# Patient Record
Sex: Female | Born: 1970 | Race: White | Hispanic: No | Smoking: Current every day smoker
Health system: Southern US, Community
[De-identification: ages and names within clinical notes are randomized; demographics above are authoritative.]

## PROBLEM LIST (undated history)

## (undated) DIAGNOSIS — I219 Acute myocardial infarction, unspecified: Secondary | ICD-10-CM

## (undated) DIAGNOSIS — M797 Fibromyalgia: Secondary | ICD-10-CM

## (undated) DIAGNOSIS — N6320 Unspecified lump in the left breast, unspecified quadrant: Secondary | ICD-10-CM

## (undated) DIAGNOSIS — C229 Malignant neoplasm of liver, not specified as primary or secondary: Secondary | ICD-10-CM

## (undated) DIAGNOSIS — M1990 Unspecified osteoarthritis, unspecified site: Secondary | ICD-10-CM

## (undated) DIAGNOSIS — F319 Bipolar disorder, unspecified: Secondary | ICD-10-CM

## (undated) DIAGNOSIS — Z8659 Personal history of other mental and behavioral disorders: Secondary | ICD-10-CM

## (undated) DIAGNOSIS — K219 Gastro-esophageal reflux disease without esophagitis: Secondary | ICD-10-CM

## (undated) DIAGNOSIS — I10 Essential (primary) hypertension: Secondary | ICD-10-CM

## (undated) DIAGNOSIS — I2510 Atherosclerotic heart disease of native coronary artery without angina pectoris: Secondary | ICD-10-CM

## (undated) DIAGNOSIS — D499 Neoplasm of unspecified behavior of unspecified site: Secondary | ICD-10-CM

## (undated) DIAGNOSIS — C569 Malignant neoplasm of unspecified ovary: Secondary | ICD-10-CM

## (undated) DIAGNOSIS — F4310 Post-traumatic stress disorder, unspecified: Secondary | ICD-10-CM

## (undated) DIAGNOSIS — C3490 Malignant neoplasm of unspecified part of unspecified bronchus or lung: Secondary | ICD-10-CM

## (undated) DIAGNOSIS — K259 Gastric ulcer, unspecified as acute or chronic, without hemorrhage or perforation: Secondary | ICD-10-CM

## (undated) HISTORY — DX: Gastro-esophageal reflux disease without esophagitis: K21.9

## (undated) HISTORY — DX: Malignant neoplasm of unspecified ovary: C56.9

## (undated) HISTORY — PX: HAND SURGERY: SHX662

---

## 1998-03-15 ENCOUNTER — Emergency Department (HOSPITAL_COMMUNITY): Admission: EM | Admit: 1998-03-15 | Discharge: 1998-03-15 | Payer: Self-pay | Admitting: Emergency Medicine

## 1998-10-26 ENCOUNTER — Emergency Department (HOSPITAL_COMMUNITY): Admission: EM | Admit: 1998-10-26 | Discharge: 1998-10-26 | Payer: Self-pay | Admitting: Emergency Medicine

## 2000-09-13 ENCOUNTER — Encounter: Payer: Self-pay | Admitting: Emergency Medicine

## 2000-09-13 ENCOUNTER — Emergency Department (HOSPITAL_COMMUNITY): Admission: EM | Admit: 2000-09-13 | Discharge: 2000-09-13 | Payer: Self-pay | Admitting: Emergency Medicine

## 2000-09-20 ENCOUNTER — Encounter: Payer: Self-pay | Admitting: Internal Medicine

## 2000-09-20 ENCOUNTER — Ambulatory Visit (HOSPITAL_COMMUNITY): Admission: RE | Admit: 2000-09-20 | Discharge: 2000-09-20 | Payer: Self-pay | Admitting: Internal Medicine

## 2000-09-28 ENCOUNTER — Encounter: Payer: Self-pay | Admitting: Internal Medicine

## 2000-09-28 ENCOUNTER — Ambulatory Visit (HOSPITAL_COMMUNITY): Admission: RE | Admit: 2000-09-28 | Discharge: 2000-09-28 | Payer: Self-pay | Admitting: Internal Medicine

## 2000-11-13 ENCOUNTER — Emergency Department (HOSPITAL_COMMUNITY): Admission: EM | Admit: 2000-11-13 | Discharge: 2000-11-13 | Payer: Self-pay | Admitting: *Deleted

## 2001-02-24 ENCOUNTER — Emergency Department (HOSPITAL_COMMUNITY): Admission: EM | Admit: 2001-02-24 | Discharge: 2001-02-24 | Payer: Self-pay | Admitting: Emergency Medicine

## 2001-02-24 ENCOUNTER — Encounter: Payer: Self-pay | Admitting: Emergency Medicine

## 2001-08-14 ENCOUNTER — Emergency Department (HOSPITAL_COMMUNITY): Admission: EM | Admit: 2001-08-14 | Discharge: 2001-08-14 | Payer: Self-pay | Admitting: Internal Medicine

## 2001-08-27 ENCOUNTER — Emergency Department (HOSPITAL_COMMUNITY): Admission: EM | Admit: 2001-08-27 | Discharge: 2001-08-27 | Payer: Self-pay | Admitting: Emergency Medicine

## 2002-05-31 ENCOUNTER — Emergency Department (HOSPITAL_COMMUNITY): Admission: EM | Admit: 2002-05-31 | Discharge: 2002-05-31 | Payer: Self-pay | Admitting: Emergency Medicine

## 2004-08-28 ENCOUNTER — Emergency Department (HOSPITAL_COMMUNITY): Admission: EM | Admit: 2004-08-28 | Discharge: 2004-08-28 | Payer: Self-pay | Admitting: Emergency Medicine

## 2006-02-08 ENCOUNTER — Emergency Department (HOSPITAL_COMMUNITY): Admission: EM | Admit: 2006-02-08 | Discharge: 2006-02-08 | Payer: Self-pay | Admitting: Emergency Medicine

## 2006-07-29 ENCOUNTER — Emergency Department (HOSPITAL_COMMUNITY): Admission: EM | Admit: 2006-07-29 | Discharge: 2006-07-29 | Payer: Self-pay | Admitting: Emergency Medicine

## 2006-07-31 ENCOUNTER — Emergency Department (HOSPITAL_COMMUNITY): Admission: EM | Admit: 2006-07-31 | Discharge: 2006-07-31 | Payer: Self-pay | Admitting: Emergency Medicine

## 2006-08-01 ENCOUNTER — Emergency Department (HOSPITAL_COMMUNITY): Admission: EM | Admit: 2006-08-01 | Discharge: 2006-08-01 | Payer: Self-pay | Admitting: Emergency Medicine

## 2009-07-24 HISTORY — PX: BREAST LUMPECTOMY: SHX2

## 2009-07-24 HISTORY — PX: ABDOMINAL HYSTERECTOMY: SHX81

## 2010-04-17 ENCOUNTER — Emergency Department (HOSPITAL_COMMUNITY): Admission: EM | Admit: 2010-04-17 | Discharge: 2010-04-17 | Payer: Self-pay | Admitting: Emergency Medicine

## 2010-04-22 ENCOUNTER — Emergency Department (HOSPITAL_COMMUNITY): Admission: EM | Admit: 2010-04-22 | Discharge: 2010-04-22 | Payer: Self-pay | Admitting: Emergency Medicine

## 2010-06-27 ENCOUNTER — Emergency Department (HOSPITAL_COMMUNITY)
Admission: EM | Admit: 2010-06-27 | Discharge: 2010-06-27 | Payer: Self-pay | Source: Home / Self Care | Admitting: Emergency Medicine

## 2010-07-19 ENCOUNTER — Emergency Department (HOSPITAL_COMMUNITY)
Admission: EM | Admit: 2010-07-19 | Discharge: 2010-07-19 | Payer: Self-pay | Source: Home / Self Care | Admitting: Emergency Medicine

## 2010-08-03 ENCOUNTER — Emergency Department (HOSPITAL_COMMUNITY)
Admission: EM | Admit: 2010-08-03 | Discharge: 2010-08-03 | Payer: Medicaid Other | Source: Home / Self Care | Admitting: Emergency Medicine

## 2010-08-08 LAB — DIFFERENTIAL
Basophils Absolute: 0.1 10*3/uL (ref 0.0–0.1)
Basophils Relative: 1 % (ref 0–1)
Eosinophils Absolute: 0.3 10*3/uL (ref 0.0–0.7)
Eosinophils Relative: 5 % (ref 0–5)
Lymphocytes Relative: 43 % (ref 12–46)
Lymphs Abs: 2.5 10*3/uL (ref 0.7–4.0)
Monocytes Absolute: 0.5 10*3/uL (ref 0.1–1.0)
Monocytes Relative: 8 % (ref 3–12)
Neutro Abs: 2.4 10*3/uL (ref 1.7–7.7)
Neutrophils Relative %: 43 % (ref 43–77)

## 2010-08-08 LAB — CBC
HCT: 36.1 % (ref 36.0–46.0)
Hemoglobin: 12.6 g/dL (ref 12.0–15.0)
MCH: 32.3 pg (ref 26.0–34.0)
MCHC: 34.9 g/dL (ref 30.0–36.0)
MCV: 92.6 fL (ref 78.0–100.0)
Platelets: 237 10*3/uL (ref 150–400)
RBC: 3.9 MIL/uL (ref 3.87–5.11)
RDW: 13.3 % (ref 11.5–15.5)
WBC: 5.7 10*3/uL (ref 4.0–10.5)

## 2010-08-08 LAB — COMPREHENSIVE METABOLIC PANEL
ALT: 24 U/L (ref 0–35)
AST: 21 U/L (ref 0–37)
Albumin: 3.6 g/dL (ref 3.5–5.2)
Alkaline Phosphatase: 58 U/L (ref 39–117)
BUN: 9 mg/dL (ref 6–23)
CO2: 28 mEq/L (ref 19–32)
Calcium: 8.7 mg/dL (ref 8.4–10.5)
Chloride: 107 mEq/L (ref 96–112)
Creatinine, Ser: 0.9 mg/dL (ref 0.4–1.2)
GFR calc Af Amer: >60 mL/min (ref >60)
GFR calc non Af Amer: >60 mL/min (ref >60)
Glucose, Bld: 85 mg/dL (ref 70–99)
Potassium: 3.8 mEq/L (ref 3.5–5.1)
Sodium: 142 mEq/L (ref 135–145)
Total Bilirubin: 0.3 mg/dL (ref 0.3–1.2)
Total Protein: 5.8 g/dL — ABNORMAL LOW (ref 6.0–8.3)

## 2010-08-08 LAB — POCT CARDIAC MARKERS
CKMB, poc: 1 ng/mL (ref 1.0–8.0)
Myoglobin, poc: 40.1 ng/mL (ref 12–200)
Troponin i, poc: <0.05 ng/mL (ref 0.00–0.09)

## 2010-08-25 ENCOUNTER — Emergency Department (HOSPITAL_COMMUNITY)
Admission: EM | Admit: 2010-08-25 | Discharge: 2010-08-25 | Disposition: A | Discharge status: Home / Self Care | Payer: Self-pay | Attending: Emergency Medicine | Admitting: Emergency Medicine

## 2010-08-25 ENCOUNTER — Emergency Department (HOSPITAL_COMMUNITY): Admit: 2010-08-25 | Discharge: 2010-08-25 | Disposition: A | Discharge status: Home / Self Care | Payer: Self-pay

## 2010-08-25 DIAGNOSIS — IMO0001 Reserved for inherently not codable concepts without codable children: Secondary | ICD-10-CM | POA: Insufficient documentation

## 2010-08-25 DIAGNOSIS — G43909 Migraine, unspecified, not intractable, without status migrainosus: Secondary | ICD-10-CM | POA: Insufficient documentation

## 2010-08-25 DIAGNOSIS — M069 Rheumatoid arthritis, unspecified: Secondary | ICD-10-CM | POA: Insufficient documentation

## 2010-08-25 DIAGNOSIS — F4310 Post-traumatic stress disorder, unspecified: Secondary | ICD-10-CM | POA: Insufficient documentation

## 2010-08-25 DIAGNOSIS — M329 Systemic lupus erythematosus, unspecified: Secondary | ICD-10-CM | POA: Insufficient documentation

## 2010-08-25 DIAGNOSIS — Z79899 Other long term (current) drug therapy: Secondary | ICD-10-CM | POA: Insufficient documentation

## 2010-09-26 ENCOUNTER — Emergency Department (HOSPITAL_COMMUNITY)
Admission: EM | Admit: 2010-09-26 | Discharge: 2010-09-26 | Disposition: A | Discharge status: Home / Self Care | Payer: Self-pay | Attending: Emergency Medicine | Admitting: Emergency Medicine

## 2010-09-26 DIAGNOSIS — M329 Systemic lupus erythematosus, unspecified: Secondary | ICD-10-CM | POA: Insufficient documentation

## 2010-09-26 DIAGNOSIS — IMO0001 Reserved for inherently not codable concepts without codable children: Secondary | ICD-10-CM | POA: Insufficient documentation

## 2010-09-26 DIAGNOSIS — Z79899 Other long term (current) drug therapy: Secondary | ICD-10-CM | POA: Insufficient documentation

## 2010-09-26 DIAGNOSIS — G43909 Migraine, unspecified, not intractable, without status migrainosus: Secondary | ICD-10-CM | POA: Insufficient documentation

## 2010-10-19 ENCOUNTER — Emergency Department (HOSPITAL_COMMUNITY): Payer: Self-pay

## 2010-10-19 ENCOUNTER — Emergency Department (HOSPITAL_COMMUNITY)
Admission: EM | Admit: 2010-10-19 | Discharge: 2010-10-19 | Disposition: A | Discharge status: Home / Self Care | Payer: Self-pay | Attending: Emergency Medicine | Admitting: Emergency Medicine

## 2010-10-19 DIAGNOSIS — R079 Chest pain, unspecified: Secondary | ICD-10-CM | POA: Insufficient documentation

## 2010-10-19 DIAGNOSIS — IMO0001 Reserved for inherently not codable concepts without codable children: Secondary | ICD-10-CM | POA: Insufficient documentation

## 2010-10-19 DIAGNOSIS — F411 Generalized anxiety disorder: Secondary | ICD-10-CM | POA: Insufficient documentation

## 2010-10-19 DIAGNOSIS — R911 Solitary pulmonary nodule: Secondary | ICD-10-CM | POA: Insufficient documentation

## 2010-10-19 DIAGNOSIS — J40 Bronchitis, not specified as acute or chronic: Secondary | ICD-10-CM | POA: Insufficient documentation

## 2010-10-19 LAB — CBC
HCT: 40.1 % (ref 36.0–46.0)
Hemoglobin: 13.7 g/dL (ref 12.0–15.0)
MCH: 32.2 pg (ref 26.0–34.0)
MCHC: 34.2 g/dL (ref 30.0–36.0)
MCV: 94.4 fL (ref 78.0–100.0)
Platelets: 382 10*3/uL (ref 150–400)
RBC: 4.25 MIL/uL (ref 3.87–5.11)
RDW: 13.1 % (ref 11.5–15.5)
WBC: 6.8 10*3/uL (ref 4.0–10.5)

## 2010-10-19 LAB — HEPATIC FUNCTION PANEL
ALT: 11 U/L (ref 0–35)
AST: 9 U/L (ref 0–37)
Albumin: 3.7 g/dL (ref 3.5–5.2)
Alkaline Phosphatase: 61 U/L (ref 39–117)
Bilirubin, Direct: 0.1 mg/dL (ref 0.0–0.3)
Indirect Bilirubin: 0.2 mg/dL — ABNORMAL LOW (ref 0.3–0.9)
Total Bilirubin: 0.3 mg/dL (ref 0.3–1.2)
Total Protein: 5.9 g/dL — ABNORMAL LOW (ref 6.0–8.3)

## 2010-10-19 LAB — URINALYSIS, ROUTINE W REFLEX MICROSCOPIC
Bilirubin Urine: NEGATIVE
Glucose, UA: NEGATIVE mg/dL
Hgb urine dipstick: NEGATIVE
Ketones, ur: NEGATIVE mg/dL
Nitrite: NEGATIVE
Protein, ur: NEGATIVE mg/dL
Specific Gravity, Urine: 1.025 (ref 1.005–1.030)
Urobilinogen, UA: 0.2 mg/dL (ref 0.0–1.0)
pH: 5.5 (ref 5.0–8.0)

## 2010-10-19 LAB — POCT CARDIAC MARKERS
CKMB, poc: <1 ng/mL — ABNORMAL LOW (ref 1.0–8.0)
Myoglobin, poc: 34.9 ng/mL (ref 12–200)
Troponin i, poc: <0.05 ng/mL (ref 0.00–0.09)

## 2010-10-19 LAB — DIFFERENTIAL
Basophils Absolute: 0.1 10*3/uL (ref 0.0–0.1)
Basophils Relative: 2 % — ABNORMAL HIGH (ref 0–1)
Eosinophils Absolute: 0.5 10*3/uL (ref 0.0–0.7)
Eosinophils Relative: 7 % — ABNORMAL HIGH (ref 0–5)
Lymphocytes Relative: 41 % (ref 12–46)
Lymphs Abs: 2.8 10*3/uL (ref 0.7–4.0)
Monocytes Absolute: 0.7 10*3/uL (ref 0.1–1.0)
Monocytes Relative: 10 % (ref 3–12)
Neutro Abs: 2.7 10*3/uL (ref 1.7–7.7)
Neutrophils Relative %: 40 % — ABNORMAL LOW (ref 43–77)

## 2010-10-19 LAB — BASIC METABOLIC PANEL
BUN: 16 mg/dL (ref 6–23)
CO2: 27 mEq/L (ref 19–32)
Calcium: 8.9 mg/dL (ref 8.4–10.5)
Chloride: 105 mEq/L (ref 96–112)
Creatinine, Ser: 1.1 mg/dL (ref 0.4–1.2)
GFR calc Af Amer: >60 mL/min (ref >60)
GFR calc non Af Amer: 55 mL/min — ABNORMAL LOW (ref >60)
Glucose, Bld: 75 mg/dL (ref 70–99)
Potassium: 3.6 mEq/L (ref 3.5–5.1)
Sodium: 139 mEq/L (ref 135–145)

## 2010-10-19 LAB — LIPASE, BLOOD: Lipase: 23 U/L (ref 11–59)

## 2010-10-19 LAB — CK TOTAL AND CKMB (NOT AT ARMC)
CK, MB: 1.3 ng/mL (ref 0.3–4.0)
Relative Index: INVALID (ref 0.0–2.5)
Total CK: 36 U/L (ref 7–177)

## 2010-10-19 LAB — TROPONIN I: Troponin I: 0.01 ng/mL (ref 0.00–0.06)

## 2010-10-19 MED ORDER — IOHEXOL 350 MG/ML SOLN
100.0000 mL | Freq: Once | INTRAVENOUS | Status: AC | PRN
  Administered 2010-10-19: 100 mL via INTRAVENOUS

## 2010-12-09 NOTE — Assessment & Plan Note (Signed)
Friday, June 22, 2006:   This is a followup bilateral hamstring Botox injection, as well as pain  management related to lower extremity spasticity as well as migraine  headaches. She did well with the Botox injection, in terms of relieving  lower extremity spasms. She feels a little funny in terms of her  walking, but she has had no falls.   She has had no medical complications. She had a prescription for 32 mg  of Dilaudid dated May 04, 2006. She just ran out a week ago. She has  been reducing her consumption secondary to the beneficial effect in  Botox this far.   Pain level about 5-6 out of 10. Sleep continues to be poor. She can walk  40 minutes at a time. No multiple sclerosis flare-ups in the interval  time. Independent with self-care and mobility.   EXAMINATION:  General: No acute distress. Mood and affect appropriate.  [place] 1 for the hamstrings.   She has weakness of the right ankle dorsi flexor as well as right left  flexor compared to the left.   Upper extremity strength is good. Gait does have some right foot drag.   IMPRESSION:  1. Multiple sclerosis with hamstring spasticity, improved. Some mild      increase in weakness following Botox injection.  2. Right foot drop. Recommend AFO.  3. Pain related to her spasticity as well as headaches, intermittent      low-dose Dilaudid, 30 tablets last her four to six weeks. Will      refill Dilaudid 2 mg #30. I will see her back the beginning of      January.      Erick Colace, M.D.  Electronically Signed     AEK/MedQ  D:  06/22/2006 08:59:15  T:  06/22/2006 10:57:29  Job #:  [id number]   cc:   Casimiro Needle L. Thad Ranger, M.D.  Fax: [phone]   Dalbert Mayotte, M.D.

## 2011-01-04 ENCOUNTER — Emergency Department (HOSPITAL_COMMUNITY)
Admission: EM | Admit: 2011-01-04 | Discharge: 2011-01-04 | Disposition: A | Discharge status: Home / Self Care | Payer: Medicaid Other | Attending: Emergency Medicine | Admitting: Emergency Medicine

## 2011-01-04 DIAGNOSIS — R609 Edema, unspecified: Secondary | ICD-10-CM | POA: Insufficient documentation

## 2011-01-04 DIAGNOSIS — M329 Systemic lupus erythematosus, unspecified: Secondary | ICD-10-CM | POA: Insufficient documentation

## 2011-01-04 DIAGNOSIS — I252 Old myocardial infarction: Secondary | ICD-10-CM | POA: Insufficient documentation

## 2011-01-04 DIAGNOSIS — X118XXA Contact with other hot tap-water, initial encounter: Secondary | ICD-10-CM | POA: Insufficient documentation

## 2011-01-04 DIAGNOSIS — S59909A Unspecified injury of unspecified elbow, initial encounter: Secondary | ICD-10-CM | POA: Insufficient documentation

## 2011-01-04 DIAGNOSIS — T23179A Burn of first degree of unspecified wrist, initial encounter: Secondary | ICD-10-CM | POA: Insufficient documentation

## 2011-01-04 DIAGNOSIS — T23169A Burn of first degree of back of unspecified hand, initial encounter: Secondary | ICD-10-CM | POA: Insufficient documentation

## 2011-01-04 DIAGNOSIS — M069 Rheumatoid arthritis, unspecified: Secondary | ICD-10-CM | POA: Insufficient documentation

## 2011-01-04 DIAGNOSIS — S6990XA Unspecified injury of unspecified wrist, hand and finger(s), initial encounter: Secondary | ICD-10-CM | POA: Insufficient documentation

## 2011-01-06 ENCOUNTER — Emergency Department (HOSPITAL_COMMUNITY)
Admission: EM | Admit: 2011-01-06 | Discharge: 2011-01-06 | Disposition: A | Discharge status: Home / Self Care | Payer: Medicaid Other | Attending: Emergency Medicine | Admitting: Emergency Medicine

## 2011-01-06 DIAGNOSIS — Y998 Other external cause status: Secondary | ICD-10-CM | POA: Insufficient documentation

## 2011-01-06 DIAGNOSIS — X088XXA Exposure to other specified smoke, fire and flames, initial encounter: Secondary | ICD-10-CM | POA: Insufficient documentation

## 2011-01-06 DIAGNOSIS — T23109A Burn of first degree of unspecified hand, unspecified site, initial encounter: Secondary | ICD-10-CM | POA: Insufficient documentation

## 2011-01-07 ENCOUNTER — Emergency Department (HOSPITAL_COMMUNITY)
Admission: EM | Admit: 2011-01-07 | Discharge: 2011-01-07 | Disposition: A | Discharge status: Home / Self Care | Payer: Medicaid Other | Attending: Emergency Medicine | Admitting: Emergency Medicine

## 2011-01-07 DIAGNOSIS — IMO0001 Reserved for inherently not codable concepts without codable children: Secondary | ICD-10-CM | POA: Insufficient documentation

## 2011-01-07 DIAGNOSIS — IMO0002 Reserved for concepts with insufficient information to code with codable children: Secondary | ICD-10-CM | POA: Insufficient documentation

## 2011-01-07 DIAGNOSIS — Z882 Allergy status to sulfonamides status: Secondary | ICD-10-CM | POA: Insufficient documentation

## 2011-01-07 DIAGNOSIS — Z888 Allergy status to other drugs, medicaments and biological substances status: Secondary | ICD-10-CM | POA: Insufficient documentation

## 2011-01-07 DIAGNOSIS — F411 Generalized anxiety disorder: Secondary | ICD-10-CM | POA: Insufficient documentation

## 2011-01-07 DIAGNOSIS — T23009A Burn of unspecified degree of unspecified hand, unspecified site, initial encounter: Secondary | ICD-10-CM | POA: Insufficient documentation

## 2011-01-07 DIAGNOSIS — F17200 Nicotine dependence, unspecified, uncomplicated: Secondary | ICD-10-CM | POA: Insufficient documentation

## 2011-01-07 DIAGNOSIS — I252 Old myocardial infarction: Secondary | ICD-10-CM | POA: Insufficient documentation

## 2011-01-07 DIAGNOSIS — Z9079 Acquired absence of other genital organ(s): Secondary | ICD-10-CM | POA: Insufficient documentation

## 2011-01-07 DIAGNOSIS — X118XXA Contact with other hot tap-water, initial encounter: Secondary | ICD-10-CM | POA: Insufficient documentation

## 2011-01-10 ENCOUNTER — Emergency Department (HOSPITAL_COMMUNITY)
Admission: EM | Admit: 2011-01-10 | Discharge: 2011-01-10 | Disposition: A | Discharge status: Home / Self Care | Payer: Medicaid Other | Attending: Emergency Medicine | Admitting: Emergency Medicine

## 2011-01-10 DIAGNOSIS — IMO0001 Reserved for inherently not codable concepts without codable children: Secondary | ICD-10-CM | POA: Insufficient documentation

## 2011-01-10 DIAGNOSIS — F411 Generalized anxiety disorder: Secondary | ICD-10-CM | POA: Insufficient documentation

## 2011-01-10 DIAGNOSIS — T23109A Burn of first degree of unspecified hand, unspecified site, initial encounter: Secondary | ICD-10-CM | POA: Insufficient documentation

## 2011-01-10 DIAGNOSIS — T310 Burns involving less than 10% of body surface: Secondary | ICD-10-CM | POA: Insufficient documentation

## 2011-01-10 DIAGNOSIS — G43909 Migraine, unspecified, not intractable, without status migrainosus: Secondary | ICD-10-CM | POA: Insufficient documentation

## 2011-01-10 DIAGNOSIS — X088XXA Exposure to other specified smoke, fire and flames, initial encounter: Secondary | ICD-10-CM | POA: Insufficient documentation

## 2011-01-10 DIAGNOSIS — M069 Rheumatoid arthritis, unspecified: Secondary | ICD-10-CM | POA: Insufficient documentation

## 2011-01-10 DIAGNOSIS — F17200 Nicotine dependence, unspecified, uncomplicated: Secondary | ICD-10-CM | POA: Insufficient documentation

## 2011-01-10 DIAGNOSIS — F1911 Other psychoactive substance abuse, in remission: Secondary | ICD-10-CM | POA: Insufficient documentation

## 2011-01-10 DIAGNOSIS — IMO0002 Reserved for concepts with insufficient information to code with codable children: Secondary | ICD-10-CM | POA: Insufficient documentation

## 2011-01-10 DIAGNOSIS — F4310 Post-traumatic stress disorder, unspecified: Secondary | ICD-10-CM | POA: Insufficient documentation

## 2011-02-02 ENCOUNTER — Emergency Department: Payer: Self-pay | Admitting: Emergency Medicine

## 2011-02-08 ENCOUNTER — Encounter: Payer: Self-pay | Admitting: *Deleted

## 2011-02-08 ENCOUNTER — Emergency Department (HOSPITAL_COMMUNITY)
Admission: EM | Admit: 2011-02-08 | Discharge: 2011-02-08 | Disposition: A | Discharge status: Home / Self Care | Payer: Medicaid Other | Attending: Emergency Medicine | Admitting: Emergency Medicine

## 2011-02-08 DIAGNOSIS — R5381 Other malaise: Secondary | ICD-10-CM | POA: Insufficient documentation

## 2011-02-08 DIAGNOSIS — R5383 Other fatigue: Secondary | ICD-10-CM | POA: Insufficient documentation

## 2011-02-08 DIAGNOSIS — F17200 Nicotine dependence, unspecified, uncomplicated: Secondary | ICD-10-CM | POA: Insufficient documentation

## 2011-02-08 DIAGNOSIS — F319 Bipolar disorder, unspecified: Secondary | ICD-10-CM | POA: Insufficient documentation

## 2011-02-08 DIAGNOSIS — Z765 Malingerer [conscious simulation]: Secondary | ICD-10-CM | POA: Insufficient documentation

## 2011-02-08 HISTORY — DX: Unspecified osteoarthritis, unspecified site: M19.90

## 2011-02-08 HISTORY — DX: Bipolar disorder, unspecified: F31.9

## 2011-02-08 HISTORY — DX: Post-traumatic stress disorder, unspecified: F43.10

## 2011-02-08 HISTORY — DX: Unspecified lump in the left breast, unspecified quadrant: N63.20

## 2011-02-08 HISTORY — DX: Fibromyalgia: M79.7

## 2011-02-08 LAB — URINALYSIS, ROUTINE W REFLEX MICROSCOPIC
Leukocytes, UA: NEGATIVE
Protein, ur: NEGATIVE mg/dL
Urobilinogen, UA: 0.2 mg/dL (ref 0.0–1.0)

## 2011-02-08 LAB — BASIC METABOLIC PANEL
Calcium: 8.9 mg/dL (ref 8.4–10.5)
GFR calc non Af Amer: >60 mL/min (ref >60)
Sodium: 140 mEq/L (ref 135–145)

## 2011-02-08 LAB — URINE MICROSCOPIC-ADD ON

## 2011-02-08 LAB — POCT PREGNANCY, URINE: Preg Test, Ur: NEGATIVE

## 2011-02-08 LAB — PREGNANCY, URINE: Preg Test, Ur: NEGATIVE

## 2011-02-08 LAB — ETHANOL: Alcohol, Ethyl (B): <11 mg/dL (ref 0–11)

## 2011-02-08 MED ORDER — ONDANSETRON HCL 4 MG PO TABS
4.0000 mg | ORAL_TABLET | Freq: Once | ORAL | Status: AC
  Administered 2011-02-08: 11:00:00 via ORAL

## 2011-02-08 MED ORDER — ONDANSETRON HCL 4 MG PO TABS
ORAL_TABLET | ORAL | Status: AC
  Filled 2011-02-08: qty 1

## 2011-02-08 MED ORDER — ACETAMINOPHEN 325 MG PO TABS
650.0000 mg | ORAL_TABLET | Freq: Once | ORAL | Status: AC
  Administered 2011-02-08: 650 mg via ORAL
  Filled 2011-02-08: qty 2

## 2011-02-08 MED ORDER — ONDANSETRON 8 MG PO TBDP
8.0000 mg | ORAL_TABLET | Freq: Once | ORAL | Status: AC
  Administered 2011-02-08: 8 mg via ORAL
  Filled 2011-02-08: qty 1

## 2011-02-08 MED ORDER — ARIPIPRAZOLE 5 MG PO TABS: 5.0000 mg | ORAL_TABLET | Freq: Every day | ORAL | Status: AC

## 2011-02-08 NOTE — ED Notes (Signed)
Pt states that she needs to be evaluated for inpatient treatment for her PTSD. Pt states that she has been off all her meds since getting out of [place] in April. Pt states that she has been nervous and feels like she is having a nervous breakdown. Pt admits to suicidal ideations but has no plan. Pt also c/o depression. Has not been able to eat or sleep and has lost weight.

## 2011-02-08 NOTE — ED Notes (Signed)
Specialists on call was contacted. Psychiatry consult was set up. Awaiting a return call.

## 2011-02-08 NOTE — ED Notes (Signed)
Telepsych consult completed. Pt upset at this time. Pt was informed she did not meet criteria for inpatient psychiatric treatment. Pt stated she was going outside to smoke. Explained to pt that this was non smoking facility and that was not allowed. Pt insisted she was going to smoke and she would be back when she was done. PT ambulated out of facility. Belongings remain in room. PA and charge nurse aware. PA awaiting report from psychiatrist.

## 2011-02-08 NOTE — ED Provider Notes (Signed)
History     Chief Complaint  Patient presents with  . Psychiatric Evaluation   HPI Comments: She describes having run out of of her psych meds the end of last month and is experiencing increased stress and depression.  She relates multiple medical diagnoses which have caused her great distress in addition to having ptsd since the death of an infant child and her boyfriend years ago.  She is currently married,  But not in a happy marriage,  Claiming husband has etoh problems.  She is wanting to be "voluntarily" committed today to get back on her psych meds.  She denies suicidal or homicidal ideation.  She is scheduled to see her provider at [facility] next week.  Patient is a 40 y.o. female presenting with mental health disorder. The history is provided by the patient.  Mental Health Problem The primary symptoms do not include hallucinations. The current episode started more than 1 month ago. This is a chronic problem.  The onset of the illness is precipitated by a stressful event and emotional stress. Additional symptoms of the illness include fatigue and feelings of worthlessness. Additional symptoms of the illness do not include no insomnia, no flight of ideas, not distractible, no poor judgment, no visual change, no headaches or no abdominal pain. She does not admit to suicidal ideas. She does not have a plan to commit suicide. She does not contemplate harming herself. She has not already injured self. She does not contemplate injuring another person. She has not already  injured another person.    Past Medical History  Diagnosis Date  . Bipolar 1 disorder   . PTSD (post-traumatic stress disorder)   . Fibromyalgia   . Mass of left breast   . Arthritis     Past Surgical History  Procedure Date  . Abdominal hysterectomy     History reviewed. No pertinent family history.  History  Substance Use Topics  . Smoking status: Current Everyday Smoker -- 1.0 packs/day    Types:  Cigarettes  . Smokeless tobacco: Not on file  . Alcohol Use: No    OB History    Grav Para Term Preterm Abortions TAB SAB Ect Mult Living                  Review of Systems  Constitutional: Positive for fatigue. Negative for fever.  HENT: Negative for congestion, sore throat and neck pain.   Eyes: Negative.   Respiratory: Negative for chest tightness and shortness of breath.   Cardiovascular: Negative for chest pain.  Gastrointestinal: Negative for nausea and abdominal pain.  Genitourinary: Negative.   Musculoskeletal: Negative for joint swelling and arthralgias.  Skin: Negative.  Negative for rash and wound.  Neurological: Negative for dizziness, tremors, weakness, light-headedness, numbness and headaches.  Hematological: Negative.   Psychiatric/Behavioral: Negative.  Negative for suicidal ideas, hallucinations and self-injury. The patient does not have insomnia.   All other systems reviewed and are negative.    Physical Exam  BP 98/56  Pulse 79  Temp(Src) 98.3 F (36.8 C) (Oral)  Resp 18  Ht 5' 2.5" (1.588 m)  Wt 110 lb (49.896 kg)  BMI 19.80 kg/m2  SpO2 98%  Physical Exam  Vitals reviewed. Constitutional: She is oriented to person, place, and time. She appears well-developed and well-nourished.  HENT:  Head: Normocephalic and atraumatic.  Eyes: Conjunctivae are normal.  Neck: Normal range of motion.  Cardiovascular: Normal rate, regular rhythm, normal heart sounds and intact distal pulses.  Pulmonary/Chest: Effort normal and breath sounds normal. She has no wheezes.  Abdominal: Soft. Bowel sounds are normal. There is no tenderness.  Musculoskeletal: Normal range of motion.  Neurological: She is alert and oriented to person, place, and time.  Skin: Skin is warm and dry.  Psychiatric: She has a normal mood and affect. Her speech is normal and behavior is normal. Thought content normal. Cognition and memory are normal.       Occasionally tearful during interview.     ED Course  Procedures  MDM   Telepsych services consulted and Debbora Presto,  MD evaluated patient with diagnosis of bipolar disorder and suspected malingering without suicidality or homicidality.  Safe for outpt tx.  Did recommend restarting abilify 5 mg po qd.        Candis Musa, PA 02/08/11 2109

## 2011-02-28 ENCOUNTER — Other Ambulatory Visit (HOSPITAL_COMMUNITY): Payer: Self-pay | Admitting: Family Medicine

## 2011-02-28 DIAGNOSIS — Z139 Encounter for screening, unspecified: Secondary | ICD-10-CM

## 2011-02-28 DIAGNOSIS — N6459 Other signs and symptoms in breast: Secondary | ICD-10-CM

## 2011-02-28 DIAGNOSIS — G8929 Other chronic pain: Secondary | ICD-10-CM

## 2011-03-11 ENCOUNTER — Emergency Department (HOSPITAL_COMMUNITY)
Admission: EM | Admit: 2011-03-11 | Discharge: 2011-03-11 | Disposition: A | Discharge status: Home / Self Care | Payer: Medicaid Other | Attending: Emergency Medicine | Admitting: Emergency Medicine

## 2011-03-11 DIAGNOSIS — L02519 Cutaneous abscess of unspecified hand: Secondary | ICD-10-CM | POA: Insufficient documentation

## 2011-03-11 DIAGNOSIS — L03119 Cellulitis of unspecified part of limb: Secondary | ICD-10-CM | POA: Insufficient documentation

## 2011-03-11 DIAGNOSIS — IMO0001 Reserved for inherently not codable concepts without codable children: Secondary | ICD-10-CM | POA: Insufficient documentation

## 2011-03-11 DIAGNOSIS — M069 Rheumatoid arthritis, unspecified: Secondary | ICD-10-CM | POA: Insufficient documentation

## 2011-03-11 DIAGNOSIS — S61409A Unspecified open wound of unspecified hand, initial encounter: Secondary | ICD-10-CM | POA: Insufficient documentation

## 2011-03-11 DIAGNOSIS — IMO0002 Reserved for concepts with insufficient information to code with codable children: Secondary | ICD-10-CM | POA: Insufficient documentation

## 2011-03-11 DIAGNOSIS — X58XXXA Exposure to other specified factors, initial encounter: Secondary | ICD-10-CM | POA: Insufficient documentation

## 2011-03-15 NOTE — ED Provider Notes (Signed)
Medical screening examination/treatment/procedure(s) were performed by non-physician practitioner and as supervising physician I was immediately available for consultation/collaboration.   Askari Kinley L Tationna Fullard, MD 03/15/11 0957 

## 2011-03-22 ENCOUNTER — Ambulatory Visit (HOSPITAL_COMMUNITY): Admission: RE | Admit: 2011-03-22 | Payer: Self-pay | Source: Ambulatory Visit

## 2011-04-21 ENCOUNTER — Emergency Department (HOSPITAL_COMMUNITY): Payer: Medicaid Other

## 2011-04-21 ENCOUNTER — Encounter (HOSPITAL_COMMUNITY): Payer: Self-pay | Admitting: *Deleted

## 2011-04-21 ENCOUNTER — Other Ambulatory Visit: Payer: Self-pay

## 2011-04-21 ENCOUNTER — Emergency Department (HOSPITAL_COMMUNITY)
Admission: EM | Admit: 2011-04-21 | Discharge: 2011-04-22 | Disposition: A | Discharge status: Home / Self Care | Payer: Medicaid Other | Attending: Emergency Medicine | Admitting: Emergency Medicine

## 2011-04-21 DIAGNOSIS — F4310 Post-traumatic stress disorder, unspecified: Secondary | ICD-10-CM | POA: Insufficient documentation

## 2011-04-21 DIAGNOSIS — Z79899 Other long term (current) drug therapy: Secondary | ICD-10-CM | POA: Insufficient documentation

## 2011-04-21 DIAGNOSIS — F17200 Nicotine dependence, unspecified, uncomplicated: Secondary | ICD-10-CM | POA: Insufficient documentation

## 2011-04-21 DIAGNOSIS — IMO0001 Reserved for inherently not codable concepts without codable children: Secondary | ICD-10-CM | POA: Insufficient documentation

## 2011-04-21 DIAGNOSIS — F319 Bipolar disorder, unspecified: Secondary | ICD-10-CM | POA: Insufficient documentation

## 2011-04-21 DIAGNOSIS — R0789 Other chest pain: Secondary | ICD-10-CM

## 2011-04-21 DIAGNOSIS — R51 Headache: Secondary | ICD-10-CM | POA: Insufficient documentation

## 2011-04-21 DIAGNOSIS — I252 Old myocardial infarction: Secondary | ICD-10-CM | POA: Insufficient documentation

## 2011-04-21 DIAGNOSIS — R079 Chest pain, unspecified: Secondary | ICD-10-CM | POA: Insufficient documentation

## 2011-04-21 LAB — CBC
Hemoglobin: 13.6 g/dL (ref 12.0–15.0)
MCH: 32.1 pg (ref 26.0–34.0)
MCV: 95.3 fL (ref 78.0–100.0)
RBC: 4.24 MIL/uL (ref 3.87–5.11)

## 2011-04-21 LAB — DIFFERENTIAL
Eosinophils Absolute: 0.7 10*3/uL (ref 0.0–0.7)
Lymphocytes Relative: 44 % (ref 12–46)
Lymphs Abs: 3.5 10*3/uL (ref 0.7–4.0)
Monocytes Relative: 8 % (ref 3–12)
Neutrophils Relative %: 38 % — ABNORMAL LOW (ref 43–77)

## 2011-04-21 LAB — BASIC METABOLIC PANEL
BUN: 11 mg/dL (ref 6–23)
CO2: 31 mEq/L (ref 19–32)
GFR calc non Af Amer: >60 mL/min (ref >60)
Glucose, Bld: 90 mg/dL (ref 70–99)
Potassium: 3.8 mEq/L (ref 3.5–5.1)

## 2011-04-21 LAB — POCT I-STAT TROPONIN I: Troponin i, poc: 0 ng/mL (ref 0.00–0.08)

## 2011-04-21 MED ORDER — ONDANSETRON HCL 4 MG/2ML IJ SOLN
4.0000 mg | Freq: Once | INTRAMUSCULAR | Status: AC
  Administered 2011-04-21: 4 mg via INTRAVENOUS
  Filled 2011-04-21: qty 2

## 2011-04-21 MED ORDER — MORPHINE SULFATE 4 MG/ML IJ SOLN
4.0000 mg | Freq: Once | INTRAMUSCULAR | Status: AC
  Administered 2011-04-21: 4 mg via INTRAVENOUS
  Filled 2011-04-21: qty 1

## 2011-04-21 NOTE — ED Notes (Signed)
Chest pain for 2 days,  Headache for 1 week.

## 2011-04-21 NOTE — ED Notes (Signed)
Pt states has had chest pain with sob  x 2 days, denies N/V  Pt reports pain moving to upper lt arm and jaw pain "a couple of times over past 2 days". Pt states feels like "someone is squeezing my heart". Denies diaphoresis with these pains. Pt also complaining of headache at this time.

## 2011-04-21 NOTE — ED Notes (Signed)
hematuria

## 2011-04-21 NOTE — ED Provider Notes (Addendum)
History     CSN: [id number] Arrival date & time: 04/21/2011  9:33 PM  Chief Complaint  Patient presents with  . Chest Pain    (Consider location/radiation/quality/duration/timing/severity/associated sxs/prior treatment) Patient is a 40 y.o. female presenting with chest pain. The history is provided by the patient.  Chest Pain The chest pain began yesterday. Duration of episode(s) is 1 minute. Chest pain occurs intermittently. The chest pain is resolved. Associated with: headache. At its most intense, the pain is at 5/10. The pain is currently at 3/10. The severity of the pain is moderate. The quality of the pain is described as squeezing. Radiates to: She reports 3 episodes of chest pain at rest today,  each lasting 1 minute or less.  The last episode occured 2 hours ago and radiated to her left elbow. Pertinent negatives for primary symptoms include no fever, no shortness of breath, no cough, no wheezing, no nausea, no vomiting and no dizziness.  Pertinent negatives for associated symptoms include no diaphoresis, no orthopnea and no weakness. Associated symptoms comments: She also reports headache for the past week,  Described as "all over pressure" and unlike her previous migraine headaches.  She does report increased financial stressors she feels may be contributing. . She tried nothing for the symptoms.  Her past medical history is significant for MI. Past medical history comments: She reports history of "mild" MI 4 years ago when living in [place].     Past Medical History  Diagnosis Date  . Bipolar 1 disorder   . PTSD (post-traumatic stress disorder)   . Fibromyalgia   . Mass of left breast   . Arthritis     Past Surgical History  Procedure Date  . Abdominal hysterectomy     History reviewed. No pertinent family history.  History  Substance Use Topics  . Smoking status: Current Everyday Smoker -- 1.0 packs/day    Types: Cigarettes  . Smokeless tobacco: Not on file  .  Alcohol Use: No    OB History    Grav Para Term Preterm Abortions TAB SAB Ect Mult Living                  Review of Systems  Constitutional: Negative for fever and diaphoresis.  Respiratory: Negative for cough, shortness of breath and wheezing.   Cardiovascular: Positive for chest pain. Negative for orthopnea.  Gastrointestinal: Negative for nausea and vomiting.  Neurological: Negative for dizziness and weakness.    Allergies  Aspirin; Haldol; Nsaids; Sulfa antibiotics; and Ultram  Home Medications   Current Outpatient Rx  Name Route Sig Dispense Refill  . ALPRAZOLAM 1 MG PO TABS Oral Take 1 mg by mouth 2 (two) times daily.      . CHLORPHEN-PE-ACETAMINOPHEN 2-5-325 MG PO TABS Oral Take 1 tablet by mouth once as needed. For cough     . ESTRADIOL 0.5 MG PO TABS Oral Take 0.5 mg by mouth daily.      Marland Kitchen GLUCOSAMINE PO Oral Take 1 tablet by mouth daily.      Marland Kitchen LISINOPRIL 5 MG PO TABS Oral Take 5 mg by mouth daily.      Carma Leaven M PLUS PO TABS Oral Take 1 tablet by mouth daily.      . OXYCODONE HCL 30 MG PO TABS Oral Take 30 mg by mouth 3 (three) times daily.      Marland Kitchen PROMETHAZINE HCL 25 MG PO TABS Oral Take 25 mg by mouth every 6 (six) hours as needed.      Marland Kitchen  TRAZODONE HCL 50 MG PO TABS Oral Take 50 mg by mouth at bedtime. For sleep     . ARIPIPRAZOLE 10 MG PO TABS Oral Take 10 mg by mouth 2 (two) times daily.      Marland Kitchen GABAPENTIN 300 MG PO CAPS Oral Take 300 mg by mouth 3 (three) times daily.      Marland Kitchen MEPERIDINE HCL 50 MG PO TABS Oral Take 50 mg by mouth every 4 (four) hours as needed.        BP 111/57  Pulse 83  Temp(Src) 98.4 F (36.9 C) (Oral)  Resp 18  Ht 5\' 2"  (1.575 m)  Wt 108 lb (48.988 kg)  BMI 19.75 kg/m2  SpO2 98%  Physical Exam  Nursing note and vitals reviewed. Constitutional: She is oriented to person, place, and time. She appears well-developed and well-nourished.  HENT:  Head: Normocephalic and atraumatic.  Mouth/Throat: Oropharynx is clear and moist.  Eyes:  Conjunctivae and EOM are normal. Pupils are equal, round, and reactive to light.  Neck: Normal range of motion. Neck supple.  Cardiovascular: Normal rate, regular rhythm, normal heart sounds and intact distal pulses.   Pulmonary/Chest: Effort normal and breath sounds normal. She has no wheezes.  Abdominal: Soft. Bowel sounds are normal. There is no tenderness.  Musculoskeletal: Normal range of motion.  Lymphadenopathy:    She has no cervical adenopathy.  Neurological: She is alert and oriented to person, place, and time. She has normal strength. No cranial nerve deficit or sensory deficit. She displays a negative Romberg sign. Gait normal. GCS eye subscore is 4. GCS verbal subscore is 5. GCS motor subscore is 6.       Normal heel-shin, normal rapid alternating movements.  Skin: Skin is warm and dry. No rash noted.  Psychiatric: She has a normal mood and affect. Her speech is normal and behavior is normal. Thought content normal. Cognition and memory are normal.    ED Course  Procedures (including critical care time)   Labs Reviewed  POCT I-STAT TROPONIN I    Results for orders placed during the hospital encounter of 04/21/11  POCT I-STAT TROPONIN I      Component Value Range   Troponin i, poc 0.00  0.00 - 0.08 (ng/mL)   Comment 3           CARDIAC PANEL(CRET KIN+CKTOT+MB+TROPI)      Component Value Range   Total CK 48  7 - 177 (U/L)   CK, MB 1.7  0.3 - 4.0 (ng/mL)   Troponin I <0.30  <0.30 (ng/mL)   Relative Index RELATIVE INDEX IS INVALID  0.0 - 2.5   CBC      Component Value Range   WBC 8.0  4.0 - 10.5 (K/uL)   RBC 4.24  3.87 - 5.11 (MIL/uL)   Hemoglobin 13.6  12.0 - 15.0 (g/dL)   HCT 19.1  47.8 - 29.5 (%)   MCV 95.3  78.0 - 100.0 (fL)   MCH 32.1  26.0 - 34.0 (pg)   MCHC 33.7  30.0 - 36.0 (g/dL)   RDW 62.1  30.8 - 65.7 (%)   Platelets 349  150 - 400 (K/uL)  DIFFERENTIAL      Component Value Range   Neutrophils Relative 38 (*) 43 - 77 (%)   Neutro Abs 3.1  1.7 - 7.7  (K/uL)   Lymphocytes Relative 44  12 - 46 (%)   Lymphs Abs 3.5  0.7 - 4.0 (K/uL)   Monocytes Relative 8  3 - 12 (%)   Monocytes Absolute 0.6  0.1 - 1.0 (K/uL)   Eosinophils Relative 9 (*) 0 - 5 (%)   Eosinophils Absolute 0.7  0.0 - 0.7 (K/uL)   Basophils Relative 1  0 - 1 (%)   Basophils Absolute 0.1  0.0 - 0.1 (K/uL)  BASIC METABOLIC PANEL      Component Value Range   Sodium 139  135 - 145 (mEq/L)   Potassium 3.8  3.5 - 5.1 (mEq/L)   Chloride 103  96 - 112 (mEq/L)   CO2 31  19 - 32 (mEq/L)   Glucose, Bld 90  70 - 99 (mg/dL)   BUN 11  6 - 23 (mg/dL)   Creatinine, Ser 1.61  0.50 - 1.10 (mg/dL)   Calcium 9.5  8.4 - 09.6 (mg/dL)   GFR calc non Af Amer >60  >60 (mL/min)   GFR calc Af Amer >60  >60 (mL/min)   Dg Chest Portable 1 View  04/21/2011  *RADIOLOGY REPORT*  Clinical Data: Chest pain, shortness of breath  PORTABLE CHEST - 1 VIEW  Comparison: CT dated 10/18/2009  Findings: Left lower lobe nodule/spiculated opacity.  Emphysematous changes. No pleural effusion or pneumothorax.  Abnormal left mediastinal contour, corresponding to known adenopathy from prior CT.  IMPRESSION: Left lower lobe nodule/spiculated opacity with left mediastinal lymphadenopathy, worrisome for neoplasm.  Original Report Authenticated By: Charline Bills, M.D.       MDM  Discussed cxr results with patient, and review of old records revealing same cxr results and Ct chest completed in 3/12 showing same lung mass.  Patient states has known liver and breast mass,  Has been waiting for her disability to get approved which starts in 2 days.  Her pcp with [facility] is arranging followup studies for these known problems.    Nonfocal neuro exam - suspect simple stress headache.  Chest pain resolved in ed,  Atypical for cardiac with intermittent brief episodes since yesterday,  Not exertional,  Normal cardiac markers and ekg.   Date: 04/22/2011  Rate: 74  Rhythm: normal sinus rhythm  QRS Axis: normal  Intervals:  normal  ST/T Wave abnormalities: normal  Conduction Disutrbances:none  Narrative Interpretation:   Old EKG Reviewed: unchanged          Candis Musa, PA 04/22/11 0100  Candis Musa, PA 04/22/11 [phone]

## 2011-04-22 LAB — CARDIAC PANEL(CRET KIN+CKTOT+MB+TROPI): Troponin I: <0.3 ng/mL (ref <0.30)

## 2011-04-22 MED ORDER — HYDROMORPHONE HCL 1 MG/ML IJ SOLN
1.0000 mg | Freq: Once | INTRAMUSCULAR | Status: DC
  Filled 2011-04-22: qty 1

## 2011-04-22 MED ORDER — ONDANSETRON HCL 4 MG/2ML IJ SOLN
4.0000 mg | Freq: Once | INTRAMUSCULAR | Status: AC
  Administered 2011-04-22: 4 mg via INTRAVENOUS
  Filled 2011-04-22: qty 2

## 2011-04-22 MED ORDER — HYDROMORPHONE HCL 1 MG/ML IJ SOLN
1.0000 mg | Freq: Once | INTRAMUSCULAR | Status: AC
  Administered 2011-04-22: 1 mg via INTRAVENOUS

## 2011-04-22 NOTE — ED Provider Notes (Signed)
Medical screening examination/treatment/procedure(s) were performed by non-physician practitioner and as supervising physician I was immediately available for consultation/collaboration.   Deonne Rooks, MD 04/22/11 0150 

## 2011-04-24 NOTE — ED Provider Notes (Signed)
Addendum reviewed and agree  Dayton Bailiff, MD 04/24/11 [phone]

## 2011-04-26 ENCOUNTER — Other Ambulatory Visit (HOSPITAL_COMMUNITY): Payer: Self-pay | Admitting: Family Medicine

## 2011-04-26 DIAGNOSIS — Z139 Encounter for screening, unspecified: Secondary | ICD-10-CM

## 2011-05-01 ENCOUNTER — Ambulatory Visit (HOSPITAL_COMMUNITY)
Admission: RE | Admit: 2011-05-01 | Discharge: 2011-05-01 | Disposition: A | Discharge status: Home / Self Care | Payer: Medicaid Other | Source: Ambulatory Visit | Attending: Family Medicine | Admitting: Family Medicine

## 2011-05-01 DIAGNOSIS — Z139 Encounter for screening, unspecified: Secondary | ICD-10-CM

## 2011-05-01 DIAGNOSIS — Z1231 Encounter for screening mammogram for malignant neoplasm of breast: Secondary | ICD-10-CM | POA: Insufficient documentation

## 2011-05-04 ENCOUNTER — Other Ambulatory Visit (HOSPITAL_COMMUNITY): Payer: Self-pay | Admitting: Physician Assistant

## 2011-05-04 DIAGNOSIS — J984 Other disorders of lung: Secondary | ICD-10-CM

## 2011-05-08 ENCOUNTER — Ambulatory Visit (HOSPITAL_COMMUNITY): Payer: Medicaid Other

## 2011-05-10 ENCOUNTER — Ambulatory Visit (HOSPITAL_COMMUNITY): Payer: Medicaid Other

## 2011-05-28 ENCOUNTER — Encounter (HOSPITAL_COMMUNITY): Payer: Self-pay | Admitting: Emergency Medicine

## 2011-05-28 ENCOUNTER — Emergency Department (HOSPITAL_COMMUNITY): Payer: Medicaid Other

## 2011-05-28 ENCOUNTER — Emergency Department (HOSPITAL_COMMUNITY)
Admission: EM | Admit: 2011-05-28 | Discharge: 2011-05-28 | Disposition: A | Discharge status: Home / Self Care | Payer: Medicaid Other | Attending: Emergency Medicine | Admitting: Emergency Medicine

## 2011-05-28 DIAGNOSIS — R059 Cough, unspecified: Secondary | ICD-10-CM | POA: Insufficient documentation

## 2011-05-28 DIAGNOSIS — M25579 Pain in unspecified ankle and joints of unspecified foot: Secondary | ICD-10-CM | POA: Insufficient documentation

## 2011-05-28 DIAGNOSIS — F319 Bipolar disorder, unspecified: Secondary | ICD-10-CM | POA: Insufficient documentation

## 2011-05-28 DIAGNOSIS — Y92009 Unspecified place in unspecified non-institutional (private) residence as the place of occurrence of the external cause: Secondary | ICD-10-CM | POA: Insufficient documentation

## 2011-05-28 DIAGNOSIS — R05 Cough: Secondary | ICD-10-CM | POA: Insufficient documentation

## 2011-05-28 DIAGNOSIS — L0390 Cellulitis, unspecified: Secondary | ICD-10-CM

## 2011-05-28 DIAGNOSIS — R062 Wheezing: Secondary | ICD-10-CM | POA: Insufficient documentation

## 2011-05-28 DIAGNOSIS — R222 Localized swelling, mass and lump, trunk: Secondary | ICD-10-CM | POA: Insufficient documentation

## 2011-05-28 DIAGNOSIS — IMO0001 Reserved for inherently not codable concepts without codable children: Secondary | ICD-10-CM | POA: Insufficient documentation

## 2011-05-28 DIAGNOSIS — F4310 Post-traumatic stress disorder, unspecified: Secondary | ICD-10-CM | POA: Insufficient documentation

## 2011-05-28 DIAGNOSIS — F17200 Nicotine dependence, unspecified, uncomplicated: Secondary | ICD-10-CM | POA: Insufficient documentation

## 2011-05-28 DIAGNOSIS — R079 Chest pain, unspecified: Secondary | ICD-10-CM | POA: Insufficient documentation

## 2011-05-28 DIAGNOSIS — W19XXXA Unspecified fall, initial encounter: Secondary | ICD-10-CM | POA: Insufficient documentation

## 2011-05-28 DIAGNOSIS — L02419 Cutaneous abscess of limb, unspecified: Secondary | ICD-10-CM | POA: Insufficient documentation

## 2011-05-28 DIAGNOSIS — M129 Arthropathy, unspecified: Secondary | ICD-10-CM | POA: Insufficient documentation

## 2011-05-28 DIAGNOSIS — L03119 Cellulitis of unspecified part of limb: Secondary | ICD-10-CM | POA: Insufficient documentation

## 2011-05-28 LAB — BASIC METABOLIC PANEL
BUN: 8 mg/dL (ref 6–23)
Creatinine, Ser: 0.86 mg/dL (ref 0.50–1.10)
GFR calc Af Amer: >90 mL/min (ref >90)
GFR calc non Af Amer: 83 mL/min — ABNORMAL LOW (ref >90)
Glucose, Bld: 112 mg/dL — ABNORMAL HIGH (ref 70–99)

## 2011-05-28 LAB — DIFFERENTIAL
Basophils Relative: 0 % (ref 0–1)
Eosinophils Absolute: 0.4 10*3/uL (ref 0.0–0.7)
Lymphs Abs: 2 10*3/uL (ref 0.7–4.0)
Monocytes Absolute: 1.6 10*3/uL — ABNORMAL HIGH (ref 0.1–1.0)
Monocytes Relative: 14 % — ABNORMAL HIGH (ref 3–12)

## 2011-05-28 LAB — CBC
HCT: 35.5 % — ABNORMAL LOW (ref 36.0–46.0)
Hemoglobin: 11.7 g/dL — ABNORMAL LOW (ref 12.0–15.0)
MCH: 31.1 pg (ref 26.0–34.0)
MCHC: 33 g/dL (ref 30.0–36.0)
MCV: 94.4 fL (ref 78.0–100.0)

## 2011-05-28 MED ORDER — OXYCODONE-ACETAMINOPHEN 5-325 MG PO TABS
1.0000 | ORAL_TABLET | Freq: Once | ORAL | Status: AC
  Administered 2011-05-28: 1 via ORAL
  Filled 2011-05-28: qty 1

## 2011-05-28 MED ORDER — LIDOCAINE HCL (PF) 1 % IJ SOLN
INTRAMUSCULAR | Status: AC
  Administered 2011-05-28: 5 mL
  Filled 2011-05-28: qty 5

## 2011-05-28 MED ORDER — DOXYCYCLINE HYCLATE 100 MG PO CAPS: 100.0000 mg | ORAL_CAPSULE | Freq: Two times a day (BID) | ORAL | Status: AC

## 2011-05-28 MED ORDER — CEFTRIAXONE SODIUM 1 G IJ SOLR
INTRAMUSCULAR | Status: AC
  Administered 2011-05-28: 1000 mg
  Filled 2011-05-28: qty 10

## 2011-05-28 MED ORDER — HYDROMORPHONE HCL PF 1 MG/ML IJ SOLN
1.0000 mg | Freq: Once | INTRAMUSCULAR | Status: AC
  Administered 2011-05-28: 1 mg via INTRAMUSCULAR
  Filled 2011-05-28: qty 1

## 2011-05-28 MED ORDER — CEFTRIAXONE SODIUM 250 MG IJ SOLR: 1000.0000 mg | Freq: Once | INTRAMUSCULAR | Status: DC

## 2011-05-28 MED ORDER — OXYCODONE-ACETAMINOPHEN 5-325 MG PO TABS: 1.0000 | ORAL_TABLET | ORAL | Status: AC | PRN

## 2011-05-28 NOTE — ED Provider Notes (Signed)
Scribed for [name], MD, the patient was seen in room APA12/APA12 . This chart was scribed by Ellie Lunch.   CSN: [id number] Arrival date & time: 05/28/2011  7:03 AM   None     Chief Complaint  Patient presents with  . Ankle Pain    (Consider location/radiation/quality/duration/timing/severity/associated sxs/prior treatment) Patient is a 40 y.o. female presenting with ankle pain. The history is provided by the patient. No language interpreter was used.  Ankle Pain  The incident occurred yesterday. The incident occurred at home. The injury mechanism was a fall. The pain is present in the right ankle. The quality of the pain is described as aching. The pain is at a severity of 7/10. The pain is moderate. The pain has been constant since onset. The symptoms are aggravated by bearing weight and palpation. She has tried nothing for the symptoms.   Danielle Swanson is a 40 y.o. female who presents to the Emergency Department complaining of right ankle pain with associated swelling and redness to lateral aspect of rt. ankle . Pt reports cleaning the gutter yesterday am and jumped down hard off the patio causing the pain. Pt reports jumping ~6 feet. Pain has been constant since onset and is currently rated 7/10 in severity. Pt also reports he dog scratched the area and she is concerned it may also be infected. Additionally Pt says she was scheduled for chest xray today with her PCP Dr. Regino Schultze. Pt requests to do her xray here in the ED.   Past Medical History  Diagnosis Date  . Bipolar 1 disorder   . PTSD (post-traumatic stress disorder)   . Fibromyalgia   . Mass of left breast   . Arthritis     Past Surgical History  Procedure Date  . Abdominal hysterectomy     No family history on file.  History  Substance Use Topics  . Smoking status: Current Everyday Smoker -- 1.0 packs/day    Types: Cigarettes  . Smokeless tobacco: Not on file  . Alcohol Use: No    Review of Systems    Constitutional: Negative for fatigue.  HENT: Negative for congestion, sinus pressure and ear discharge.   Eyes: Negative for discharge.  Respiratory: Negative for cough.   Cardiovascular: Negative for chest pain.  Gastrointestinal: Negative for abdominal pain and diarrhea.  Genitourinary: Negative for frequency and hematuria.  Musculoskeletal: Negative for back pain.  Skin: Negative for rash.  Neurological: Negative for seizures and headaches.  Hematological: Negative.   Psychiatric/Behavioral: Negative for hallucinations.  All other systems reviewed and are negative.    Allergies  Aspirin; Haldol; Nsaids; Sulfa antibiotics; and Ultram  Home Medications   Current Outpatient Rx  Name Route Sig Dispense Refill  . ALPRAZOLAM 1 MG PO TABS Oral Take 1 mg by mouth 2 (two) times daily.      . ARIPIPRAZOLE 10 MG PO TABS Oral Take 10 mg by mouth 2 (two) times daily.      . CHLORPHEN-PE-ACETAMINOPHEN 2-5-325 MG PO TABS Oral Take 1 tablet by mouth once as needed. For cough     . ESTRADIOL 0.5 MG PO TABS Oral Take 0.5 mg by mouth daily.      Marland Kitchen GABAPENTIN 300 MG PO CAPS Oral Take 300 mg by mouth 3 (three) times daily.      Marland Kitchen GLUCOSAMINE PO Oral Take 1 tablet by mouth daily.      Marland Kitchen LISINOPRIL 5 MG PO TABS Oral Take 5 mg by mouth daily.      Marland Kitchen  MEPERIDINE HCL 50 MG PO TABS Oral Take 50 mg by mouth every 4 (four) hours as needed.      Carma Leaven M PLUS PO TABS Oral Take 1 tablet by mouth daily.      . OXYCODONE HCL 30 MG PO TABS Oral Take 30 mg by mouth 3 (three) times daily.      Marland Kitchen PROMETHAZINE HCL 25 MG PO TABS Oral Take 25 mg by mouth every 6 (six) hours as needed.      . TRAZODONE HCL 50 MG PO TABS Oral Take 50 mg by mouth at bedtime. For sleep       BP 104/52  Pulse 107  Temp(Src) 98 F (36.7 C) (Oral)  Resp 18  SpO2 100%  Physical Exam  Constitutional: She is oriented to person, place, and time. She appears well-developed.  HENT:  Head: Normocephalic.  Eyes: Conjunctivae are  normal.  Neck: No tracheal deviation present.  Cardiovascular: Intact distal pulses.   Pulmonary/Chest: Effort normal. She has wheezes (wheezes bilateral).  Musculoskeletal: She exhibits tenderness.       Tenderness around right ankle.  Good distal pulses Swelling entire right foot.  Erythema to distal half of foot and ankle.  Neurological: She is alert and oriented to person, place, and time.  Skin: Skin is warm.  Psychiatric: She has a normal mood and affect.    ED Course  Procedures (including critical care time) OTHER DATA REVIEWED: Nursing notes, vital signs, and past medical records reviewed.   DIAGNOSTIC STUDIES: Oxygen Saturation is 100% on room air, normal by my interpretation.    Results for orders placed during the hospital encounter of 05/28/11  CBC      Component Value Range   WBC 11.3 (*) 4.0 - 10.5 (K/uL)   RBC 3.76 (*) 3.87 - 5.11 (MIL/uL)   Hemoglobin 11.7 (*) 12.0 - 15.0 (g/dL)   HCT 32.4 (*) 40.1 - 46.0 (%)   MCV 94.4  78.0 - 100.0 (fL)   MCH 31.1  26.0 - 34.0 (pg)   MCHC 33.0  30.0 - 36.0 (g/dL)   RDW 02.7  25.3 - 66.4 (%)   Platelets 228  150 - 400 (K/uL)  DIFFERENTIAL      Component Value Range   Neutrophils Relative 64  43 - 77 (%)   Neutro Abs 7.2  1.7 - 7.7 (K/uL)   Lymphocytes Relative 18  12 - 46 (%)   Lymphs Abs 2.0  0.7 - 4.0 (K/uL)   Monocytes Relative 14 (*) 3 - 12 (%)   Monocytes Absolute 1.6 (*) 0.1 - 1.0 (K/uL)   Eosinophils Relative 4  0 - 5 (%)   Eosinophils Absolute 0.4  0.0 - 0.7 (K/uL)   Basophils Relative 0  0 - 1 (%)   Basophils Absolute 0.0  0.0 - 0.1 (K/uL)  BASIC METABOLIC PANEL      Component Value Range   Sodium 132 (*) 135 - 145 (mEq/L)   Potassium 3.2 (*) 3.5 - 5.1 (mEq/L)   Chloride 94 (*) 96 - 112 (mEq/L)   CO2 29  19 - 32 (mEq/L)   Glucose, Bld 112 (*) 70 - 99 (mg/dL)   BUN 8  6 - 23 (mg/dL)   Creatinine, Ser 4.03  0.50 - 1.10 (mg/dL)   Calcium 9.3  8.4 - 47.4 (mg/dL)   GFR calc non Af Amer 83 (*) >90 (mL/min)    GFR calc Af Amer >90  >90 (mL/min)   Dg Chest 2 View  05/28/2011  *RADIOLOGY REPORT*  Clinical Data: Chest pain and cough  CHEST - 2 VIEW  Comparison: 04/21/2011  Findings: That had no edema or focal airspace consolidation.  Small nodule in the lingula has been present since 08/03/2010 and was evaluated by CT on 10/19/2010.  Please see that report. The cardiopericardial silhouette is within normal limits for size. Abnormal opacity the AP window corresponds to a lymphadenopathy seen on the previous CT scan. Imaged bony structures of the thorax are intact.  IMPRESSION: Mediastinal lymphadenopathy with lingular pulmonary nodule.  These findings were better evaluated on the CT scan from 10/19/2010. Please see report.  I discussed the results of this study with Dr. Estell Harpin at [phone] hours on 05/28/2011.  Original Report Authenticated By: ERIC A. MANSELL, M.D.   Dg Ankle Complete Right  05/28/2011  *RADIOLOGY REPORT*  Clinical Data: Ankle injury with pain.  RIGHT ANKLE - COMPLETE 3+ VIEW  Comparison: None.  Findings: No evidence for an acute fracture.  No subluxation or dislocation.  No worrisome lytic or sclerotic osseous abnormality.  IMPRESSION: No acute bony findings.  Original Report Authenticated By: ERIC A. MANSELL, M.D.   Dg Foot Complete Right  05/28/2011  *RADIOLOGY REPORT*  Clinical Data: Injury with pain.  RIGHT FOOT COMPLETE - 3+ VIEW  Comparison: None.  Findings: There is no evidence for fracture, subluxation or dislocation.  No worrisome lytic or sclerotic osseous lesion.  IMPRESSION: No acute bony findings.  Original Report Authenticated By: ERIC A. MANSELL, M.D.   9:57 AM Pt recheck. Discussed foot x rays do not show any acute bony findings. Plan to discharge.   1. Cellulitis    ED MEDICATIONS  Medications  cefTRIAXone (ROCEPHIN) injection 1,000 mg   HYDROmorphone (DILAUDID) injection 1 mg   oxyCODONE-acetaminophen (PERCOCET) 5-325 MG per tablet 1 tablet (1 tablet Oral Given 05/28/11  0836)   ED DISCHARGE MEDICATIONS  New Prescriptions   DOXYCYCLINE (VIBRAMYCIN) 100 MG CAPSULE    Take 1 capsule (100 mg total) by mouth 2 (two) times daily.   OXYCODONE-ACETAMINOPHEN (PERCOCET) 5-325 MG PER TABLET    Take 1 tablet by mouth every 4 (four) hours as needed for pain.    MDM  Cellulitus,  Lung mass  The chart was scribed for me under my direct supervision.  I personally performed the history, physical, and medical decision making and all procedures in the evaluation of this patient..     Pt states she is to have a biopsy of the mass in her chest   [name], MD 05/28/11 1013

## 2011-05-28 NOTE — ED Notes (Signed)
Patient reports she was cleaning the gutters yesterday and stepped down hard on her ankle. C/o ankle pain. PMS intact. Redness noted to medial aspect of ankle.

## 2011-05-29 ENCOUNTER — Ambulatory Visit (HOSPITAL_COMMUNITY): Payer: Medicaid Other | Attending: Physician Assistant

## 2011-06-08 ENCOUNTER — Ambulatory Visit (HOSPITAL_COMMUNITY)
Admission: RE | Admit: 2011-06-08 | Discharge: 2011-06-08 | Disposition: A | Discharge status: Home / Self Care | Payer: Medicaid Other | Source: Ambulatory Visit | Attending: Physician Assistant | Admitting: Physician Assistant

## 2011-06-08 DIAGNOSIS — R599 Enlarged lymph nodes, unspecified: Secondary | ICD-10-CM | POA: Insufficient documentation

## 2011-06-08 DIAGNOSIS — J984 Other disorders of lung: Secondary | ICD-10-CM | POA: Insufficient documentation

## 2011-06-18 ENCOUNTER — Emergency Department (HOSPITAL_COMMUNITY)
Admission: EM | Admit: 2011-06-18 | Discharge: 2011-06-18 | Disposition: A | Discharge status: Home / Self Care | Payer: Medicaid Other | Attending: Emergency Medicine | Admitting: Emergency Medicine

## 2011-06-18 ENCOUNTER — Emergency Department (HOSPITAL_COMMUNITY): Payer: Medicaid Other

## 2011-06-18 ENCOUNTER — Other Ambulatory Visit: Payer: Self-pay

## 2011-06-18 ENCOUNTER — Encounter (HOSPITAL_COMMUNITY): Payer: Self-pay | Admitting: Emergency Medicine

## 2011-06-18 DIAGNOSIS — R222 Localized swelling, mass and lump, trunk: Secondary | ICD-10-CM | POA: Insufficient documentation

## 2011-06-18 DIAGNOSIS — R079 Chest pain, unspecified: Secondary | ICD-10-CM | POA: Insufficient documentation

## 2011-06-18 DIAGNOSIS — R0789 Other chest pain: Secondary | ICD-10-CM

## 2011-06-18 DIAGNOSIS — R918 Other nonspecific abnormal finding of lung field: Secondary | ICD-10-CM

## 2011-06-18 DIAGNOSIS — R110 Nausea: Secondary | ICD-10-CM | POA: Insufficient documentation

## 2011-06-18 DIAGNOSIS — G8929 Other chronic pain: Secondary | ICD-10-CM

## 2011-06-18 HISTORY — DX: Acute myocardial infarction, unspecified: I21.9

## 2011-06-18 HISTORY — DX: Atherosclerotic heart disease of native coronary artery without angina pectoris: I25.10

## 2011-06-18 LAB — COMPREHENSIVE METABOLIC PANEL WITH GFR
ALT: 9 U/L (ref 0–35)
AST: 14 U/L (ref 0–37)
CO2: 26 meq/L (ref 19–32)
Calcium: 9.9 mg/dL (ref 8.4–10.5)
GFR calc non Af Amer: >90 mL/min (ref >90)
Potassium: 4.2 meq/L (ref 3.5–5.1)
Sodium: 136 meq/L (ref 135–145)

## 2011-06-18 LAB — COMPREHENSIVE METABOLIC PANEL
Albumin: 3.9 g/dL (ref 3.5–5.2)
Alkaline Phosphatase: 71 U/L (ref 39–117)
BUN: 11 mg/dL (ref 6–23)
Chloride: 102 mEq/L (ref 96–112)
Creatinine, Ser: 0.7 mg/dL (ref 0.50–1.10)
GFR calc Af Amer: >90 mL/min (ref >90)
Glucose, Bld: 88 mg/dL (ref 70–99)
Total Bilirubin: 0.2 mg/dL — ABNORMAL LOW (ref 0.3–1.2)
Total Protein: 7.1 g/dL (ref 6.0–8.3)

## 2011-06-18 LAB — TROPONIN I: Troponin I: <0.3 ng/mL (ref <0.30)

## 2011-06-18 MED ORDER — HYDROMORPHONE HCL PF 2 MG/ML IJ SOLN
2.0000 mg | Freq: Once | INTRAMUSCULAR | Status: AC
  Administered 2011-06-18: 2 mg via INTRAVENOUS
  Filled 2011-06-18: qty 1

## 2011-06-18 MED ORDER — PROMETHAZINE HCL 25 MG PO TABS: 25.0000 mg | ORAL_TABLET | Freq: Four times a day (QID) | ORAL | Status: DC | PRN

## 2011-06-18 NOTE — ED Provider Notes (Signed)
History   This chart was scribed for Danielle Swanson. Oletta Lamas, MD by Clarita Crane. The patient was seen in room APA07/APA07 and the patient's care was started at 10:45AM.   CSN: [id number] Arrival date & time: 06/18/2011 10:07 AM   First MD Initiated Contact with Patient 06/18/11 1032      Chief Complaint  Patient presents with  . Chest Pain  . Generalized Body Aches     HPI KYMORAH KORF is a 40 y.o. female who presents to the Emergency Department complaining of chest pain which became worse last night.  She describes the pain as a squeezing that comes and goes.  Patient has chronic pain associated with reported tumors around lungs and heart.  Patient currently takes 30mg  of oxycodone at least 2 times per day, sometimes 3, with no relief of current pain.  She denies sore throat, diarrhea.  She is experiencing nausea and feels achy all over. Patients states she has a h/o of anxiety and depression. Also notes h/o MI 4 years ago and arthritis.   Past Medical History  Diagnosis Date  . Bipolar 1 disorder   . PTSD (post-traumatic stress disorder)   . Fibromyalgia   . Mass of left breast   . Arthritis   . Coronary artery disease   . Myocardial infarct     Past Surgical History  Procedure Date  . Abdominal hysterectomy     No family history on file.  History  Substance Use Topics  . Smoking status: Current Everyday Smoker -- 1.0 packs/day    Types: Cigarettes  . Smokeless tobacco: Not on file  . Alcohol Use: No     Review of Systems  Constitutional: Negative for fever.  HENT: Negative for congestion and rhinorrhea.   Respiratory: Negative for cough.   Cardiovascular: Positive for chest pain.  Gastrointestinal: Positive for nausea. Negative for vomiting, diarrhea and blood in stool.  Musculoskeletal: Positive for myalgias.  All other systems reviewed and are negative.   10 Systems reviewed and are negative for acute change except as noted in the HPI.  Allergies  Aspirin;  Haldol; Nsaids; Sulfa antibiotics; and Ultram  Home Medications   Current Outpatient Rx  Name Route Sig Dispense Refill  . ALPRAZOLAM 1 MG PO TABS Oral Take 1 mg by mouth 2 (two) times daily.     . AZITHROMYCIN 250 MG PO TABS Oral Take 250 mg by mouth daily. Last dose was "one day last week" per patient     . VITAMIN B 12 PO Oral Take 1 tablet by mouth daily. Unknown strength.     . ESTRADIOL 0.5 MG PO TABS Oral Take 0.5 mg by mouth daily.     Marland Kitchen GABAPENTIN 300 MG PO CAPS Oral Take 300 mg by mouth 3 (three) times daily.     Marland Kitchen GLUCOSAMINE PO Oral Take 1 tablet by mouth daily. Patient states strength is either 300mg  or 400mg  (not sure)    . LISINOPRIL 5 MG PO TABS Oral Take 5 mg by mouth daily.     Carma Leaven M PLUS PO TABS Oral Take 1 tablet by mouth daily.     Marland Kitchen PROMETHAZINE HCL 25 MG PO TABS Oral Take 1 tablet (25 mg total) by mouth every 6 (six) hours as needed for nausea. 20 tablet 0    BP 107/66  Pulse 100  Temp(Src) 99.8 F (37.7 C) (Oral)  Resp 21  Ht 5\' 2"  (1.575 m)  Wt 108 lb (48.988 kg)  BMI  19.75 kg/m2  SpO2 98%  Physical Exam  Nursing note and vitals reviewed. Constitutional: She is oriented to person, place, and time. She appears well-developed and well-nourished. No distress.  HENT:  Head: Normocephalic and atraumatic.  Eyes: EOM are normal. Pupils are equal, round, and reactive to light.  Neck: Neck supple. No tracheal deviation present.  Cardiovascular: Normal rate and regular rhythm.   No murmur heard. Pulmonary/Chest: Effort normal. No respiratory distress. She has no wheezes.  Abdominal: Soft. She exhibits no distension.  Musculoskeletal: Normal range of motion. She exhibits no edema.  Neurological: She is alert and oriented to person, place, and time. No sensory deficit.  Skin: Skin is warm and dry.  Psychiatric: She has a normal mood and affect. Her behavior is normal.    ED Course  Procedures (including critical care time)  DIAGNOSTIC STUDIES: Oxygen  Saturation is 98% on room air, normal by my interpretation.    COORDINATION OF CARE: 10:58 Ordered:  DG Chest 2 View, Comprehensive metabolic panel, HYDROmorphone (DILAUDID) injection 2 mg, Troponin I    Labs Reviewed  COMPREHENSIVE METABOLIC PANEL - Abnormal; Notable for the following:    Total Bilirubin 0.2 (*)    All other components within normal limits  TROPONIN I   Dg Chest 2 View  06/18/2011  *RADIOLOGY REPORT*  Clinical Data: 39 year old female with chest pain.  History of lung nodule.  CHEST - 2 VIEW  Comparison: 06/08/2011 chest CT and 05/28/2011 and prior chest radiographs  Findings: The cardiomediastinal silhouette is stable with prominence of the left mediastinum. A lingular nodule appears unchanged. There is no evidence of airspace disease, pleural effusion, or pneumothorax. No acute or suspicious bony abnormalities are identified.  IMPRESSION: Stable appearance of the chest - no evidence of acute cardiopulmonary disease.  Unchanged left mediastinal prominence and left lingular nodule. Recommend continued CT follow-up in 6 months or further evaluation with PET-CT.  Original Report Authenticated By: Rosendo Gros, M.D.     1. Lung mass   2. Chest pain, atypical   3. Chronic pain       MDM  ECG at 0957 shows NSR at rate 98, normal axis, diminished R in V3, new since prior ECG on 09/26/10.  This is non specific.  Troponin I is normal.  Pt' symtpoms are atypical and I think more related to anxiety and metastatic lung cancer, probably.  Her work up for the lung mass is in progress now, with appt with Dr. Edwyna Shell on 12/1.  Pt didn't take her usual pain meds this AM of which she takes 30 mg ocycodone 2-3 times per day.  I have encouraged her to follow up with Dr. Regino Schultze to consider increasing her pain management as I feel mets would account for her general malaise, increase in pain.  I doubt ACS, no PTX or new infiltrate on CXR, which I reivewd myself. O2 sats are  normal.        I personally performed the services described in this documentation, which was scribed in my presence. The recorded information has been reviewed and considered.  12:32 PM Pt feels improved, is reassured about blood tests and CXR.  Is improved enough to go home and continue current evaluations and treatments. Has appt with PCP on 12/1.    Danielle Swanson. Jonaven Hilgers, MD 06/18/11 1233

## 2011-06-18 NOTE — Discharge Instructions (Signed)
 Your EKG and blood tests show no heart damage.  Your chest x-ray shows no new changes, meaning no change in the lung nodules, no pneumonia or lung collapse.  Please follow up with Dr. Whitfield this week to discuss changes to your pain management as it seems things are steadily getting a little worse.

## 2011-06-18 NOTE — ED Notes (Signed)
Pt c/o squeezing pain in left chest since 0700 last night with weakness and body aches. Denies n/v/sob. Pt states she has "tumors around lungs and heart" and liver lesions. nad noted.

## 2011-06-21 ENCOUNTER — Other Ambulatory Visit: Payer: Self-pay | Admitting: Thoracic Surgery

## 2011-06-21 ENCOUNTER — Ambulatory Visit (INDEPENDENT_AMBULATORY_CARE_PROVIDER_SITE_OTHER): Payer: Medicaid Other | Admitting: Thoracic Surgery

## 2011-06-21 ENCOUNTER — Encounter: Payer: Self-pay | Admitting: Thoracic Surgery

## 2011-06-21 VITALS — BP 102/73 | HR 110 | Resp 20 | Ht 62.0 in | Wt 108.0 lb

## 2011-06-21 DIAGNOSIS — J984 Other disorders of lung: Secondary | ICD-10-CM

## 2011-06-21 DIAGNOSIS — D381 Neoplasm of uncertain behavior of trachea, bronchus and lung: Secondary | ICD-10-CM

## 2011-06-21 DIAGNOSIS — R911 Solitary pulmonary nodule: Secondary | ICD-10-CM

## 2011-06-21 NOTE — Progress Notes (Signed)
PCP is Kirk Ruths, MD Referring Provider is Colette Ribas, MD  Chief Complaint  Patient presents with  . Lung Lesion    Referral from Dr Phillips Odor for eval on Lung nodule, Chest CT 06/08/11    HPI: This 40 year old smoker has just been put on disability. She has a chronic pain syndrome she was found to have a left AP window node mass in March as well as a 10 mm left lingular nodule. She elected not to have anything done at that time. She had a repeat CT scan recently that showed that the AP window node as well as the nodule were essentially unchanged. She is referred here for evaluation. He had no marked this is fever chills or excessive sputum. I plan to get a PET scan on her. At the PET scan is positive then she'll need further evaluation. The PET scan is negative then hopefully following her will be all that is necessary.   Past Medical History  Diagnosis Date  . Bipolar 1 disorder   . PTSD (post-traumatic stress disorder)   . Fibromyalgia   . Mass of left breast   . Arthritis   . Coronary artery disease   . Myocardial infarct     Past Surgical History  Procedure Date  . Abdominal hysterectomy     No family history on file.  Social History History  Substance Use Topics  . Smoking status: Current Everyday Smoker -- 1.0 packs/day    Types: Cigarettes  . Smokeless tobacco: Not on file  . Alcohol Use: No    Current Outpatient Prescriptions  Medication Sig Dispense Refill  . ALPRAZolam (XANAX) 1 MG tablet Take 1 mg by mouth 2 (two) times daily.       . Cyanocobalamin (VITAMIN B 12 PO) Take 1 tablet by mouth daily. Unknown strength.       . estradiol (ESTRACE) 0.5 MG tablet Take 0.5 mg by mouth daily.       Marland Kitchen gabapentin (NEURONTIN) 300 MG capsule Take 300 mg by mouth 3 (three) times daily.       Marland Kitchen GLUCOSAMINE PO Take 1 tablet by mouth daily. Patient states strength is either 300mg  or 400mg  (not sure)      . lisinopril (PRINIVIL,ZESTRIL) 5 MG tablet Take 5 mg by  mouth daily.       . Multiple Vitamins-Minerals (MULTIVITAMINS THER. W/MINERALS) TABS Take 1 tablet by mouth daily.       Marland Kitchen oxycodone (ROXICODONE) 30 MG immediate release tablet Take 30 mg by mouth every 8 (eight) hours as needed.        . promethazine (PHENERGAN) 25 MG tablet Take 1 tablet (25 mg total) by mouth every 6 (six) hours as needed for nausea.  20 tablet  0    Allergies  Allergen Reactions  . Aspirin Other (See Comments)    Thins blood, swells throat  . Haldol (Haloperidol Decanoate) Other (See Comments)    Causes Stiffness and drawing up  . Nsaids Other (See Comments)    Ulcers prohibit patient from being able to take the following medication  . Sulfa Antibiotics Other (See Comments)    Blisters skin  . Ultram (Tramadol Hcl) Nausea Only    "biting" sensation within stomach    Review of Systems  Constitutional: Positive for appetite change and unexpected weight change.  Eyes: Negative.   Respiratory: Positive for shortness of breath.   Cardiovascular: Positive for palpitations.  Gastrointestinal: Positive for abdominal pain.  Musculoskeletal: Positive for back  pain and arthralgias.  Skin: Negative.   Neurological: Positive for headaches.  Hematological: Negative for adenopathy.  Psychiatric/Behavioral: Positive for agitation. The patient is nervous/anxious.     BP 102/73  Pulse 110  Resp 20  Ht 5\' 2"  (1.575 m)  Wt 108 lb (48.988 kg)  BMI 19.75 kg/m2  SpO2 100% Physical Exam  Constitutional: She is oriented to person, place, and time. She appears well-developed and well-nourished.  HENT:  Head: Normocephalic and atraumatic.  Right Ear: External ear normal.  Left Ear: External ear normal.  Eyes: Conjunctivae and EOM are normal. Pupils are equal, round, and reactive to light.  Neck: Normal range of motion. Neck supple. No thyromegaly present.  Cardiovascular: Normal rate, regular rhythm, normal heart sounds and intact distal pulses.   No murmur  heard. Pulmonary/Chest: Effort normal. She has wheezes. She exhibits tenderness.  Abdominal: Soft. Bowel sounds are normal. She exhibits no distension and no mass. There is no tenderness.  Musculoskeletal: Normal range of motion. She exhibits no edema and no tenderness.  Lymphadenopathy:    She has no cervical adenopathy.  Neurological: She is alert and oriented to person, place, and time. No cranial nerve deficit.  Skin: Skin is warm and dry.  Psychiatric: She has a normal mood and affect. Her behavior is normal. Judgment and thought content normal.     Diagnostic Tests: CT scan revealed adenopathy in the AP window and 9 mm left lingular nodule   Impression: AP window adenopathy left lingular nodule   Plan: PET scan

## 2011-06-26 ENCOUNTER — Ambulatory Visit (HOSPITAL_COMMUNITY)
Admission: RE | Admit: 2011-06-26 | Discharge: 2011-06-26 | Disposition: A | Discharge status: Home / Self Care | Payer: Medicaid Other | Source: Ambulatory Visit | Attending: Thoracic Surgery | Admitting: Thoracic Surgery

## 2011-06-26 ENCOUNTER — Encounter (HOSPITAL_COMMUNITY)
Admission: RE | Admit: 2011-06-26 | Discharge: 2011-06-26 | Disposition: A | Discharge status: Home / Self Care | Payer: Medicaid Other | Source: Ambulatory Visit | Attending: Thoracic Surgery | Admitting: Thoracic Surgery

## 2011-06-26 DIAGNOSIS — J988 Other specified respiratory disorders: Secondary | ICD-10-CM | POA: Insufficient documentation

## 2011-06-26 DIAGNOSIS — Z01811 Encounter for preprocedural respiratory examination: Secondary | ICD-10-CM | POA: Insufficient documentation

## 2011-06-26 DIAGNOSIS — R0609 Other forms of dyspnea: Secondary | ICD-10-CM | POA: Insufficient documentation

## 2011-06-26 DIAGNOSIS — F17200 Nicotine dependence, unspecified, uncomplicated: Secondary | ICD-10-CM | POA: Insufficient documentation

## 2011-06-26 DIAGNOSIS — R0989 Other specified symptoms and signs involving the circulatory and respiratory systems: Secondary | ICD-10-CM | POA: Insufficient documentation

## 2011-06-26 DIAGNOSIS — Z01812 Encounter for preprocedural laboratory examination: Secondary | ICD-10-CM | POA: Insufficient documentation

## 2011-06-26 DIAGNOSIS — D381 Neoplasm of uncertain behavior of trachea, bronchus and lung: Secondary | ICD-10-CM

## 2011-06-26 LAB — PULMONARY FUNCTION TEST

## 2011-06-26 MED ORDER — FLUDEOXYGLUCOSE F - 18 (FDG) INJECTION
19.6000 | Freq: Once | INTRAVENOUS | Status: AC | PRN
  Administered 2011-06-26: 19.6 via INTRAVENOUS

## 2011-06-27 ENCOUNTER — Encounter (HOSPITAL_COMMUNITY): Payer: Medicaid Other

## 2011-06-28 ENCOUNTER — Encounter: Payer: Self-pay | Admitting: Thoracic Surgery

## 2011-06-28 ENCOUNTER — Ambulatory Visit (INDEPENDENT_AMBULATORY_CARE_PROVIDER_SITE_OTHER): Payer: Medicaid Other | Admitting: Thoracic Surgery

## 2011-06-28 VITALS — BP 116/73 | HR 110 | Resp 20 | Ht 62.0 in | Wt 108.0 lb

## 2011-06-28 DIAGNOSIS — J984 Other disorders of lung: Secondary | ICD-10-CM

## 2011-06-28 NOTE — Progress Notes (Signed)
HPI patient returns for followup. She's had this on AP window node that is 1.8 x 2.8 for over 6 months. She also has a 9 mm left lingular nodule. PET scan was negative on both AP window node as well as the nodule. She still has chest pains and shortness of breath. Pulmonary function tests showed that her FVC was 83% with an FEV1 of 77% her diffusion capacity was 69% we gave her a referral for freeing nicotine patches so that she can stop smoking. We opened L. improve her dyspnea. I referred her back to her medical Dr. for chest pain. Also again in 6 months with another CT scan.   Current Outpatient Prescriptions  Medication Sig Dispense Refill  . ALPRAZolam (XANAX) 1 MG tablet Take 1 mg by mouth 2 (two) times daily.       Marland Kitchen amoxicillin (AMOXIL) 500 MG capsule Take 500 mg by mouth 3 (three) times daily.        . Cyanocobalamin (VITAMIN B 12 PO) Take 1 tablet by mouth daily. Unknown strength.       . estradiol (ESTRACE) 0.5 MG tablet Take 0.5 mg by mouth daily.       Marland Kitchen gabapentin (NEURONTIN) 300 MG capsule Take 300 mg by mouth 3 (three) times daily.       Marland Kitchen GLUCOSAMINE PO Take 1 tablet by mouth daily. Patient states strength is either 300mg  or 400mg  (not sure)      . lisinopril (PRINIVIL,ZESTRIL) 5 MG tablet Take 5 mg by mouth daily.       . Multiple Vitamins-Minerals (MULTIVITAMINS THER. W/MINERALS) TABS Take 1 tablet by mouth daily.       Marland Kitchen oxycodone (ROXICODONE) 30 MG immediate release tablet Take 30 mg by mouth every 8 (eight) hours as needed.           Review of Systems: Unchanged still has chest pain and dyspnea   Physical Exam lungs are clear to auscultation percussion   Diagnostic Tests: PET scan showed no uptake in the AP window node in the left lingular area   Impression: Left lingular nodule ongoing tobacco abuse AP window adenopathy   Plan: Return in 6 months with CT scan

## 2011-07-16 ENCOUNTER — Emergency Department (HOSPITAL_COMMUNITY)
Admission: EM | Admit: 2011-07-16 | Discharge: 2011-07-16 | Disposition: A | Discharge status: Home / Self Care | Payer: Medicaid Other

## 2011-07-16 NOTE — ED Notes (Signed)
Pt leaving without being triaged. Pt called for triage and states "Im going to another hospital". NAD at this time.

## 2011-09-26 ENCOUNTER — Ambulatory Visit (HOSPITAL_COMMUNITY): Payer: Medicaid Other | Admitting: Oncology

## 2011-10-06 ENCOUNTER — Encounter (HOSPITAL_COMMUNITY): Payer: Self-pay

## 2011-10-06 ENCOUNTER — Emergency Department (HOSPITAL_COMMUNITY)
Admission: EM | Admit: 2011-10-06 | Discharge: 2011-10-07 | Disposition: A | Discharge status: Home / Self Care | Payer: Medicaid Other | Attending: Emergency Medicine | Admitting: Emergency Medicine

## 2011-10-06 DIAGNOSIS — R109 Unspecified abdominal pain: Secondary | ICD-10-CM | POA: Insufficient documentation

## 2011-10-06 DIAGNOSIS — M129 Arthropathy, unspecified: Secondary | ICD-10-CM | POA: Insufficient documentation

## 2011-10-06 DIAGNOSIS — R110 Nausea: Secondary | ICD-10-CM | POA: Insufficient documentation

## 2011-10-06 DIAGNOSIS — F319 Bipolar disorder, unspecified: Secondary | ICD-10-CM | POA: Insufficient documentation

## 2011-10-06 DIAGNOSIS — R319 Hematuria, unspecified: Secondary | ICD-10-CM | POA: Insufficient documentation

## 2011-10-06 DIAGNOSIS — R51 Headache: Secondary | ICD-10-CM | POA: Insufficient documentation

## 2011-10-06 DIAGNOSIS — I2510 Atherosclerotic heart disease of native coronary artery without angina pectoris: Secondary | ICD-10-CM | POA: Insufficient documentation

## 2011-10-06 DIAGNOSIS — I252 Old myocardial infarction: Secondary | ICD-10-CM | POA: Insufficient documentation

## 2011-10-06 DIAGNOSIS — Z79899 Other long term (current) drug therapy: Secondary | ICD-10-CM | POA: Insufficient documentation

## 2011-10-06 HISTORY — DX: Neoplasm of unspecified behavior of unspecified site: D49.9

## 2011-10-06 LAB — CBC
MCV: 90.6 fL (ref 78.0–100.0)
Platelets: 275 10*3/uL (ref 150–400)
RBC: 4.35 MIL/uL (ref 3.87–5.11)
RDW: 14.8 % (ref 11.5–15.5)
WBC: 8.3 10*3/uL (ref 4.0–10.5)

## 2011-10-06 LAB — URINALYSIS, ROUTINE W REFLEX MICROSCOPIC
Bilirubin Urine: NEGATIVE
Glucose, UA: NEGATIVE mg/dL
Specific Gravity, Urine: >1.03 — ABNORMAL HIGH (ref 1.005–1.030)
Urobilinogen, UA: 0.2 mg/dL (ref 0.0–1.0)
pH: 5.5 (ref 5.0–8.0)

## 2011-10-06 LAB — URINE MICROSCOPIC-ADD ON

## 2011-10-06 MED ORDER — SODIUM CHLORIDE 0.9 % IV BOLUS (SEPSIS)
1000.0000 mL | Freq: Once | INTRAVENOUS | Status: AC
  Administered 2011-10-07: 1000 mL via INTRAVENOUS

## 2011-10-06 MED ORDER — ONDANSETRON HCL 4 MG/2ML IJ SOLN
4.0000 mg | Freq: Once | INTRAMUSCULAR | Status: AC
  Administered 2011-10-07: 4 mg via INTRAVENOUS
  Filled 2011-10-06: qty 2

## 2011-10-06 MED ORDER — HYDROMORPHONE HCL PF 1 MG/ML IJ SOLN
1.0000 mg | Freq: Once | INTRAMUSCULAR | Status: AC
  Administered 2011-10-07: 1 mg via INTRAVENOUS
  Filled 2011-10-06: qty 1

## 2011-10-06 NOTE — ED Notes (Signed)
Pt presents with migraine, left sided flank pain, and hematuria x 3 days.

## 2011-10-07 LAB — BASIC METABOLIC PANEL
CO2: 24 mEq/L (ref 19–32)
Chloride: 99 mEq/L (ref 96–112)
Creatinine, Ser: 0.58 mg/dL (ref 0.50–1.10)
GFR calc Af Amer: >90 mL/min (ref >90)
Sodium: 133 mEq/L — ABNORMAL LOW (ref 135–145)

## 2011-10-07 MED ORDER — HYDROCODONE-ACETAMINOPHEN 5-325 MG PO TABS: 1.0000 | ORAL_TABLET | ORAL | Status: AC | PRN

## 2011-10-07 MED ORDER — PROMETHAZINE HCL 25 MG PO TABS: 12.5000 mg | ORAL_TABLET | Freq: Four times a day (QID) | ORAL | Status: DC | PRN

## 2011-10-07 NOTE — ED Provider Notes (Signed)
History     CSN: [id number]  Arrival date & time 10/06/11  2113   First MD Initiated Contact with Patient 10/06/11 2308      Chief Complaint  Patient presents with  . Migraine  . Flank Pain  . Hematuria    (Consider location/radiation/quality/duration/timing/severity/associated sxs/prior treatment) HPI Danielle Swanson is a 41 y.o. female who presents to the Emergency Department complaining of headache, left-sided flank pain, and hematuria for 3 days. Patient has a history of kidney stones and she feels the pain is similar to previous times. Last done was approximately a year ago. She has had mild nausea associated with the flank pain but no vomiting. She has taken no medicines.  PCP Dr. Regino Schultze Past Medical History  Diagnosis Date  . Bipolar 1 disorder   . PTSD (post-traumatic stress disorder)   . Fibromyalgia   . Mass of left breast   . Arthritis   . Coronary artery disease   . Myocardial infarct   . Tumors     Past Surgical History  Procedure Date  . Abdominal hysterectomy     No family history on file.  History  Substance Use Topics  . Smoking status: Current Everyday Smoker -- 1.0 packs/day    Types: Cigarettes  . Smokeless tobacco: Not on file  . Alcohol Use: No    OB History    Grav Para Term Preterm Abortions TAB SAB Ect Mult Living                  Review of Systems ROS: Statement: All systems negative except as marked or noted in the HPI; Constitutional: Negative for fever and chills. ; ; Eyes: Negative for eye pain, redness and discharge. ; ; ENMT: Negative for ear pain, hoarseness, nasal congestion, sinus pressure and sore throat. ; ; Cardiovascular: Negative for chest pain, palpitations, diaphoresis, dyspnea and peripheral edema. ; ; Respiratory: Negative for cough, wheezing and stridor. ; ; Gastrointestinal: Negative for nausea, vomiting, diarrhea, abdominal pain, blood in stool, hematemesis, jaundice and rectal bleeding. . ; ; Genitourinary:  Negative for dysuria,. ; ; Musculoskeletal: Negative for back pain and neck pain. Negative for swelling and trauma.; ; Skin: Negative for pruritus, rash, abrasions, blisters, bruising and skin lesion.; ; Neuro: Negative for  lightheadedness and neck stiffness. Negative for weakness, altered level of consciousness , altered mental status, extremity weakness, paresthesias, involuntary movement, seizure and syncope.     Allergies  Aspirin; Haldol; Nsaids; Sulfa antibiotics; and Ultram  Home Medications   Current Outpatient Rx  Name Route Sig Dispense Refill  . ALPRAZOLAM 1 MG PO TABS Oral Take 1 mg by mouth 2 (two) times daily.     . AMOXICILLIN 500 MG PO CAPS Oral Take 500 mg by mouth 3 (three) times daily.      Marland Kitchen VITAMIN B 12 PO Oral Take 1 tablet by mouth daily. Unknown strength.     . ESTRADIOL 0.5 MG PO TABS Oral Take 0.5 mg by mouth daily.     Marland Kitchen GABAPENTIN 300 MG PO CAPS Oral Take 300 mg by mouth 3 (three) times daily.     Marland Kitchen GLUCOSAMINE PO Oral Take 1 tablet by mouth daily. Patient states strength is either 300mg  or 400mg  (not sure)    . LISINOPRIL 5 MG PO TABS Oral Take 5 mg by mouth daily.     Carma Leaven M PLUS PO TABS Oral Take 1 tablet by mouth daily.     . OXYCODONE HCL 30 MG  PO TABS Oral Take 30 mg by mouth every 8 (eight) hours as needed.        BP 104/76  Pulse 117  Temp(Src) 98.3 F (36.8 C) (Oral)  Resp 16  Ht 5' 3.5" (1.613 m)  Wt 118 lb (53.524 kg)  BMI 20.57 kg/m2  SpO2 100%  Physical Exam Physical examination:  Nursing notes reviewed; Vital signs and O2 SAT reviewed;  Constitutional: Well developed, Well nourished, Well hydrated, In no acute distress; Head:  Normocephalic, atraumatic; Eyes: EOMI, PERRL, No scleral icterus; ENMT: Mouth and pharynx normal, Mucous membranes moist; Neck: Supple, Full range of motion, No lymphadenopathy; Cardiovascular: Regular rate and rhythm, No murmur, rub, or gallop; Respiratory: Breath sounds clear & equal bilaterally, No rales, rhonchi,  wheezes, or rub, Normal respiratory effort/excursion; Chest: Nontender, Movement normal; Abdomen: Soft, Nontender, Nondistended, Normal bowel sounds; Genitourinary: No CVA tenderness; Extremities: Pulses normal, No tenderness, No edema, No calf edema or asymmetry.; Neuro: AA&Ox3, Major CN grossly intact.  No gross focal motor or sensory deficits in extremities.; Skin: Color normal, Warm, Dry  ED Course  Procedures (including critical care time) Results for orders placed during the hospital encounter of 10/06/11  URINALYSIS, ROUTINE W REFLEX MICROSCOPIC      Component Value Range   Color, Urine AMBER (*) YELLOW    APPearance CLEAR  CLEAR    Specific Gravity, Urine >1.030 (*) 1.005 - 1.030    pH 5.5  5.0 - 8.0    Glucose, UA NEGATIVE  NEGATIVE (mg/dL)   Hgb urine dipstick MODERATE (*) NEGATIVE    Bilirubin Urine NEGATIVE  NEGATIVE    Ketones, ur NEGATIVE  NEGATIVE (mg/dL)   Protein, ur 30 (*) NEGATIVE (mg/dL)   Urobilinogen, UA 0.2  0.0 - 1.0 (mg/dL)   Nitrite NEGATIVE  NEGATIVE    Leukocytes, UA NEGATIVE  NEGATIVE   URINE MICROSCOPIC-ADD ON      Component Value Range   Squamous Epithelial / LPF FEW (*) RARE    WBC, UA 3-6  <3 (WBC/hpf)   RBC / HPF 3-6  <3 (RBC/hpf)   Bacteria, UA FEW (*) RARE   CBC      Component Value Range   WBC 8.3  4.0 - 10.5 (K/uL)   RBC 4.35  3.87 - 5.11 (MIL/uL)   Hemoglobin 13.6  12.0 - 15.0 (g/dL)   HCT 09.6  04.5 - 40.9 (%)   MCV 90.6  78.0 - 100.0 (fL)   MCH 31.3  26.0 - 34.0 (pg)   MCHC 34.5  30.0 - 36.0 (g/dL)   RDW 81.1  91.4 - 78.2 (%)   Platelets 275  150 - 400 (K/uL)       MDM  Patient with left-sided flank pain, hematuria, and headache for 3 days. Given IV fluids, analgesics, and antiemetic  with improvement. Pt feels improved after observation and/or treatment in ED.Pt stable in ED with no significant deterioration in condition.The patient appears reasonably screened and/or stabilized for discharge and I doubt any other medical condition or  other [facility] requiring further screening, evaluation, or treatment in the ED at this time prior to discharge.  MDM Reviewed: nursing note, vitals and previous chart Interpretation: labs and CT scan           Nicoletta Dress. Colon Branch, MD 10/07/11 [phone]

## 2011-10-07 NOTE — Discharge Instructions (Signed)
Drink lots of fluids. Use the pain and nausea medicine as needed.Follow up with your doctor.

## 2011-10-19 ENCOUNTER — Emergency Department (HOSPITAL_COMMUNITY)
Admission: EM | Admit: 2011-10-19 | Discharge: 2011-10-19 | Disposition: A | Discharge status: Home / Self Care | Payer: Medicaid Other | Attending: Emergency Medicine | Admitting: Emergency Medicine

## 2011-10-19 ENCOUNTER — Encounter (HOSPITAL_COMMUNITY): Payer: Self-pay

## 2011-10-19 ENCOUNTER — Emergency Department (HOSPITAL_COMMUNITY): Payer: Medicaid Other

## 2011-10-19 DIAGNOSIS — I252 Old myocardial infarction: Secondary | ICD-10-CM | POA: Insufficient documentation

## 2011-10-19 DIAGNOSIS — H53149 Visual discomfort, unspecified: Secondary | ICD-10-CM | POA: Insufficient documentation

## 2011-10-19 DIAGNOSIS — R110 Nausea: Secondary | ICD-10-CM | POA: Insufficient documentation

## 2011-10-19 DIAGNOSIS — F319 Bipolar disorder, unspecified: Secondary | ICD-10-CM | POA: Insufficient documentation

## 2011-10-19 DIAGNOSIS — Z79899 Other long term (current) drug therapy: Secondary | ICD-10-CM | POA: Insufficient documentation

## 2011-10-19 DIAGNOSIS — R51 Headache: Secondary | ICD-10-CM

## 2011-10-19 DIAGNOSIS — M129 Arthropathy, unspecified: Secondary | ICD-10-CM | POA: Insufficient documentation

## 2011-10-19 DIAGNOSIS — I2510 Atherosclerotic heart disease of native coronary artery without angina pectoris: Secondary | ICD-10-CM | POA: Insufficient documentation

## 2011-10-19 MED ORDER — DIPHENHYDRAMINE HCL 50 MG/ML IJ SOLN
50.0000 mg | Freq: Once | INTRAMUSCULAR | Status: AC
  Administered 2011-10-19: 50 mg via INTRAVENOUS
  Filled 2011-10-19: qty 1

## 2011-10-19 MED ORDER — METOCLOPRAMIDE HCL 5 MG/ML IJ SOLN
10.0000 mg | Freq: Once | INTRAMUSCULAR | Status: AC
  Administered 2011-10-19: 10 mg via INTRAVENOUS
  Filled 2011-10-19: qty 2

## 2011-10-19 MED ORDER — DEXAMETHASONE SODIUM PHOSPHATE 4 MG/ML IJ SOLN
10.0000 mg | Freq: Once | INTRAMUSCULAR | Status: AC
  Administered 2011-10-19: 10 mg via INTRAVENOUS
  Filled 2011-10-19: qty 3

## 2011-10-19 MED ORDER — SODIUM CHLORIDE 0.9 % IV SOLN
INTRAVENOUS | Status: DC
  Administered 2011-10-19: 11:00:00 via INTRAVENOUS

## 2011-10-19 MED ORDER — OXYCODONE-ACETAMINOPHEN 5-325 MG PO TABS
2.0000 | ORAL_TABLET | Freq: Once | ORAL | Status: AC
  Administered 2011-10-19: 2 via ORAL
  Filled 2011-10-19: qty 2

## 2011-10-19 NOTE — ED Provider Notes (Signed)
History   This chart was scribed for Joya Gaskins, MD by Brooks Sailors. The patient was seen in room APA19/APA19. Patient's care was started at 0921.   CSN: [id number]  Arrival date & time 10/19/11  0921   First MD Initiated Contact with Patient 10/19/11 [phone]      Chief complaint - headache   HPI  DENISSA COZART is a 41 y.o. female who presents to the Emergency Department complaining of constant severe pulsating headache onset a few days ago and persistent since with associated photophobia and nausea.  Patient localized pain to the posterior head. Denies fever, vision changes, syncope, and numbness, tingling, and pain in extremities.  Patients mother and father both have h/o migraines, recent contact with father who currently has no migraine symptoms. Patient with h/o migraines, PTSD, and bipolar disorder.  Also h/o "tumor" but is not on active chemo at this time No recent trauma She reports her course is worsening  Past Medical History  Diagnosis Date  . Bipolar 1 disorder   . PTSD (post-traumatic stress disorder)   . Fibromyalgia   . Mass of left breast   . Arthritis   . Coronary artery disease   . Myocardial infarct   . Tumors     Past Surgical History  Procedure Date  . Abdominal hysterectomy     No family history on file.  History  Substance Use Topics  . Smoking status: Current Everyday Smoker -- 1.0 packs/day    Types: Cigarettes  . Smokeless tobacco: Not on file  . Alcohol Use: No    OB History    Grav Para Term Preterm Abortions TAB SAB Ect Mult Living                  Review of Systems A complete 10 system review of systems was obtained and all systems are negative except as noted in the HPI and PMH.   Allergies  Aspirin; Haldol; Nsaids; Sulfa antibiotics; and Ultram  Home Medications   Current Outpatient Rx  Name Route Sig Dispense Refill  . ALPRAZOLAM 1 MG PO TABS Oral Take 1 mg by mouth 2 (two) times daily.     . AMOXICILLIN 500 MG PO  CAPS Oral Take 500 mg by mouth 3 (three) times daily.      Marland Kitchen VITAMIN B 12 PO Oral Take 1 tablet by mouth daily. Unknown strength.     . ESTRADIOL 0.5 MG PO TABS Oral Take 0.5 mg by mouth daily.     Marland Kitchen GABAPENTIN 300 MG PO CAPS Oral Take 300 mg by mouth 3 (three) times daily.     Marland Kitchen GLUCOSAMINE PO Oral Take 1 tablet by mouth daily. Patient states strength is either 300mg  or 400mg  (not sure)    . LISINOPRIL 5 MG PO TABS Oral Take 5 mg by mouth daily.     Carma Leaven M PLUS PO TABS Oral Take 1 tablet by mouth daily.     . OXYCODONE HCL 30 MG PO TABS Oral Take 30 mg by mouth every 8 (eight) hours as needed.        BP 119/79  Pulse 81  Temp(Src) 98.2 F (36.8 C) (Oral)  Resp 18  Ht 5' 3.5" (1.613 m)  Wt 108 lb (48.988 kg)  BMI 18.83 kg/m2  SpO2 100%   Physical Exam CONSTITUTIONAL: Well developed/well nourished HEAD AND FACE: Normocephalic/atraumatic EYES: EOMI/PERRL ENMT: Mucous membranes moist, .  NECK: supple no meningeal signs No bruits SPINE:entire spine  nontender CV: S1/S2 noted, no murmurs/rubs/gallops noted LUNGS: Lungs are clear to auscultation bilaterally, no apparent distress ABDOMEN: soft, nontender, no rebound or guarding NEURO: Pt is awake/alert, moves all extremitiesx4  Awake/alert, facies symmetric, no arm or leg drift is noted Cranial nerves 3/4/5/6/01/29/09/11/12 tested and intact Gait normal No past pointing.  EXTREMITIES: pulses normal, full ROM SKIN: warm, color normal PSYCH: no abnormalities of mood noted  ED Course  Procedures  DIAGNOSTIC STUDIES: Oxygen Saturation is 100% on room air, normal by my interpretation.    COORDINATION OF CARE: 9:35 AM Patient informed of current plan for treatment and evaluation and agrees with plan at this time, ordering CT of the head as patient has h/o "tumors" 11:13 AM Ct head negative Pt reports HA similar to prior Stable for d/c BP 119/79  Pulse 81  Temp(Src) 98.2 F (36.8 C) (Oral)  Resp 18  Ht 5' 3.5" (1.613 m)  Wt  108 lb (48.988 kg)  BMI 18.83 kg/m2  SpO2 100% The patient appears reasonably screened and/or stabilized for discharge and I doubt any other medical condition or other [facility] requiring further screening, evaluation, or treatment in the ED at this time prior to discharge.      MDM  Nursing notes reviewed and considered in documentation       I personally performed the services described in this documentation, which was scribed in my presence. The recorded information has been reviewed and considered.      Joya Gaskins, MD 10/19/11 1114

## 2011-10-19 NOTE — ED Notes (Signed)
Pt return from ct

## 2011-10-19 NOTE — ED Notes (Signed)
Complain of migraine. States she was here a week ago for same.

## 2011-10-19 NOTE — Discharge Instructions (Signed)

## 2011-10-20 ENCOUNTER — Inpatient Hospital Stay: Payer: Self-pay | Admitting: Psychiatry

## 2011-10-20 LAB — COMPREHENSIVE METABOLIC PANEL
Albumin: 3.2 g/dL — ABNORMAL LOW (ref 3.4–5.0)
Alkaline Phosphatase: 91 U/L (ref 50–136)
BUN: 15 mg/dL (ref 7–18)
Bilirubin,Total: 0.1 mg/dL — ABNORMAL LOW (ref 0.2–1.0)
Calcium, Total: 8.9 mg/dL (ref 8.5–10.1)
Glucose: 123 mg/dL — ABNORMAL HIGH (ref 65–99)
Osmolality: 287 (ref 275–301)
SGOT(AST): 14 U/L — ABNORMAL LOW (ref 15–37)
Total Protein: 6.5 g/dL (ref 6.4–8.2)

## 2011-10-20 LAB — SALICYLATE LEVEL: Salicylates, Serum: 4.1 mg/dL — ABNORMAL HIGH

## 2011-10-20 LAB — DRUG SCREEN, URINE
Amphetamines, Ur Screen: NEGATIVE (ref ?–1000)
Barbiturates, Ur Screen: NEGATIVE (ref ?–200)
Cocaine Metabolite,Ur ~~LOC~~: POSITIVE (ref ?–300)
MDMA (Ecstasy)Ur Screen: NEGATIVE (ref ?–500)
Methadone, Ur Screen: NEGATIVE (ref ?–300)
Phencyclidine (PCP) Ur S: NEGATIVE (ref ?–25)

## 2011-10-20 LAB — CBC
HGB: 11.1 g/dL — ABNORMAL LOW (ref 12.0–16.0)
MCH: 31.1 pg (ref 26.0–34.0)
MCHC: 33.8 g/dL (ref 32.0–36.0)
MCV: 92 fL (ref 80–100)
Platelet: 279 10*3/uL (ref 150–440)
RBC: 3.58 10*6/uL — ABNORMAL LOW (ref 3.80–5.20)
RDW: 15.4 % — ABNORMAL HIGH (ref 11.5–14.5)
WBC: 13.9 10*3/uL — ABNORMAL HIGH (ref 3.6–11.0)

## 2011-10-20 LAB — URINALYSIS, COMPLETE
Bilirubin,UR: NEGATIVE
Glucose,UR: NEGATIVE mg/dL (ref 0–75)
Ketone: NEGATIVE
Leukocyte Esterase: NEGATIVE
Nitrite: NEGATIVE
Protein: NEGATIVE
RBC,UR: 3 /HPF (ref 0–5)
WBC UR: 5 /HPF (ref 0–5)

## 2011-10-20 LAB — ETHANOL
Ethanol %: <0.003 % (ref 0.000–0.080)
Ethanol: <3 mg/dL

## 2011-10-20 LAB — ACETAMINOPHEN LEVEL: Acetaminophen: 10 ug/mL

## 2011-10-23 LAB — CBC WITH DIFFERENTIAL/PLATELET
Basophil #: 0.1 10*3/uL (ref 0.0–0.1)
Eosinophil #: 0.4 10*3/uL (ref 0.0–0.7)
HCT: 43.1 % (ref 35.0–47.0)
HGB: 14.2 g/dL (ref 12.0–16.0)
Lymphocyte #: 4.5 10*3/uL — ABNORMAL HIGH (ref 1.0–3.6)
Lymphocyte %: 41.4 %
MCH: 30.7 pg (ref 26.0–34.0)
MCHC: 33 g/dL (ref 32.0–36.0)
MCV: 93 fL (ref 80–100)
Monocyte #: 1.2 10*3/uL — ABNORMAL HIGH (ref 0.0–0.7)
Neutrophil #: 4.7 10*3/uL (ref 1.4–6.5)
Platelet: 479 10*3/uL — ABNORMAL HIGH (ref 150–440)
RBC: 4.64 10*6/uL (ref 3.80–5.20)
WBC: 10.9 10*3/uL (ref 3.6–11.0)

## 2011-10-24 LAB — LITHIUM LEVEL: Lithium: 0.85 mmol/L

## 2011-10-31 LAB — COMPREHENSIVE METABOLIC PANEL
Albumin: 3.2 g/dL — ABNORMAL LOW (ref 3.4–5.0)
Alkaline Phosphatase: 78 U/L (ref 50–136)
Bilirubin,Total: 0.3 mg/dL (ref 0.2–1.0)
Chloride: 107 mmol/L (ref 98–107)
Co2: 28 mmol/L (ref 21–32)
Creatinine: 0.83 mg/dL (ref 0.60–1.30)
EGFR (African American): >60
Glucose: 96 mg/dL (ref 65–99)
Osmolality: 284 (ref 275–301)
Potassium: 3.5 mmol/L (ref 3.5–5.1)
SGOT(AST): 25 U/L (ref 15–37)
Sodium: 143 mmol/L (ref 136–145)

## 2011-10-31 LAB — URINALYSIS, COMPLETE
Glucose,UR: NEGATIVE mg/dL (ref 0–75)
Nitrite: NEGATIVE
Ph: 6 (ref 4.5–8.0)
RBC,UR: 2 /HPF (ref 0–5)
Specific Gravity: 1.017 (ref 1.003–1.030)
WBC UR: 3 /HPF (ref 0–5)

## 2011-10-31 LAB — CBC
MCH: 30.6 pg (ref 26.0–34.0)
MCHC: 33.5 g/dL (ref 32.0–36.0)
MCV: 91 fL (ref 80–100)
RBC: 3.72 10*6/uL — ABNORMAL LOW (ref 3.80–5.20)
WBC: 8.7 10*3/uL (ref 3.6–11.0)

## 2011-10-31 LAB — DRUG SCREEN, URINE
Amphetamines, Ur Screen: NEGATIVE (ref ?–1000)
Benzodiazepine, Ur Scrn: POSITIVE (ref ?–200)
MDMA (Ecstasy)Ur Screen: NEGATIVE (ref ?–500)
Methadone, Ur Screen: NEGATIVE (ref ?–300)
Opiate, Ur Screen: POSITIVE (ref ?–300)
Phencyclidine (PCP) Ur S: NEGATIVE (ref ?–25)
Tricyclic, Ur Screen: NEGATIVE (ref ?–1000)

## 2011-10-31 LAB — ETHANOL
Ethanol %: <0.003 % (ref 0.000–0.080)
Ethanol: <3 mg/dL

## 2011-11-01 ENCOUNTER — Inpatient Hospital Stay: Payer: Self-pay | Admitting: Psychiatry

## 2011-11-01 LAB — LITHIUM LEVEL: Lithium: <0.2 mmol/L — ABNORMAL LOW

## 2011-11-02 LAB — LIPID PANEL
Cholesterol: 130 mg/dL (ref 0–200)
Ldl Cholesterol, Calc: 75 mg/dL (ref 0–100)
Triglycerides: 165 mg/dL (ref 0–200)
VLDL Cholesterol, Calc: 33 mg/dL (ref 5–40)

## 2011-11-02 LAB — FOLATE: Folic Acid: 9.9 ng/mL (ref 3.1–100.0)

## 2011-11-06 LAB — COMPREHENSIVE METABOLIC PANEL
Albumin: 3.5 g/dL (ref 3.4–5.0)
Alkaline Phosphatase: 72 U/L (ref 50–136)
Bilirubin,Total: 0.3 mg/dL (ref 0.2–1.0)
Calcium, Total: 8.8 mg/dL (ref 8.5–10.1)
Chloride: 101 mmol/L (ref 98–107)
Co2: 32 mmol/L (ref 21–32)
EGFR (African American): >60
EGFR (Non-African Amer.): >60
Glucose: 108 mg/dL — ABNORMAL HIGH (ref 65–99)
Osmolality: 281 (ref 275–301)
SGOT(AST): 17 U/L (ref 15–37)
SGPT (ALT): 16 U/L

## 2011-11-06 LAB — VALPROIC ACID LEVEL: Valproic Acid: 79 ug/mL

## 2011-11-11 ENCOUNTER — Encounter (HOSPITAL_COMMUNITY): Payer: Self-pay | Admitting: *Deleted

## 2011-11-11 ENCOUNTER — Emergency Department (HOSPITAL_COMMUNITY)
Admission: EM | Admit: 2011-11-11 | Discharge: 2011-11-11 | Disposition: A | Discharge status: Home / Self Care | Payer: Medicaid Other | Attending: Emergency Medicine | Admitting: Emergency Medicine

## 2011-11-11 DIAGNOSIS — F319 Bipolar disorder, unspecified: Secondary | ICD-10-CM | POA: Insufficient documentation

## 2011-11-11 DIAGNOSIS — I252 Old myocardial infarction: Secondary | ICD-10-CM | POA: Insufficient documentation

## 2011-11-11 DIAGNOSIS — R61 Generalized hyperhidrosis: Secondary | ICD-10-CM | POA: Insufficient documentation

## 2011-11-11 DIAGNOSIS — R6883 Chills (without fever): Secondary | ICD-10-CM | POA: Insufficient documentation

## 2011-11-11 DIAGNOSIS — F19939 Other psychoactive substance use, unspecified with withdrawal, unspecified: Secondary | ICD-10-CM | POA: Insufficient documentation

## 2011-11-11 DIAGNOSIS — Z79899 Other long term (current) drug therapy: Secondary | ICD-10-CM | POA: Insufficient documentation

## 2011-11-11 DIAGNOSIS — M129 Arthropathy, unspecified: Secondary | ICD-10-CM | POA: Insufficient documentation

## 2011-11-11 DIAGNOSIS — R197 Diarrhea, unspecified: Secondary | ICD-10-CM | POA: Insufficient documentation

## 2011-11-11 DIAGNOSIS — R109 Unspecified abdominal pain: Secondary | ICD-10-CM | POA: Insufficient documentation

## 2011-11-11 DIAGNOSIS — F1123 Opioid dependence with withdrawal: Secondary | ICD-10-CM

## 2011-11-11 DIAGNOSIS — IMO0001 Reserved for inherently not codable concepts without codable children: Secondary | ICD-10-CM | POA: Insufficient documentation

## 2011-11-11 DIAGNOSIS — F17200 Nicotine dependence, unspecified, uncomplicated: Secondary | ICD-10-CM | POA: Insufficient documentation

## 2011-11-11 DIAGNOSIS — F1120 Opioid dependence, uncomplicated: Secondary | ICD-10-CM | POA: Insufficient documentation

## 2011-11-11 DIAGNOSIS — I2510 Atherosclerotic heart disease of native coronary artery without angina pectoris: Secondary | ICD-10-CM | POA: Insufficient documentation

## 2011-11-11 DIAGNOSIS — R112 Nausea with vomiting, unspecified: Secondary | ICD-10-CM | POA: Insufficient documentation

## 2011-11-11 HISTORY — DX: Gastric ulcer, unspecified as acute or chronic, without hemorrhage or perforation: K25.9

## 2011-11-11 MED ORDER — OXYCODONE-ACETAMINOPHEN 5-325 MG PO TABS: 1.0000 | ORAL_TABLET | Freq: Four times a day (QID) | ORAL | Status: AC | PRN

## 2011-11-11 NOTE — Discharge Instructions (Signed)
Narcotic Withdrawal  When drug use interferes with normal living activities and relationships, it is abuse. Abuse includes problems with family and friends. Psychological dependence has developed when your mind tells you that the drug is needed. This is usually followed by a physical dependence in which you need more of the drug to get the same feeling or "high." This is known as addiction or chemical dependency. Risk is greater when chemical dependency exists in the family.  SYMPTOMS   When tolerance to narcotics has developed, stopping of the narcotic suddenly can cause uncomfortable physical symptoms. Most of the time these are mild and consist of shakes or jitters (tremors) in the hands,a rapid heart rate, rapid breathing, and temperature. Sometimes these symptoms are associated with anxiety, panic attacks, and bad dreams. Other symptoms include:   Irritability.   Anxiety.   Runny nose.   "Goose flesh."   Diarrhea.   Feeling sick to the stomach (nauseous).   Muscle spasms.   Sleeplessness.   Chills.   Sweats.   Drug cravings.   Confusion.  The severity of the withdrawal is based on the individual and varies from person to person. Many people choose to continue using narcotics to get rid of the discomfort of withdrawal. They also use to try to feel normal.  TREATMENT   Quitting an addiction means stopping use of all chemicals. This is hard but may save your life. With continual drug use, possible outcomes are often loss of self respect and esteem, violence, death, and eventually prison if the use of narcotics has led to the death of another.  Addiction cannot be cured, but it can be stopped. This often requires outside help and the care of professionals. Most hospitals and clinics can refer you to a specialized care center.  It is not necessary for you to go through the uncomfortable symptoms of withdrawal. Your caregiver can provide you with medications that will help you through this difficult  period. Try to avoid situations, friends, or alcohol, which may have made it possible for you to continue using narcotics in the past. Learn how to say no!  HOME CARE INSTRUCTIONS    Drink fluids, get plenty of rest, and take hot baths.   Medicines may be prescribed to help control withdrawal symptoms.   Over-the-counter medicines may be helpful to control diarrhea or an upset stomach.   If your problems resulted from taking prescription pain medicines, make sure you have a follow-up visit with your caregiver within the next few days. Be open about this problem.   If you are dependent or addicted to street drugs, contact a local drug and alcohol treatment center or Narcotics Anonymous.   Have someone with you to monitor your symptoms.   Engage in healthy activities with friends who do not use drugs.   Stay away from the drug scene.  It takes a long period of time to overcome addictions to all drugs. There may be times when you feel as though you want to use. Following loss of a physical addiction and going through withdrawal, you have conquered the most difficult part of getting rid of an addiction. Gradually, you will have a lessening of the craving that is telling you that you need narcotics to feel normal. Call your caregiver or a member of your support group if more support is needed. Learn who to talk to in your family and among your friends so that during these periods you can receive outside help.  SEEK IMMEDIATE   MEDICAL CARE IF:    You have vomiting that cannot be controlled, especially if you cannot keep liquids down.   You are seeing things or hearing voices that are not really there (hallucinating).   You have a seizure.  Document Released: 09/30/2002 Document Revised: 06/29/2011 Document Reviewed: 07/05/2008  [employer] Patient Information 2012 [employer].

## 2011-11-11 NOTE — ED Notes (Signed)
Pt reports being out of meds for pain snce yesterday am, is on roxicodone and demerol, stated she normally gets her meds from [facility], but has not seen them "in a long time".  Dr Judd Lien to see pt

## 2011-11-11 NOTE — ED Notes (Signed)
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 Pt c/o n/v, abd cramping, chills,

## 2011-11-11 NOTE — ED Provider Notes (Signed)
History  Scribed for Geoffery Lyons, MD, the patient was seen in room APA10/APA10. This chart was scribed by Candelaria Stagers. The patient's care started at 8:40 AM    CSN: [id number]  Arrival date & time 11/11/11  0102   First MD Initiated Contact with Patient 11/11/11 [phone]      Chief Complaint  Patient presents with  . Withdrawal     The history is provided by the patient.   Danielle Swanson is a 41 y.o. female who presents to the Emergency Department complaining of withdrawal sx that started yesterday after running out of Roxicodone that she has taken regularly for about one year.  She states that she missed her last appointment at [facility] and states that she needs a referral for a pain management clinic.  She is experiencing diarrhea, nausea, vomiting, diaphoresis, abdominal cramping, and chills.  She has h/o ulcers, arthritis, aneurysm, fibromyalgia, and tumors.  Nothing seems to help make the sx better or worse.     Past Medical History  Diagnosis Date  . Bipolar 1 disorder   . PTSD (post-traumatic stress disorder)   . Fibromyalgia   . Mass of left breast   . Arthritis   . Coronary artery disease   . Myocardial infarct   . Tumors   . Stomach ulcer     Past Surgical History  Procedure Date  . Abdominal hysterectomy     No family history on file.  History  Substance Use Topics  . Smoking status: Current Everyday Smoker -- 1.0 packs/day    Types: Cigarettes  . Smokeless tobacco: Not on file  . Alcohol Use: No    OB History    Grav Para Term Preterm Abortions TAB SAB Ect Mult Living                  Review of Systems  Constitutional: Positive for chills and diaphoresis.  Gastrointestinal: Positive for nausea, vomiting, abdominal pain and diarrhea.  All other systems reviewed and are negative.    Allergies  Aspirin; Haldol; Nsaids; Sulfa antibiotics; and Ultram  Home Medications   Current Outpatient Rx  Name Route Sig Dispense Refill  . ALPRAZOLAM 1  MG PO TABS Oral Take 1 mg by mouth 2 (two) times daily.     Marland Kitchen VITAMIN B 12 PO Oral Take 1 tablet by mouth daily. Unknown strength.     . ESTRADIOL 0.5 MG PO TABS Oral Take 0.5 mg by mouth daily.     Marland Kitchen GABAPENTIN 300 MG PO CAPS Oral Take 300 mg by mouth 3 (three) times daily.     Marland Kitchen GLUCOSAMINE PO Oral Take 1 tablet by mouth daily. Patient states strength is either 300mg  or 400mg  (not sure)    . LISINOPRIL 5 MG PO TABS Oral Take 5 mg by mouth daily.     Carma Leaven M PLUS PO TABS Oral Take 1 tablet by mouth daily.     . OXYCODONE HCL 30 MG PO TABS Oral Take 30 mg by mouth every 8 (eight) hours as needed.        BP 114/71  Pulse 90  Temp(Src) 97.6 F (36.4 C) (Oral)  Resp 18  Ht 5' 3.5" (1.613 m)  Wt 108 lb (48.988 kg)  BMI 18.83 kg/m2  SpO2 95%  Physical Exam  Nursing note and vitals reviewed. Constitutional: She is oriented to person, place, and time. She appears well-developed and well-nourished. No distress.  HENT:  Head: Normocephalic and atraumatic.  Eyes: EOM  are normal. Right eye exhibits no discharge. Left eye exhibits no discharge.  Neck: Normal range of motion. Neck supple.  Cardiovascular: Normal rate and regular rhythm.  Exam reveals no friction rub.   No murmur heard. Pulmonary/Chest: Effort normal and breath sounds normal. She has no wheezes. She has no rales.  Abdominal: Soft. Bowel sounds are normal.  Musculoskeletal: Normal range of motion. She exhibits no edema and no tenderness.  Neurological: She is alert and oriented to person, place, and time.  Skin: Skin is warm and dry. She is not diaphoretic.  Psychiatric: She has a normal mood and affect. Her behavior is normal.    ED Course  Procedures   DIAGNOSTIC STUDIES: Oxygen Saturation is 100% on room air, normal by my interpretation.    COORDINATION OF CARE:  8:50AM Ordered: oxyCODONE-acetaminophen (PERCOCET) 5-325 MG per tablet    Labs Reviewed - No data to display No results found.   No diagnosis  found.    MDM  I reluctantly will prescribe a small quantity of percocet.  She has been informed that all of her pain medications absolutely must come from a primary doctor.  I will not prescribe her further narcotics.   I personally performed the services described in this documentation, which was scribed in my presence. The recorded information has been reviewed and considered.          Geoffery Lyons, MD 11/13/11 [phone]

## 2011-11-22 ENCOUNTER — Encounter (HOSPITAL_COMMUNITY): Payer: Self-pay | Admitting: *Deleted

## 2011-11-22 ENCOUNTER — Emergency Department (HOSPITAL_COMMUNITY)
Admission: EM | Admit: 2011-11-22 | Discharge: 2011-11-22 | Disposition: A | Discharge status: Home / Self Care | Payer: Medicaid Other | Attending: Emergency Medicine | Admitting: Emergency Medicine

## 2011-11-22 ENCOUNTER — Emergency Department (HOSPITAL_COMMUNITY): Payer: Medicaid Other

## 2011-11-22 DIAGNOSIS — Z79899 Other long term (current) drug therapy: Secondary | ICD-10-CM | POA: Insufficient documentation

## 2011-11-22 DIAGNOSIS — R52 Pain, unspecified: Secondary | ICD-10-CM | POA: Insufficient documentation

## 2011-11-22 DIAGNOSIS — M129 Arthropathy, unspecified: Secondary | ICD-10-CM | POA: Insufficient documentation

## 2011-11-22 DIAGNOSIS — I2510 Atherosclerotic heart disease of native coronary artery without angina pectoris: Secondary | ICD-10-CM | POA: Insufficient documentation

## 2011-11-22 DIAGNOSIS — S20219A Contusion of unspecified front wall of thorax, initial encounter: Secondary | ICD-10-CM | POA: Insufficient documentation

## 2011-11-22 DIAGNOSIS — IMO0001 Reserved for inherently not codable concepts without codable children: Secondary | ICD-10-CM | POA: Insufficient documentation

## 2011-11-22 DIAGNOSIS — R071 Chest pain on breathing: Secondary | ICD-10-CM | POA: Insufficient documentation

## 2011-11-22 DIAGNOSIS — F319 Bipolar disorder, unspecified: Secondary | ICD-10-CM | POA: Insufficient documentation

## 2011-11-22 DIAGNOSIS — F17200 Nicotine dependence, unspecified, uncomplicated: Secondary | ICD-10-CM | POA: Insufficient documentation

## 2011-11-22 MED ORDER — DIAZEPAM 5 MG/ML IJ SOLN
5.0000 mg | Freq: Once | INTRAMUSCULAR | Status: AC
  Administered 2011-11-22: 10 mg via INTRAMUSCULAR
  Filled 2011-11-22: qty 2

## 2011-11-22 MED ORDER — HYDROMORPHONE HCL PF 1 MG/ML IJ SOLN
1.0000 mg | Freq: Once | INTRAMUSCULAR | Status: AC
  Administered 2011-11-22: 1 mg via INTRAMUSCULAR
  Filled 2011-11-22: qty 1

## 2011-11-22 NOTE — Discharge Instructions (Signed)
Chest Contusion A contusion is a deep bruise. Bruises happen when an injury causes bleeding under the skin. Signs of bruising include pain, puffiness (swelling), and discolored skin. The bruise may turn blue, purple, or yellow. Pay attention to how you are doing. HOME CARE  Put ice on the injured area.   Put ice in a plastic bag.   Place a towel between the skin and the bag.   Leave the ice on for 15 to 20 minutes at a time, 3 to 4 times a day for the first 48 hours.   Rest.   Do not lift anything heavy.   Limit your activity as told by your doctor   Take 3 to 4 deep breaths every hour while awake. Hold your hand or a pillow over the sore area for support.   Breathe from the belly (abdomen).   Breathe in through the nose, as if you are smelling a flower.   Breathe out through the mouth, as if you are blowing out a candle.   Only take medicine as told by your doctor.  GET HELP RIGHT AWAY IF:   You have trouble breathing or cough up thick spit (mucus).   You have chest pain that goes into the arms or jaw.   The skin is wet and pale.   You have a fever.   You feel dizzy, weak, or pass out (faint).   You cannot breathe easily.   The bruise is getting worse.  MAKE SURE YOU:   Understand these instructions.   Will watch your condition.   Will get help right away if you are not doing well or get worse.  Document Released: 12/27/2007 Document Revised: 06/29/2011 Document Reviewed: 12/27/2007 [employer] Patient Information 2012 [employer]. 
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
## 2011-11-22 NOTE — ED Notes (Signed)
Patient upset because she wanted meds for withdrawal, advised to followup with her doctor
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
## 2011-11-29 ENCOUNTER — Other Ambulatory Visit: Payer: Self-pay | Admitting: Thoracic Surgery

## 2011-11-29 DIAGNOSIS — J984 Other disorders of lung: Secondary | ICD-10-CM

## 2011-12-22 ENCOUNTER — Other Ambulatory Visit: Payer: Self-pay | Admitting: Family Medicine

## 2011-12-22 DIAGNOSIS — Z1231 Encounter for screening mammogram for malignant neoplasm of breast: Secondary | ICD-10-CM

## 2012-01-17 ENCOUNTER — Encounter: Payer: Self-pay | Admitting: Thoracic Surgery

## 2012-01-17 ENCOUNTER — Inpatient Hospital Stay: Admission: RE | Admit: 2012-01-17 | Payer: Medicaid Other | Source: Ambulatory Visit

## 2012-01-17 ENCOUNTER — Ambulatory Visit (INDEPENDENT_AMBULATORY_CARE_PROVIDER_SITE_OTHER): Payer: Medicaid Other | Admitting: Thoracic Surgery

## 2012-01-17 DIAGNOSIS — R599 Enlarged lymph nodes, unspecified: Secondary | ICD-10-CM

## 2012-01-17 NOTE — Progress Notes (Signed)
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 He still has some soreness in his chest incision. I do not palpate any mass. His prosthesis is in good position. Since his been over 8 years we will see him back again as needed. Current Outpatient Prescriptions  Medication Sig Dispense Refill  . ALPRAZolam (XANAX) 1 MG tablet Take 1 mg by mouth 2 (two) times daily.       . Cyanocobalamin (VITAMIN B 12 PO) Take 1 tablet by mouth daily. Unknown strength.       . estradiol (ESTRACE) 0.5 MG tablet Take 0.5 mg by mouth daily.       Marland Kitchen gabapentin (NEURONTIN) 300 MG capsule Take 300 mg by mouth 3 (three) times daily.       Marland Kitchen GLUCOSAMINE PO Take 1 tablet by mouth daily. Patient states strength is either 300mg  or 400mg  (not sure)      . lisinopril (PRINIVIL,ZESTRIL) 5 MG tablet Take 5 mg by mouth daily.       . Multiple Vitamins-Minerals (MULTIVITAMINS THER. W/MINERALS) TABS Take 1 tablet by mouth daily.       Marland Kitchen oxycodone (ROXICODONE) 30 MG immediate release tablet Take 30 mg by mouth every 8 (eight) hours as needed.           Review of Systems: Some mild tenderness left chest wall   Physical Exam lungs were clear to auscultation percussion no palpable masses well-healed left chest wall in resection   Diagnostic Tests: CT scan showed no evidence of recurrence   Impression: Status post left chest wall resection for hemangioendothelioma   Plan: Return when necessary

## 2012-01-22 ENCOUNTER — Ambulatory Visit (HOSPITAL_COMMUNITY): Payer: Medicaid Other | Admitting: Physical Therapy

## 2012-02-05 ENCOUNTER — Inpatient Hospital Stay (HOSPITAL_COMMUNITY): Admission: RE | Admit: 2012-02-05 | Payer: Medicaid Other | Source: Ambulatory Visit | Admitting: Physical Therapy

## 2012-02-08 ENCOUNTER — Ambulatory Visit: Payer: Medicaid Other | Admitting: Cardiothoracic Surgery

## 2012-02-08 ENCOUNTER — Inpatient Hospital Stay: Admission: RE | Admit: 2012-02-08 | Payer: Medicaid Other | Source: Ambulatory Visit

## 2012-02-22 ENCOUNTER — Encounter (HOSPITAL_COMMUNITY): Payer: Self-pay

## 2012-02-22 ENCOUNTER — Emergency Department (HOSPITAL_COMMUNITY)
Admission: EM | Admit: 2012-02-22 | Discharge: 2012-02-22 | Disposition: A | Discharge status: Home / Self Care | Payer: Medicaid Other | Attending: Emergency Medicine | Admitting: Emergency Medicine

## 2012-02-22 DIAGNOSIS — Z882 Allergy status to sulfonamides status: Secondary | ICD-10-CM | POA: Insufficient documentation

## 2012-02-22 DIAGNOSIS — M129 Arthropathy, unspecified: Secondary | ICD-10-CM | POA: Insufficient documentation

## 2012-02-22 DIAGNOSIS — F17200 Nicotine dependence, unspecified, uncomplicated: Secondary | ICD-10-CM | POA: Insufficient documentation

## 2012-02-22 DIAGNOSIS — R51 Headache: Secondary | ICD-10-CM | POA: Insufficient documentation

## 2012-02-22 DIAGNOSIS — F319 Bipolar disorder, unspecified: Secondary | ICD-10-CM | POA: Insufficient documentation

## 2012-02-22 DIAGNOSIS — G8929 Other chronic pain: Secondary | ICD-10-CM | POA: Insufficient documentation

## 2012-02-22 DIAGNOSIS — Z91199 Patient's noncompliance with other medical treatment and regimen due to unspecified reason: Secondary | ICD-10-CM | POA: Insufficient documentation

## 2012-02-22 DIAGNOSIS — Z9119 Patient's noncompliance with other medical treatment and regimen: Secondary | ICD-10-CM | POA: Insufficient documentation

## 2012-02-22 MED ORDER — ONDANSETRON 4 MG PO TBDP
8.0000 mg | ORAL_TABLET | Freq: Once | ORAL | Status: AC
  Administered 2012-02-22: 8 mg via ORAL
  Filled 2012-02-22: qty 2

## 2012-02-22 MED ORDER — OXYCODONE-ACETAMINOPHEN 5-325 MG PO TABS
1.0000 | ORAL_TABLET | Freq: Once | ORAL | Status: AC
  Administered 2012-02-22: 1 via ORAL
  Filled 2012-02-22: qty 1

## 2012-02-22 MED ORDER — HYDROMORPHONE HCL PF 1 MG/ML IJ SOLN
1.0000 mg | Freq: Once | INTRAMUSCULAR | Status: AC
  Administered 2012-02-22: 1 mg via INTRAMUSCULAR
  Filled 2012-02-22: qty 1

## 2012-02-22 MED ORDER — ONDANSETRON HCL 4 MG PO TABS: 4.0000 mg | ORAL_TABLET | Freq: Four times a day (QID) | ORAL | Status: DC
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
## 2012-02-23 ENCOUNTER — Emergency Department (HOSPITAL_COMMUNITY)
Admission: EM | Admit: 2012-02-23 | Discharge: 2012-02-23 | Disposition: A | Discharge status: Home / Self Care | Payer: Medicaid Other | Source: Home / Self Care | Attending: Emergency Medicine | Admitting: Emergency Medicine

## 2012-02-23 ENCOUNTER — Encounter (HOSPITAL_COMMUNITY): Payer: Self-pay | Admitting: Emergency Medicine

## 2012-02-23 ENCOUNTER — Emergency Department (HOSPITAL_COMMUNITY): Payer: Medicaid Other

## 2012-02-23 ENCOUNTER — Emergency Department (HOSPITAL_COMMUNITY)
Admission: EM | Admit: 2012-02-23 | Discharge: 2012-02-24 | Disposition: A | Discharge status: Home / Self Care | Payer: Medicaid Other | Attending: Emergency Medicine | Admitting: Emergency Medicine

## 2012-02-23 ENCOUNTER — Encounter (HOSPITAL_COMMUNITY): Payer: Self-pay | Admitting: *Deleted

## 2012-02-23 DIAGNOSIS — R45851 Suicidal ideations: Secondary | ICD-10-CM | POA: Insufficient documentation

## 2012-02-23 DIAGNOSIS — G8929 Other chronic pain: Secondary | ICD-10-CM | POA: Insufficient documentation

## 2012-02-23 DIAGNOSIS — F4310 Post-traumatic stress disorder, unspecified: Secondary | ICD-10-CM | POA: Insufficient documentation

## 2012-02-23 DIAGNOSIS — F319 Bipolar disorder, unspecified: Secondary | ICD-10-CM | POA: Insufficient documentation

## 2012-02-23 DIAGNOSIS — M129 Arthropathy, unspecified: Secondary | ICD-10-CM | POA: Insufficient documentation

## 2012-02-23 DIAGNOSIS — I252 Old myocardial infarction: Secondary | ICD-10-CM | POA: Insufficient documentation

## 2012-02-23 DIAGNOSIS — Z8739 Personal history of other diseases of the musculoskeletal system and connective tissue: Secondary | ICD-10-CM | POA: Insufficient documentation

## 2012-02-23 DIAGNOSIS — F17200 Nicotine dependence, unspecified, uncomplicated: Secondary | ICD-10-CM | POA: Insufficient documentation

## 2012-02-23 DIAGNOSIS — Z79899 Other long term (current) drug therapy: Secondary | ICD-10-CM | POA: Insufficient documentation

## 2012-02-23 DIAGNOSIS — IMO0001 Reserved for inherently not codable concepts without codable children: Secondary | ICD-10-CM | POA: Insufficient documentation

## 2012-02-23 DIAGNOSIS — F1110 Opioid abuse, uncomplicated: Secondary | ICD-10-CM | POA: Insufficient documentation

## 2012-02-23 DIAGNOSIS — I2510 Atherosclerotic heart disease of native coronary artery without angina pectoris: Secondary | ICD-10-CM | POA: Insufficient documentation

## 2012-02-23 LAB — CBC WITH DIFFERENTIAL/PLATELET
Basophils Relative: 1 % (ref 0–1)
Eosinophils Absolute: 0.5 10*3/uL (ref 0.0–0.7)
Eosinophils Relative: 6 % — ABNORMAL HIGH (ref 0–5)
Hemoglobin: 13.5 g/dL (ref 12.0–15.0)
Lymphs Abs: 4 10*3/uL (ref 0.7–4.0)
MCH: 31.6 pg (ref 26.0–34.0)
MCHC: 35.2 g/dL (ref 30.0–36.0)
MCV: 89.9 fL (ref 78.0–100.0)
Monocytes Relative: 9 % (ref 3–12)
Neutrophils Relative %: 36 % — ABNORMAL LOW (ref 43–77)
Platelets: 299 10*3/uL (ref 150–400)
RBC: 4.27 MIL/uL (ref 3.87–5.11)

## 2012-02-23 LAB — BASIC METABOLIC PANEL
BUN: 11 mg/dL (ref 6–23)
CO2: 26 mEq/L (ref 19–32)
Calcium: 9.2 mg/dL (ref 8.4–10.5)
Creatinine, Ser: 0.71 mg/dL (ref 0.50–1.10)
GFR calc non Af Amer: >90 mL/min (ref >90)
Glucose, Bld: 85 mg/dL (ref 70–99)

## 2012-02-23 LAB — ETHANOL: Alcohol, Ethyl (B): <11 mg/dL (ref 0–11)

## 2012-02-23 LAB — RAPID URINE DRUG SCREEN, HOSP PERFORMED
Barbiturates: NOT DETECTED
Cocaine: POSITIVE — AB
Tetrahydrocannabinol: NOT DETECTED

## 2012-02-23 LAB — URINALYSIS, ROUTINE W REFLEX MICROSCOPIC
Bilirubin Urine: NEGATIVE
Glucose, UA: NEGATIVE mg/dL
Hgb urine dipstick: NEGATIVE
Ketones, ur: NEGATIVE mg/dL
Protein, ur: NEGATIVE mg/dL
Urobilinogen, UA: 0.2 mg/dL (ref 0.0–1.0)

## 2012-02-23 MED ORDER — ALPRAZOLAM 1 MG PO TABS
1.0000 mg | ORAL_TABLET | Freq: Three times a day (TID) | ORAL | Status: DC
  Administered 2012-02-23 (×2): 1 mg via ORAL
  Filled 2012-02-23 (×2): qty 1

## 2012-02-23 MED ORDER — ESTRADIOL 1 MG PO TABS
1.0000 mg | ORAL_TABLET | Freq: Every day | ORAL | Status: DC
  Filled 2012-02-23 (×2): qty 1

## 2012-02-23 MED ORDER — GABAPENTIN 300 MG PO CAPS
300.0000 mg | ORAL_CAPSULE | Freq: Three times a day (TID) | ORAL | Status: DC
  Administered 2012-02-23: 300 mg via ORAL
  Filled 2012-02-23 (×2): qty 1

## 2012-02-23 MED ORDER — GABAPENTIN 300 MG PO CAPS
600.0000 mg | ORAL_CAPSULE | Freq: Four times a day (QID) | ORAL | Status: DC
  Administered 2012-02-23: 600 mg via ORAL
  Filled 2012-02-23 (×5): qty 2

## 2012-02-23 MED ORDER — PANTOPRAZOLE SODIUM 20 MG PO TBEC
20.0000 mg | DELAYED_RELEASE_TABLET | Freq: Every day | ORAL | Status: DC
  Filled 2012-02-23: qty 1

## 2012-02-23 MED ORDER — NICOTINE 21 MG/24HR TD PT24
21.0000 mg | MEDICATED_PATCH | Freq: Every day | TRANSDERMAL | Status: DC
  Administered 2012-02-23: 21 mg via TRANSDERMAL
  Filled 2012-02-23: qty 1

## 2012-02-23 MED ORDER — ONDANSETRON HCL 4 MG PO TABS: 4.0000 mg | ORAL_TABLET | Freq: Three times a day (TID) | ORAL | Status: DC | PRN

## 2012-02-23 MED ORDER — PROMETHAZINE HCL 25 MG PO TABS
25.0000 mg | ORAL_TABLET | Freq: Once | ORAL | Status: AC
  Administered 2012-02-23: 25 mg via ORAL
  Filled 2012-02-23: qty 1

## 2012-02-23 MED ORDER — TRAZODONE HCL 50 MG PO TABS: 50.0000 mg | ORAL_TABLET | Freq: Every evening | ORAL | Status: DC | PRN

## 2012-02-23 NOTE — ED Provider Notes (Signed)
History     CSN: [id number]  Arrival date & time 02/23/12  1551   First MD Initiated Contact with Patient 02/23/12 1609      Chief Complaint  Patient presents with  . Medical Clearance    (Consider location/radiation/quality/duration/timing/severity/associated sxs/prior treatment) The history is provided by the patient. No language interpreter was used.   Pt here with suicidal ideations today. States that she want to cut her wrist.   Anniversary of baby's death who died of suffication in her crib at 65 months of age many years ago.   Pt was at [facility] this am looking for pain meds because she fell coming out of the pain clinic and they would no give her meds.  She is on demerol 100mg  po and roxicodone 30mg  po for chronic pain from fibromyalgia, lung mass, breast mass and Osteoarthritis.States that she took a percocet this am.  Last pain meds x 3 days.  C/o nausea.  No c/o back pain presently.   pcp is Georgette Shell np  And she goes to the [facility].  Past Medical History  Diagnosis Date  . Bipolar 1 disorder   . PTSD (post-traumatic stress disorder)   . Fibromyalgia   . Mass of left breast   . Arthritis   . Coronary artery disease   . Myocardial infarct   . Tumors   . Stomach ulcer     Past Surgical History  Procedure Date  . Abdominal hysterectomy     No family history on file.  History  Substance Use Topics  . Smoking status: Current Everyday Smoker -- 1.0 packs/day    Types: Cigarettes  . Smokeless tobacco: Not on file  . Alcohol Use: No    OB History    Grav Para Term Preterm Abortions TAB SAB Ect Mult Living                  Review of Systems  Constitutional: Negative.   HENT: Negative.   Eyes: Negative.   Respiratory: Negative.   Cardiovascular: Negative.   Gastrointestinal: Negative.   Neurological: Negative.   Psychiatric/Behavioral: Negative.   All other systems reviewed and are negative.    Allergies  Aspirin; Haldol; Nsaids; Sulfa  antibiotics; and Ultram  Home Medications   Current Outpatient Rx  Name Route Sig Dispense Refill  . ALPRAZOLAM 1 MG PO TABS Oral Take 1 mg by mouth 3 (three) times daily as needed. anxiety    . VITAMIN B 12 PO Oral Take 1 tablet by mouth daily. Unknown strength.    . ESTRADIOL 0.5 MG PO TABS Oral Take 0.5 mg by mouth daily.     Marland Kitchen GABAPENTIN 600 MG PO TABS Oral Take 600 mg by mouth 4 (four) times daily.    Marland Kitchen GLUCOSAMINE PO Oral Take 1 tablet by mouth daily. Patient states strength is either 300mg  or 400mg  (not sure)    . LISINOPRIL 5 MG PO TABS Oral Take 5 mg by mouth daily.     Marland Kitchen MEPERIDINE HCL 100 MG PO TABS Oral Take 100 mg by mouth daily as needed. For migraines    . THERA M PLUS PO TABS Oral Take 1 tablet by mouth daily.     . OXYCODONE HCL 30 MG PO TABS Oral Take 30 mg by mouth every 8 (eight) hours as needed. For pain    . PROMETHAZINE HCL 25 MG PO TABS Oral Take 25 mg by mouth every 6 (six) hours as needed. Nausea    .  PROMETHAZINE HCL 25 MG PO TABS Oral Take 1 tablet (25 mg total) by mouth every 6 (six) hours as needed for nausea. 20 tablet 0  . PROMETHAZINE HCL 25 MG PO TABS Oral Take 0.5 tablets (12.5 mg total) by mouth every 6 (six) hours as needed for nausea. 10 tablet 0    BP 96/53  Pulse 87  Temp 98.3 F (36.8 C) (Oral)  Resp 91  SpO2 99%  Physical Exam  Nursing note and vitals reviewed. Constitutional: She is oriented to person, place, and time. She appears well-developed and well-nourished.  HENT:  Head: Normocephalic.  Eyes: Conjunctivae and EOM are normal. Pupils are equal, round, and reactive to light.  Neck: Normal range of motion. Neck supple.  Cardiovascular: Normal rate.   Pulmonary/Chest: Effort normal.  Abdominal: Soft.  Musculoskeletal: Normal range of motion.  Neurological: She is alert and oriented to person, place, and time.  Skin: Skin is warm and dry.  Psychiatric: She has a normal mood and affect.    ED Course  Procedures (including  critical care time)   Labs Reviewed  CBC WITH DIFFERENTIAL  URINALYSIS, ROUTINE W REFLEX MICROSCOPIC  BASIC METABOLIC PANEL  URINE RAPID DRUG SCREEN (HOSP PERFORMED)  ETHANOL   Dg Thoracic Spine 2 View  02/23/2012  *RADIOLOGY REPORT*  Clinical Data: Fall.  Back pain.  Arm tingling.  THORACIC SPINE - 2 VIEW  Comparison: None.  Findings: Alignment is normal.  The no evidence of fracture.  No paravertebral swelling.  Posteromedial ribs appear normal.  IMPRESSION: Negative radiographs.  No traumatic finding.  Original Report Authenticated By: Thomasenia Sales, M.D.     No diagnosis found.    MDM  01am Suicidal wants to cut her wrist and chronic pain management.  Anniversary of baby's death.  ? Drug seeking earlier today and yesterday.  Has suboxone in system so [facility].  Report given to Dr. Preston Fleeting. Awaiting telepsyche.          Remi Haggard, NP 02/24/12 [phone]
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
## 2012-02-23 NOTE — ED Notes (Signed)
Pt states she is terminal ill and does not want to live anymore. Pt states she has a si plan of cutting her wrist. Pt states she is also in pain

## 2012-02-23 NOTE — ED Notes (Signed)
Pt states she had lung cancer and tumors all over her body.pt is ask for pain medication. Pt was explain she is here for medical clearance

## 2012-02-23 NOTE — Progress Notes (Signed)
[facility] ED CM contacted by [facility] ED CM at 1517 02/23/12 stating pt was at [facility] ED requesting pain medications.  "Pt states she fell at pain clinic and needs pain med, explained to pt that she is under contract at pain clinic and dr would not give her pain med here" [facility] ED Cm updated [facility] ED Triage RN who called when pt arrived. [facility] ED Cm updated ED triage staff of call from [facility] prior to pt arrival.

## 2012-02-23 NOTE — ED Notes (Signed)
Pt requesting pain med, told pt that we would not be giving her pain med, pt states she fell at pain clinic and needs pain med, explained to pt that she is under contract at pain clinic and dr would not give her pain med here, patient becoming agitated, states her father wants her admitted to hospital for pain control, requests to be transferred to another hospital, states she is terminally ill and needs pain med

## 2012-02-23 NOTE — ED Notes (Addendum)
Pt has asked several times about getting pain meds because she has a lot of medical problems and had narcotics prescribed to her and she doesn't want to go through withdrawal. However, she also admits she hasn't used narcotics in three days and her UDS was negative for opiates. Pt was told we probably wouldn't give her narcotics and that ativan and clonidine are used for detox. Pt reports she can't take clonidine because it knocked her hip out when she took it in the past. Pt also said if she doesn't get pain medication by tomorrow she my as well just leave.

## 2012-02-23 NOTE — Progress Notes (Signed)
pt sees Hotel manager PA for [employer]ger per pt [facility] [address] [place] [address] Phone: [phone]

## 2012-02-23 NOTE — ED Notes (Signed)
Pt up to the nursing station complaining of pain all over her body 7/10 and she said she has to have something for pain. Also, her BP medication wasn't ordered and the Gabapentin was ordered at a lesser dose. Tried to call EDP but it rolled over, will try again or report to next shift.

## 2012-02-23 NOTE — ED Notes (Signed)
Case manager went in to see pt and found pt not in room, no personal belongings in room, pt not found in department. Dr Rubin Payor award

## 2012-02-23 NOTE — ED Notes (Addendum)
Pt sts fell over curb at pain clinic; pt c/o lower back pain and shoulder pain; pt sts out of pain meds; pt here yesterday for same; pt ambulatory; pt sts increased anxiety due to being out of xanax
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
## 2012-02-23 NOTE — ED Notes (Signed)
Pt has 2 pt belongings bags. Pt has 3 sliver rings, one sliver necklace, and pt has ring in her eye lip. Unable to unscrew eye ring

## 2012-02-23 NOTE — ED Provider Notes (Signed)
Sleepy arousable easily no distress. Lisinopril not restarted given pts bp while here. Does not appear to be in withdrawal of either narcotic or benzodiezepines  Doug Sou, MD 02/23/12 2041

## 2012-02-23 NOTE — ED Notes (Signed)
Pt started to become very upset with nurse because a she states we are not treating her fair.

## 2012-02-23 NOTE — ED Notes (Signed)
Up to bathroom

## 2012-02-23 NOTE — ED Provider Notes (Signed)
Medical screening examination/treatment/procedure(s) were performed by non-physician practitioner and as supervising physician I was immediately available for consultation/collaboration.  Latecia Miler, MD 02/23/12 0914 

## 2012-02-23 NOTE — ED Notes (Signed)
Pt ambulated to BR, no assistance needed, steady on feet

## 2012-02-24 MED ORDER — HYDROMORPHONE HCL PF 2 MG/ML IJ SOLN: 1.0000 mg | Freq: Once | INTRAMUSCULAR | Status: DC

## 2012-02-24 MED ORDER — ONDANSETRON HCL 4 MG/2ML IJ SOLN: 4.0000 mg | Freq: Once | INTRAMUSCULAR | Status: DC

## 2012-02-24 MED ORDER — OXYCODONE HCL 10 MG PO TABS: 10.0000 mg | ORAL_TABLET | Freq: Three times a day (TID) | ORAL | Status: DC | PRN

## 2012-02-24 NOTE — ED Provider Notes (Signed)
History/physical exam/procedure(s) were performed by non-physician practitioner and as supervising physician I was immediately available for consultation/collaboration. I have reviewed all notes and am in agreement with care and plan.   Breiana Stratmann S Jobe Mutch, MD 02/24/12 1532 

## 2012-02-24 NOTE — BH Assessment (Signed)
Assessment Note   Patient is a 41 year old white female. Patient was brought to the ER by the police due to her making statements that she was going to cut her wrist due to feelings of depression associated with the death of her child in 55.  As well as the death of her fiance in 2004/01/16.  Patient reports feelings of increased depression.  Patient denied any SI/HI.  Patient reports that she no longer wants to hurt herself.  Patient denies any psychosis.  Patient denies any substance abuse. 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  However her UDS positive for cocaine and benzos.  Patient reports that there is nothing in her system because she has not used any drugs.  Patient reports that she needs pain pills due to having tumors in her back and lungs.  Patient reports that her [place] bus station [place], 2012.  Therefore, she now needs a refill on her pain medication.  Patient refused to talk about past substance abuse usage.      Axis I: Bipolar, Depressed and Substance Abuse Axis II: Deferred Axis III:  Past Medical History  Diagnosis Date  . Bipolar 1 disorder   . PTSD (post-traumatic stress disorder)   . Fibromyalgia   . Mass of left breast   . Arthritis   . Coronary artery disease   . Myocardial infarct   . Tumors   . Stomach ulcer    Axis IV: economic problems, educational problems, housing problems, occupational problems, other psychosocial or environmental problems, problems related to social environment, problems with access to health care services and problems with primary support group Axis V: 41-50 serious symptoms  Past Medical History:  Past Medical History  Diagnosis Date  . Bipolar 1 disorder   . PTSD (post-traumatic stress disorder)   . Fibromyalgia   . Mass of left breast   . Arthritis   . Coronary artery disease   . Myocardial infarct   . Tumors   . Stomach ulcer     Past Surgical History  Procedure Date  .  Abdominal hysterectomy     Family History: No family history on file.  Social History:  reports that she has been smoking Cigarettes.  She has been smoking about 1 pack per day. She does not have any smokeless tobacco history on file. She reports that she does not drink alcohol or use illicit drugs.  Additional Social History:     CIWA: CIWA-Ar BP: 91/58 mmHg Pulse Rate: 71  COWS:    Allergies:  Allergies  Allergen Reactions  . Aspirin Other (See Comments)    Thins blood, swells throat  . Haldol (Haloperidol Decanoate) Other (See Comments)    Causes Stiffness and drawing up  . Nsaids Other (See Comments)    Ulcers prohibit patient from being able to take the following medication  . Sulfa Antibiotics Other (See Comments)    Blisters skin  . Ultram (Tramadol Hcl) Nausea Only    "biting" sensation within stomach    Home Medications:  (Not in a hospital admission)  OB/GYN Status:  No LMP recorded. Patient has had a hysterectomy.  General Assessment Data Location of Assessment: [facility] ED ACT Assessment: Yes Living Arrangements: Parent Can pt return to current living arrangement?: Yes Admission Status: Voluntary Is patient capable of signing voluntary admission?: Yes Transfer from: Home Referral Source: Self/Family/Friend  Education Status Is patient currently in school?: No  Risk  to self Suicidal Ideation: No-Not Currently/Within Last 6 Months Suicidal Intent: No-Not Currently/Within Last 6 Months Is patient at risk for suicide?: Yes Suicidal Plan?: No-Not Currently/Within Last 6 Months Access to Means: Yes Specify Access to Suicidal Means: knife What has been your use of drugs/alcohol within the last 12 months?: denies any substance abuse Previous Attempts/Gestures: Yes How many times?: 1  Other Self Harm Risks: none reported Triggers for Past Attempts: Unpredictable;Unknown Intentional Self Injurious Behavior: None Family Suicide History: No Recent stressful life  event(s): Other (Comment) Persecutory voices/beliefs?: No Depression: Yes Depression Symptoms: Isolating;Feeling worthless/self pity;Feeling angry/irritable Substance abuse history and/or treatment for substance abuse?: Yes Suicide prevention information given to non-admitted patients: Yes  Risk to Others Homicidal Ideation: No Thoughts of Harm to Others: No Current Homicidal Intent: No Current Homicidal Plan: No Access to Homicidal Means: No Identified Victim: none reported History of harm to others?: No Assessment of Violence: None Noted Violent Behavior Description: none reported Does patient have access to weapons?: No Criminal Charges Pending?: No Does patient have a court date: No  Psychosis Hallucinations: None noted Delusions: None noted  Mental Status Report Appear/Hygiene: Disheveled Eye Contact: Fair Motor Activity: Restlessness Speech: Pressured Level of Consciousness: Quiet/awake Mood: Anxious;Apprehensive Affect: Anxious Anxiety Level: Minimal Thought Processes: Coherent;Relevant Judgement: Unimpaired Orientation: Person;Place;Time;Situation Obsessive Compulsive Thoughts/Behaviors: None  Cognitive Functioning Concentration: Decreased Memory: Recent Intact;Remote Intact IQ: Average Insight: Fair Impulse Control: Poor Appetite: Fair Weight Loss: 0  Weight Gain: 0  Sleep: Decreased Total Hours of Sleep: 4  Vegetative Symptoms: None  ADLScreening [facility] Services) Patient's cognitive ability adequate to safely complete daily activities?: Yes Patient able to express need for assistance with ADLs?: Yes Independently performs ADLs?: Yes  Abuse/Neglect [facility]) Physical Abuse: Denies Verbal Abuse: Denies Sexual Abuse: Denies  Prior Inpatient Therapy Prior Inpatient Therapy: Yes Prior Therapy Dates: 1995 and 2005 Prior Therapy Facilty/Provider(s): [facility] Reason for Treatment: depression   Prior Outpatient Therapy Prior Outpatient Therapy:  No Prior Therapy Dates: ongoing  Prior Therapy Facilty/Provider(s): [facility]  Reason for Treatment: depression mood swings   ADL Screening (condition at time of admission) Patient's cognitive ability adequate to safely complete daily activities?: Yes Patient able to express need for assistance with ADLs?: Yes Independently performs ADLs?: Yes       Abuse/Neglect Assessment (Assessment to be complete while patient is alone) Physical Abuse: Denies Verbal Abuse: Denies Sexual Abuse: Denies Values / Beliefs Cultural Requests During Hospitalization: None Spiritual Requests During Hospitalization: None        Additional Information 1:1 In Past 12 Months?: No CIRT Risk: No Elopement Risk: No Does patient have medical clearance?: Yes     Disposition: Pending Tele Psych recommendations.  Disposition Disposition of Patient: Referred to (Pending Telepsych recommendation ) Patient referred to: Other (Comment) (Pending Telepsych recomendation)  On Site Evaluation by:   Reviewed with Physician:     Phillip Heal LaVerne 02/24/2012 12:39 AM

## 2012-02-24 NOTE — ED Notes (Signed)
Patient cursing and swearing that she can't walk and needs her pain medication. Patient refuses to have her vital signs done.
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
## 2012-02-24 NOTE — ED Notes (Signed)
Patient discharge via ambulatory with steady gait. Respirations equal and unlabored. Skin warm and dry. No acute distress noted. 

## 2012-03-01 ENCOUNTER — Emergency Department (HOSPITAL_COMMUNITY)
Admission: EM | Admit: 2012-03-01 | Discharge: 2012-03-02 | Disposition: A | Discharge status: Home / Self Care | Payer: Medicaid Other | Attending: Emergency Medicine | Admitting: Emergency Medicine

## 2012-03-01 ENCOUNTER — Encounter (HOSPITAL_COMMUNITY): Payer: Self-pay | Admitting: *Deleted

## 2012-03-01 ENCOUNTER — Emergency Department (HOSPITAL_COMMUNITY): Payer: Medicaid Other

## 2012-03-01 DIAGNOSIS — R51 Headache: Secondary | ICD-10-CM | POA: Insufficient documentation

## 2012-03-01 DIAGNOSIS — M545 Low back pain, unspecified: Secondary | ICD-10-CM | POA: Insufficient documentation

## 2012-03-01 DIAGNOSIS — T07XXXA Unspecified multiple injuries, initial encounter: Secondary | ICD-10-CM

## 2012-03-01 DIAGNOSIS — S81009A Unspecified open wound, unspecified knee, initial encounter: Secondary | ICD-10-CM | POA: Insufficient documentation

## 2012-03-01 DIAGNOSIS — M549 Dorsalgia, unspecified: Secondary | ICD-10-CM

## 2012-03-01 DIAGNOSIS — S41119A Laceration without foreign body of unspecified upper arm, initial encounter: Secondary | ICD-10-CM

## 2012-03-01 DIAGNOSIS — W010XXA Fall on same level from slipping, tripping and stumbling without subsequent striking against object, initial encounter: Secondary | ICD-10-CM | POA: Insufficient documentation

## 2012-03-01 DIAGNOSIS — S51009A Unspecified open wound of unspecified elbow, initial encounter: Secondary | ICD-10-CM | POA: Insufficient documentation

## 2012-03-01 MED ORDER — LIDOCAINE HCL (PF) 1 % IJ SOLN
INTRAMUSCULAR | Status: AC
  Filled 2012-03-01: qty 5

## 2012-03-01 MED ORDER — ACETAMINOPHEN 500 MG PO TABS: 1000.0000 mg | ORAL_TABLET | Freq: Once | ORAL | Status: DC

## 2012-03-01 MED ORDER — ACETAMINOPHEN 500 MG PO TABS
ORAL_TABLET | ORAL | Status: AC
  Filled 2012-03-01: qty 2

## 2012-03-01 NOTE — ED Provider Notes (Signed)
History     CSN: [id number]  Arrival date & time 03/01/12  2239   First MD Initiated Contact with Patient 03/01/12 2315      Chief Complaint  Patient presents with  . Fall  . Laceration    (Consider location/radiation/quality/duration/timing/severity/associated sxs/prior treatment) Patient is a 41 y.o. female presenting with fall and skin laceration. The history is provided by the patient.  Fall The accident occurred 1 to 2 hours ago. Incident: Pt was washing dishes when she fell on broken glass. She landed on a hard floor. Pain scale: back pain. The pain is severe. She was ambulatory at the scene. Associated symptoms include numbness and abdominal pain. Pertinent negatives include no bowel incontinence, no hematuria and no loss of consciousness. She has tried nothing (got up and called EMS) for the symptoms. The treatment provided no relief.  Laceration     Past Medical History  Diagnosis Date  . Bipolar 1 disorder   . PTSD (post-traumatic stress disorder)   . Fibromyalgia   . Mass of left breast   . Arthritis   . Coronary artery disease   . Myocardial infarct   . Tumors   . Stomach ulcer     Past Surgical History  Procedure Date  . Abdominal hysterectomy     History reviewed. No pertinent family history.  History  Substance Use Topics  . Smoking status: Current Everyday Smoker -- 1.0 packs/day    Types: Cigarettes  . Smokeless tobacco: Not on file  . Alcohol Use: No    OB History    Grav Para Term Preterm Abortions TAB SAB Ect Mult Living                  Review of Systems  Constitutional: Negative for activity change.       All ROS Neg except as noted in HPI  HENT: Negative for nosebleeds and neck pain.   Eyes: Negative for photophobia and discharge.  Respiratory: Negative for cough, shortness of breath and wheezing.   Cardiovascular: Positive for chest pain. Negative for palpitations.  Gastrointestinal: Positive for abdominal pain. Negative for blood  in stool and bowel incontinence.  Genitourinary: Negative for dysuria, frequency and hematuria.  Musculoskeletal: Positive for arthralgias. Negative for back pain.  Skin: Negative.   Neurological: Positive for numbness. Negative for dizziness, seizures, loss of consciousness and speech difficulty.  Psychiatric/Behavioral: Negative for hallucinations and confusion. The patient is nervous/anxious.     Allergies  Aspirin; Haldol; Nsaids; Sulfa antibiotics; and Ultram  Home Medications   Current Outpatient Rx  Name Route Sig Dispense Refill  . ALPRAZOLAM 1 MG PO TABS Oral Take 1 mg by mouth 3 (three) times daily as needed. anxiety    . VITAMIN B 12 PO Oral Take 1 tablet by mouth daily. Unknown strength.    . ESTRADIOL 0.5 MG PO TABS Oral Take 0.5 mg by mouth daily.     Marland Kitchen GABAPENTIN 600 MG PO TABS Oral Take 600 mg by mouth 4 (four) times daily.    Marland Kitchen GLUCOSAMINE PO Oral Take 1 tablet by mouth daily. Patient states strength is either 300mg  or 400mg  (not sure)    . LISINOPRIL 5 MG PO TABS Oral Take 5 mg by mouth daily.     Carma Leaven M PLUS PO TABS Oral Take 1 tablet by mouth daily.     . OXYCODONE HCL 10 MG PO TABS Oral Take 1 tablet (10 mg total) by mouth 3 (three) times daily as needed (  pain). 10 tablet 0    BP 103/63  Pulse 85  Temp 97.9 F (36.6 C) (Oral)  Resp 18  Ht 5' 3.5" (1.613 m)  Wt 106 lb (48.081 kg)  BMI 18.48 kg/m2  SpO2 98%  Physical Exam  Nursing note and vitals reviewed. Constitutional: She is oriented to person, place, and time. She appears well-developed and well-nourished.  Non-toxic appearance.  HENT:  Head: Normocephalic.  Right Ear: Tympanic membrane and external ear normal.  Left Ear: Tympanic membrane and external ear normal.  Eyes: EOM and lids are normal. Pupils are equal, round, and reactive to light.  Neck: Normal range of motion. Neck supple. Carotid bruit is not present.  Cardiovascular: Normal rate, regular rhythm, normal heart sounds, intact distal  pulses and normal pulses.   Pulmonary/Chest: Breath sounds normal. No respiratory distress.       No chest wall tenderness.  Abdominal: Soft. Bowel sounds are normal. There is no tenderness. There is no guarding.  Musculoskeletal: Normal range of motion.       There areshallow abrasions of the right hand.there are shallow abrasions from the mid calf to the heel on the left lower extremity. There are straight-line abrasions of the left forearm. There is a horizontal laceration to the antecubital area on the left .  There is full range of motion of the fingers wrist and elbow on the left. There is some bruising of the left wrist. There is pain to palpation of the lumbar area. No palpable step off appreciated. Some mild to moderate paraspinal tenderness.  Lymphadenopathy:       Head (right side): No submandibular adenopathy present.       Head (left side): No submandibular adenopathy present.    She has no cervical adenopathy.  Neurological: She is alert and oriented to person, place, and time. She has normal strength. No cranial nerve deficit or sensory deficit.  Skin: Skin is warm and dry.  Psychiatric: Her speech is normal. Her mood appears anxious.    ED Course  Procedures :LACERATION REPAIR - the patient identified by arm band. Procedural time out taken before laceration repair of the left antecubital area. The patient is a 2 cm laceration of the left antecubital area. The area was painted with Betadine. It was infiltrated with 1% plain lidocaine. Following this it was irrigated with tap water. The wound area was investigated, no foreign body appreciated. No major vascular or nerve involvement appreciated. The wound was repaired with 3 interrupted 4-0 Vicryl sutures. The patient has full range of motion of the fingers, wrist and elbow after the procedure. Dressing applied by nursing staff. Patient tolerated the procedure without problem or complication.  Labs Reviewed - No data to display No  results found.   No diagnosis found.    MDM  I have reviewed nursing notes, vital signs, and all appropriate lab and imaging results for this patient. The patient has multiple shallow abrasions of the lower extremity and upper extremity. There is a laceration of the left antecubital area. When asked the patient denies self-inflicted wounds. She denies suicidal ideation. She states she's having a great deal of lower back area pain. She has a history of low back area pain. She also has a history of narcotic abuse. Tylenol Extra Strength was offered to the patient for her discomfort. She became very upset that she was not offered narcotic pain medication. She requested to see a" real doctor". Case discussed with Dr. Manus Gunning, he evaluated the patient and  gave the patient injection of Toradol.  X-ray of the lumbar spine is negative for acute changes. There is no acute loss of vertebral body height or disc height.the x-ray of the left elbow shows no bony abnormality. There is no foreign body in the soft tissue.  When I went to the room to give the patient the results of her tests she is sleeping without any problem or complication whatsoever. Test results given to the patient. She is to follow with her primary physicians for evaluation.       Kathie Dike, [place] 03/19/12 1623

## 2012-03-01 NOTE — ED Notes (Signed)
Pt has several areas of abrasions; all wrapped in gauze and bleeding is controled; Pt states that she did hit the back of her head; no areas noted on head; pt is complaining of headache

## 2012-03-01 NOTE — ED Notes (Signed)
Pt to department via EMS. Pt reports she was doing dishes when she slipped in some water and fell on a broken glass on the floor.  Lacerations noted on lower extremities, wrapped in gauze dressings.

## 2012-03-01 NOTE — ED Notes (Signed)
Pt refuses Tylenol PA aware. Pt states "they will do me no good, I have them at home!!"

## 2012-03-02 MED ORDER — KETOROLAC TROMETHAMINE 60 MG/2ML IM SOLN
60.0000 mg | Freq: Once | INTRAMUSCULAR | Status: AC
  Administered 2012-03-02: 60 mg via INTRAMUSCULAR
  Filled 2012-03-02: qty 2

## 2012-03-02 NOTE — ED Notes (Addendum)
Pt needed to be woken up to be discharged.  Somewhat groggy and difficult to engage. Pt upset she was not given Dilaudid.  Discussed with pt why narcotic medication wasn't administered.  Pt did call family member for ride home.

## 2012-03-02 NOTE — ED Notes (Signed)
Pt yelling out Upset about pain medication; Tried to provide encouragement Pt screaming out; stating "Damn tylenol wont help anything" Informed pt that she had to stop yelling out and cussing in this department, Informed pt that security would be notified, she yelled "I dont give a damn!"  Tried to discuss plan of care with pt, pt not receptive at this time. Security notified.

## 2012-03-02 NOTE — ED Notes (Signed)
Pt ambulating to bathroom, Pt has a very unsteady gait. Required assistance PA aware

## 2012-03-02 NOTE — ED Notes (Signed)
Patient transported to X-ray 

## 2012-03-19 NOTE — ED Provider Notes (Signed)
Medical screening examination/treatment/procedure(s) were conducted as a shared visit with non-physician practitioner(s) and myself.  I personally evaluated the patient during the encounter  Multiple LE abrasions from alleged fall on broken glass.  Neurovascularly intact.   Glynn Octave, MD 03/19/12 1659

## 2012-03-20 ENCOUNTER — Emergency Department (HOSPITAL_COMMUNITY)
Admission: EM | Admit: 2012-03-20 | Discharge: 2012-03-20 | Disposition: A | Discharge status: Home / Self Care | Payer: Medicaid Other | Attending: Emergency Medicine | Admitting: Emergency Medicine

## 2012-03-20 ENCOUNTER — Encounter (HOSPITAL_COMMUNITY): Payer: Self-pay

## 2012-03-20 DIAGNOSIS — F319 Bipolar disorder, unspecified: Secondary | ICD-10-CM | POA: Insufficient documentation

## 2012-03-20 DIAGNOSIS — G8929 Other chronic pain: Secondary | ICD-10-CM | POA: Insufficient documentation

## 2012-03-20 DIAGNOSIS — I2510 Atherosclerotic heart disease of native coronary artery without angina pectoris: Secondary | ICD-10-CM | POA: Insufficient documentation

## 2012-03-20 DIAGNOSIS — R51 Headache: Secondary | ICD-10-CM | POA: Insufficient documentation

## 2012-03-20 DIAGNOSIS — F4310 Post-traumatic stress disorder, unspecified: Secondary | ICD-10-CM | POA: Insufficient documentation

## 2012-03-20 DIAGNOSIS — Z882 Allergy status to sulfonamides status: Secondary | ICD-10-CM | POA: Insufficient documentation

## 2012-03-20 DIAGNOSIS — Z888 Allergy status to other drugs, medicaments and biological substances status: Secondary | ICD-10-CM | POA: Insufficient documentation

## 2012-03-20 DIAGNOSIS — F17200 Nicotine dependence, unspecified, uncomplicated: Secondary | ICD-10-CM | POA: Insufficient documentation

## 2012-03-20 HISTORY — DX: Malignant neoplasm of unspecified part of unspecified bronchus or lung: C34.90

## 2012-03-20 MED ORDER — OXYCODONE-ACETAMINOPHEN 5-325 MG PO TABS: 1.0000 | ORAL_TABLET | Freq: Four times a day (QID) | ORAL | Status: AC | PRN

## 2012-03-20 MED ORDER — HYDROMORPHONE HCL PF 1 MG/ML IJ SOLN
1.0000 mg | Freq: Once | INTRAMUSCULAR | Status: AC
  Administered 2012-03-20: 1 mg via INTRAMUSCULAR
  Filled 2012-03-20: qty 1

## 2012-03-20 NOTE — ED Provider Notes (Addendum)
History   This chart was scribed for Donnetta Hutching, MD by Melba Coon. The patient was seen in room APA05/APA05 and the patient's care was started at 5:05PM.    CSN: [id number]  Arrival date & time 03/20/12  1554   First MD Initiated Contact with Patient 03/20/12 1619      Chief Complaint  Patient presents with  . Headache  . Generalized Body Aches  . Emesis  . Chest Pain    (Consider location/radiation/quality/duration/timing/severity/associated sxs/prior treatment) The history is provided by the patient. No language interpreter was used.   Danielle Swanson is a 41 y.o. female who presents to the Emergency Department complaining of constant, moderate to severe headache with associated emesis and generalized body aches with an onset this morning. Pt states that she 1 min of pressured chest pain last night, then woke up this morning with the present symptoms. Pt states that she wants a pain shot here and pain meds for home. Pt took hydrocodone last night and sates that her family members keep taking her pain meds. No fever, neck pain, sore throat, rash, back pain, SOB, abd pain, diarrhea, dysuria, or extremity pain, edema, weakness, numbness, or tingling. Pt goes to pain management in [place]. Pt states she has tumors in the lungs and is going to have a biopsy performed in the next month. No other pertinent medical symptoms.  PCP: Mrs. Karleen Hampshire, [place]; works for [employer] Surgeon: Dr.Carter in [place]   Past Medical History  Diagnosis Date  . Bipolar 1 disorder   . PTSD (post-traumatic stress disorder)   . Fibromyalgia   . Mass of left breast   . Arthritis   . Coronary artery disease   . Myocardial infarct   . Tumors   . Stomach ulcer   . Lung cancer     Past Surgical History  Procedure Date  . Abdominal hysterectomy     No family history on file.  History  Substance Use Topics  . Smoking status: Current Everyday Smoker -- 1.0 packs/day    Types: Cigarettes    . Smokeless tobacco: Not on file  . Alcohol Use: No    OB History    Grav Para Term Preterm Abortions TAB SAB Ect Mult Living                  Review of Systems 10 Systems reviewed and all are negative for acute change except as noted in the HPI.   Allergies  Aspirin; Haldol; Nsaids; Sulfa antibiotics; and Ultram  Home Medications   Current Outpatient Rx  Name Route Sig Dispense Refill  . ALPRAZOLAM 1 MG PO TABS Oral Take 1 mg by mouth 3 (three) times daily as needed. anxiety    . VITAMIN B 12 PO Oral Take 1 tablet by mouth daily. Unknown strength.    . ESTRADIOL 0.5 MG PO TABS Oral Take 0.5 mg by mouth daily.     Marland Kitchen GABAPENTIN 600 MG PO TABS Oral Take 600 mg by mouth 4 (four) times daily.    Marland Kitchen GLUCOSAMINE PO Oral Take 1 tablet by mouth daily. Patient states strength is either 300mg  or 400mg  (not sure)    . LISINOPRIL 5 MG PO TABS Oral Take 5 mg by mouth daily.     Carma Leaven M PLUS PO TABS Oral Take 1 tablet by mouth daily.     . OXYCODONE HCL 10 MG PO TABS Oral Take 1 tablet (10 mg total) by mouth 3 (three) times daily  as needed (pain). 10 tablet 0    BP 117/62  Pulse 84  Temp 97.9 F (36.6 C) (Oral)  Resp 18  Ht 5\' 3"  (1.6 m)  Wt 104 lb (47.174 kg)  BMI 18.42 kg/m2  SpO2 100%  Physical Exam  Nursing note and vitals reviewed. Constitutional: She is oriented to person, place, and time. She appears well-developed and well-nourished.  HENT:  Head: Normocephalic and atraumatic.  Eyes: Conjunctivae normal and EOM are normal. Pupils are equal, round, and reactive to light.  Neck: Normal range of motion. Neck supple.  Cardiovascular: Normal rate, regular rhythm and normal heart sounds.   Pulmonary/Chest: Effort normal and breath sounds normal.  Abdominal: Soft. Bowel sounds are normal.  Musculoskeletal: Normal range of motion.  Neurological: She is alert and oriented to person, place, and time.  Skin: Skin is warm and dry.  Psychiatric: She has a normal mood and affect.     ED Course  Procedures (including critical care time)  DIAGNOSTIC STUDIES: Oxygen Saturation is 100% on room air, normal by my interpretation.    COORDINATION OF CARE:  5:13PM - Pt will be given a pain shot and Rx pain meds for home. Pt ready for d/c.   Labs Reviewed - No data to display No results found.   No diagnosis found.   Date: 03/20/2012  Rate: 81  Rhythm: normal sinus rhythm  QRS Axis: normal  Intervals: normal  ST/T Wave abnormalities: normal  Conduction Disutrbances: none  Narrative Interpretation: unremarkable     MDM  Patient has chronic pain.  Chest pain last night was fleeting lasting 1 minute.  She was supposed to see her pain management doctor today but the appointment was moved to September 3.  No acute findings     I personally performed the services described in this documentation, which was scribed in my presence. The recorded information has been reviewed and considered.        Donnetta Hutching, MD 03/20/12 1733  Donnetta Hutching, MD 04/30/12 [phone]

## 2012-03-20 NOTE — ED Notes (Signed)
Pt reports had brief episode of chest pressure last night.  Says lasted .  This morning woke up with migraine headache, generalized body aches, and vomiting.  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

## 2012-04-10 IMAGING — CR DG CHEST 2V
2 series · 2 of 2 positions shown · non-contrast
Comparison: 08/03/2010 (portable chest)

CLINICAL DATA: Chest pain

CHEST - 2 VIEW

[view not recorded (1 of 2)]
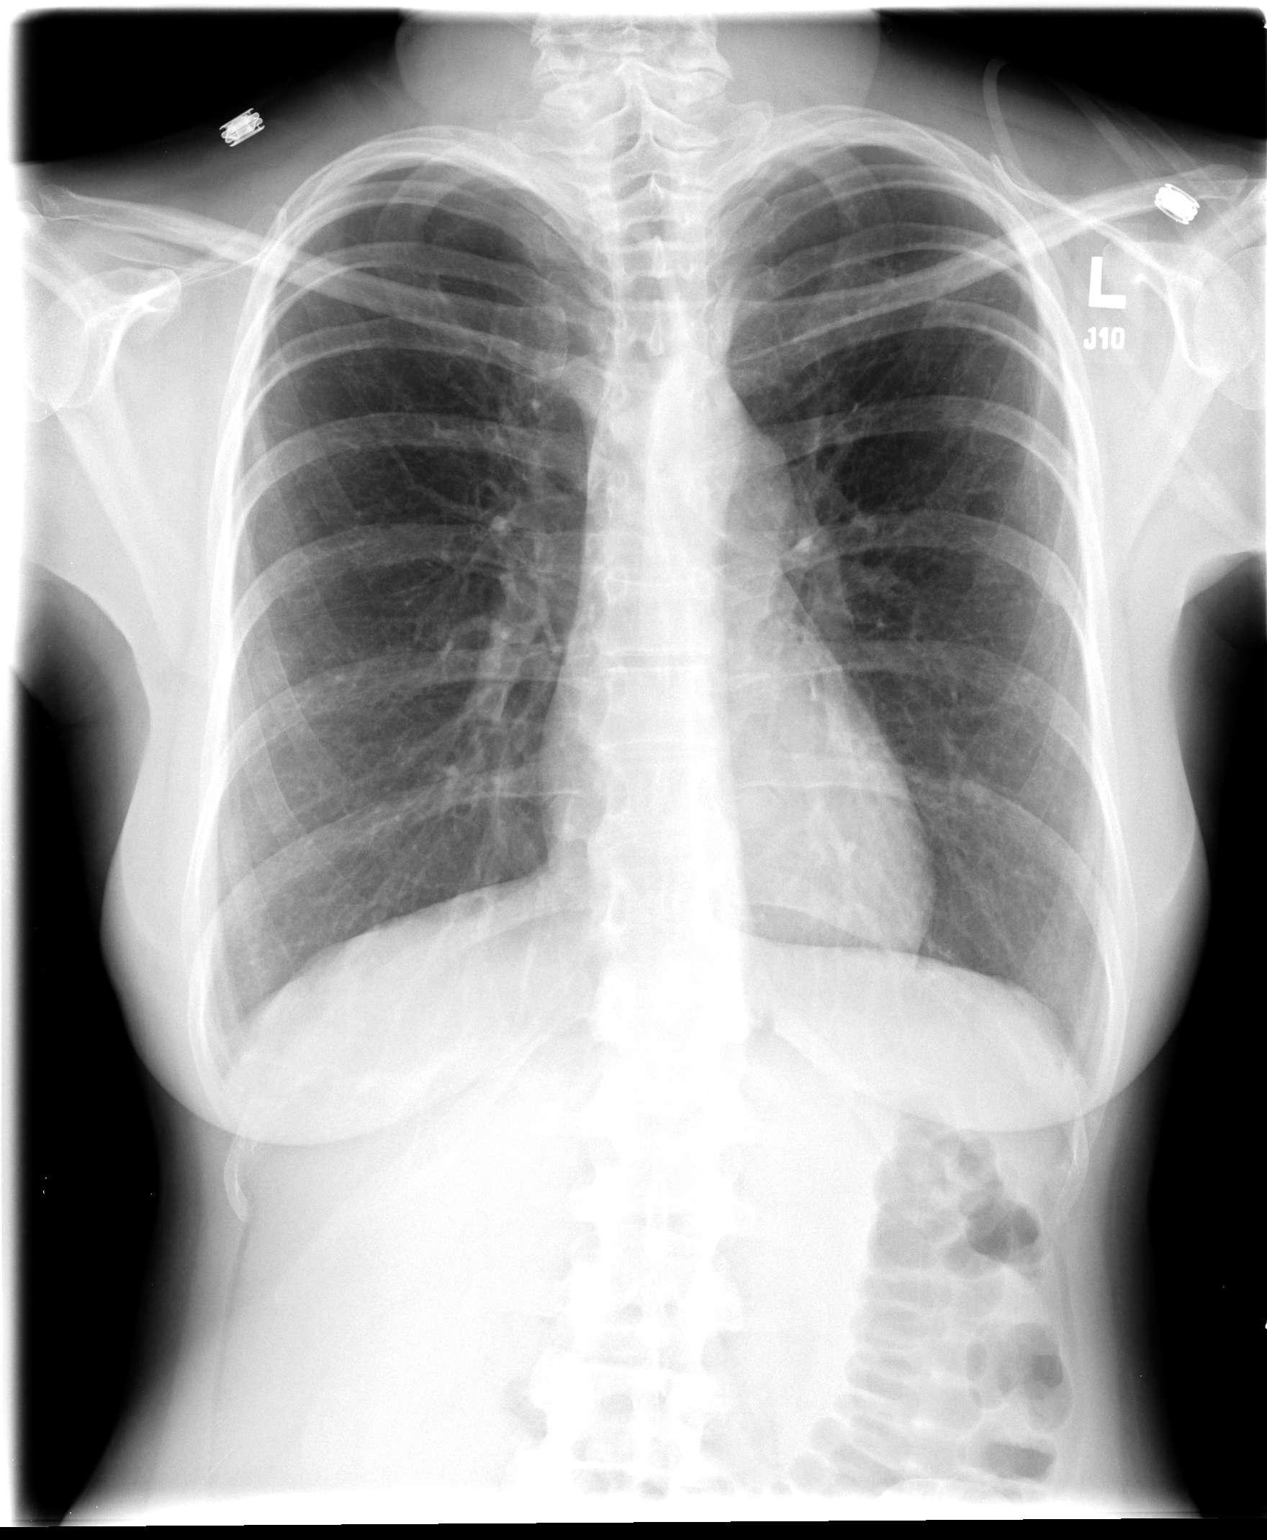

[view not recorded (2 of 2)]
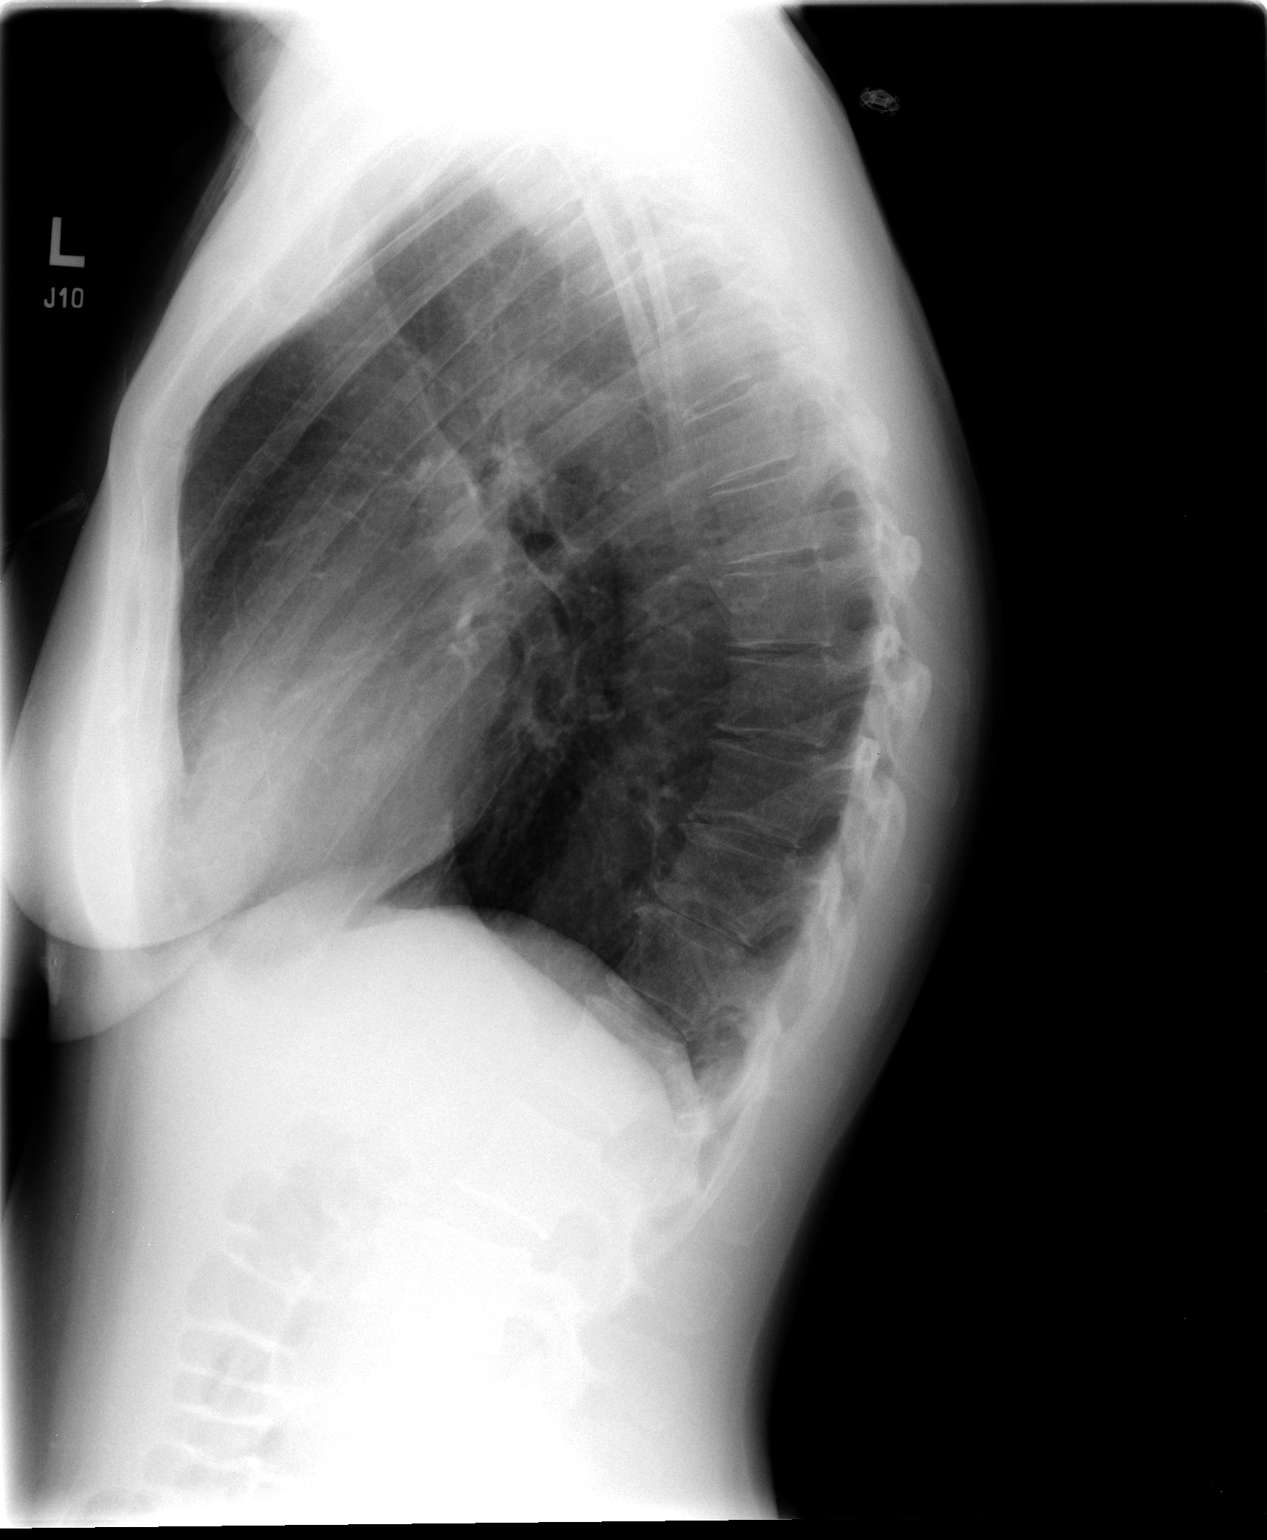

[2 of 2 positions shown; findings below may reference images not displayed]

FINDINGS: Possible mass in the region of the aortopulmonic window.
This could be a vascular shadow, but mediastinal adenopathy is
questioned.  Lungs clear except for question of several small
nodules in the left lower lung zone.  Given the history, and these
findings, consider CT of the chest with contrast for further
assessment.

No pleural fluid.  No osseous lesions.
IMPRESSION: 1.  Question several small lung nodules, and suspicious for left
mediastinal adenopathy.
2.  Consider CT chest with contrast for further assessment.  See
report.

## 2012-04-15 ENCOUNTER — Encounter (HOSPITAL_COMMUNITY): Payer: Self-pay | Admitting: Emergency Medicine

## 2012-04-15 ENCOUNTER — Emergency Department (HOSPITAL_COMMUNITY)
Admission: EM | Admit: 2012-04-15 | Discharge: 2012-04-15 | Disposition: A | Discharge status: Home / Self Care | Payer: Medicaid Other | Attending: Emergency Medicine | Admitting: Emergency Medicine

## 2012-04-15 DIAGNOSIS — Z888 Allergy status to other drugs, medicaments and biological substances status: Secondary | ICD-10-CM | POA: Insufficient documentation

## 2012-04-15 DIAGNOSIS — J00 Acute nasopharyngitis [common cold]: Secondary | ICD-10-CM

## 2012-04-15 DIAGNOSIS — F17200 Nicotine dependence, unspecified, uncomplicated: Secondary | ICD-10-CM | POA: Insufficient documentation

## 2012-04-15 DIAGNOSIS — Z760 Encounter for issue of repeat prescription: Secondary | ICD-10-CM | POA: Insufficient documentation

## 2012-04-15 DIAGNOSIS — I2510 Atherosclerotic heart disease of native coronary artery without angina pectoris: Secondary | ICD-10-CM | POA: Insufficient documentation

## 2012-04-15 DIAGNOSIS — F319 Bipolar disorder, unspecified: Secondary | ICD-10-CM | POA: Insufficient documentation

## 2012-04-15 DIAGNOSIS — Z882 Allergy status to sulfonamides status: Secondary | ICD-10-CM | POA: Insufficient documentation

## 2012-04-15 MED ORDER — DESLORATADINE 5 MG PO TABS: 5.0000 mg | ORAL_TABLET | Freq: Every day | ORAL | Status: AC

## 2012-04-15 MED ORDER — ESTRADIOL 0.5 MG PO TABS: 0.5000 mg | ORAL_TABLET | Freq: Every day | ORAL | Status: DC

## 2012-04-15 MED ORDER — GABAPENTIN 600 MG PO TABS: 600.0000 mg | ORAL_TABLET | Freq: Four times a day (QID) | ORAL | Status: DC

## 2012-04-15 MED ORDER — ALPRAZOLAM 0.5 MG PO TABS: 0.5000 mg | ORAL_TABLET | Freq: Every evening | ORAL | Status: DC | PRN

## 2012-04-15 NOTE — ED Notes (Signed)
Pt states thinks she has sinus infection. Out of regular meds. Pt states unable to see PCP until new medicaid card is received.

## 2012-04-19 NOTE — ED Provider Notes (Signed)
History     CSN: [id number]  Arrival date & time 04/15/12  8413   First MD Initiated Contact with Patient 04/15/12 1025      Chief Complaint  Patient presents with  . Facial Pain  . Headache  . Medication Refill    (Consider location/radiation/quality/duration/timing/severity/associated sxs/prior treatment) HPI Comments: Danielle Swanson presents with nasal congestion,  Facial and nose pressure,  Clear rhinorrhea and post nasal drip.  She has run out of her clarinex which she takes for chronic allergies last week and since has developed her presenting symptoms.  She denies fevers, chills, nausea, sore throat and cough.  She denies ear pain or dizziness.    Additionally,  She is out of several of her chronic medications,  Including xanax, estrace and neurontin and asks for refills until she can see her new pcp at [facility].  She has scheduled an appointment and is anticipating receiving her new medicaid cared prior to this visit.  She ran out of her medications within the past 2 weeks.    The history is provided by the patient.    Past Medical History  Diagnosis Date  . Bipolar 1 disorder   . PTSD (post-traumatic stress disorder)   . Fibromyalgia   . Mass of left breast   . Arthritis   . Coronary artery disease   . Myocardial infarct   . Tumors   . Stomach ulcer   . Lung cancer     Past Surgical History  Procedure Date  . Abdominal hysterectomy     History reviewed. No pertinent family history.  History  Substance Use Topics  . Smoking status: Current Every Day Smoker -- 1.0 packs/day    Types: Cigarettes  . Smokeless tobacco: Not on file  . Alcohol Use: No    OB History    Grav Para Term Preterm Abortions TAB SAB Ect Mult Living                  Review of Systems  Constitutional: Negative for fever and chills.  HENT: Positive for congestion, rhinorrhea and sinus pressure. Negative for ear pain, sore throat, trouble swallowing, neck pain and voice change.    Eyes: Negative.   Respiratory: Negative for chest tightness, shortness of breath and wheezing.   Cardiovascular: Negative for chest pain.  Gastrointestinal: Negative for nausea and abdominal pain.  Genitourinary: Negative.   Musculoskeletal: Negative for joint swelling and arthralgias.  Skin: Negative.  Negative for rash and wound.  Neurological: Negative for dizziness, weakness, light-headedness, numbness and headaches.  Hematological: Negative.   Psychiatric/Behavioral: Negative.  The patient is not nervous/anxious.     Allergies  Aspirin; Haldol; Nsaids; Sulfa antibiotics; and Ultram  Home Medications   Current Outpatient Rx  Name Route Sig Dispense Refill  . ALPRAZOLAM 0.5 MG PO TABS Oral Take 1 tablet (0.5 mg total) by mouth at bedtime as needed for anxiety. 10 tablet 0  . DESLORATADINE 5 MG PO TABS Oral Take 1 tablet (5 mg total) by mouth daily. 30 tablet 0  . ESTRADIOL 0.5 MG PO TABS Oral Take 0.5 mg by mouth daily.     Marland Kitchen ESTRADIOL 0.5 MG PO TABS Oral Take 1 tablet (0.5 mg total) by mouth daily. 30 tablet 0  . GABAPENTIN 600 MG PO TABS Oral Take 600 mg by mouth 4 (four) times daily.    Marland Kitchen GABAPENTIN 600 MG PO TABS Oral Take 1 tablet (600 mg total) by mouth 4 (four) times daily. 60  tablet 0  . GLUCOSAMINE PO Oral Take 1 tablet by mouth daily. Patient states strength is either 300mg  or 400mg  (not sure)    . LISINOPRIL 5 MG PO TABS Oral Take 5 mg by mouth daily.     Carma Leaven M PLUS PO TABS Oral Take 1 tablet by mouth daily.     . OXYCODONE HCL 10 MG PO TABS Oral Take 1 tablet (10 mg total) by mouth 3 (three) times daily as needed (pain). 10 tablet 0    BP 100/75  Pulse 79  Temp 98.3 F (36.8 C) (Oral)  Resp 18  Ht 5' 3.5" (1.613 m)  Wt 108 lb 5 oz (49.13 kg)  BMI 18.89 kg/m2  SpO2 100%  Physical Exam  Constitutional: She is oriented to person, place, and time. She appears well-developed and well-nourished.  HENT:  Head: Normocephalic and atraumatic.  Right Ear:  Tympanic membrane, external ear and ear canal normal.  Left Ear: Tympanic membrane, external ear and ear canal normal.  Nose: Mucosal edema and rhinorrhea present. Right sinus exhibits no maxillary sinus tenderness and no frontal sinus tenderness. Left sinus exhibits no maxillary sinus tenderness and no frontal sinus tenderness.  Mouth/Throat: Uvula is midline, oropharynx is clear and moist and mucous membranes are normal. No oropharyngeal exudate, posterior oropharyngeal edema, posterior oropharyngeal erythema or tonsillar abscesses.       Clear rhinorrhea   Eyes: Conjunctivae normal are normal.  Pulmonary/Chest: Effort normal. No respiratory distress. She has no wheezes. She has no rales.  Abdominal: Soft. There is no tenderness.  Musculoskeletal: Normal range of motion.  Neurological: She is alert and oriented to person, place, and time.  Skin: Skin is warm and dry. No rash noted.  Psychiatric: She has a normal mood and affect.    ED Course  Procedures (including critical care time)  Labs Reviewed - No data to display No results found.   1. Acute rhinitis   2. Medication refill       MDM  No evidence per history or exam of acute sinusitis.  Allergic rhinitis with recent loss of her clarinex.  Pt was prescribed clarinex along with her estrace and her gabapentin,  Short course of xanax for prn anxiety.  Encouraged f/u with new pcp asap.        Burgess Amor, [place] 04/19/12 2020

## 2012-04-19 NOTE — ED Provider Notes (Signed)
Medical screening examination/treatment/procedure(s) were performed by non-physician practitioner and as supervising physician I was immediately available for consultation/collaboration.   Hermela Hardt L Leilynn Pilat, MD 04/19/12 2250 

## 2012-05-29 ENCOUNTER — Other Ambulatory Visit (HOSPITAL_COMMUNITY): Payer: Self-pay | Admitting: Physician Assistant

## 2012-05-29 DIAGNOSIS — Z139 Encounter for screening, unspecified: Secondary | ICD-10-CM

## 2012-06-06 ENCOUNTER — Ambulatory Visit (HOSPITAL_COMMUNITY): Payer: Medicaid Other

## 2012-07-23 ENCOUNTER — Emergency Department (HOSPITAL_COMMUNITY)
Admission: EM | Admit: 2012-07-23 | Discharge: 2012-07-23 | Disposition: A | Discharge status: Home / Self Care | Payer: Medicaid Other | Attending: Emergency Medicine | Admitting: Emergency Medicine

## 2012-07-23 ENCOUNTER — Encounter (HOSPITAL_COMMUNITY): Payer: Self-pay | Admitting: *Deleted

## 2012-07-23 DIAGNOSIS — Z85118 Personal history of other malignant neoplasm of bronchus and lung: Secondary | ICD-10-CM | POA: Insufficient documentation

## 2012-07-23 DIAGNOSIS — I252 Old myocardial infarction: Secondary | ICD-10-CM | POA: Insufficient documentation

## 2012-07-23 DIAGNOSIS — M549 Dorsalgia, unspecified: Secondary | ICD-10-CM | POA: Insufficient documentation

## 2012-07-23 DIAGNOSIS — G8929 Other chronic pain: Secondary | ICD-10-CM | POA: Insufficient documentation

## 2012-07-23 DIAGNOSIS — F319 Bipolar disorder, unspecified: Secondary | ICD-10-CM | POA: Insufficient documentation

## 2012-07-23 DIAGNOSIS — F17200 Nicotine dependence, unspecified, uncomplicated: Secondary | ICD-10-CM | POA: Insufficient documentation

## 2012-07-23 DIAGNOSIS — Z8742 Personal history of other diseases of the female genital tract: Secondary | ICD-10-CM | POA: Insufficient documentation

## 2012-07-23 DIAGNOSIS — Z8719 Personal history of other diseases of the digestive system: Secondary | ICD-10-CM | POA: Insufficient documentation

## 2012-07-23 DIAGNOSIS — Z8739 Personal history of other diseases of the musculoskeletal system and connective tissue: Secondary | ICD-10-CM | POA: Insufficient documentation

## 2012-07-23 DIAGNOSIS — I2510 Atherosclerotic heart disease of native coronary artery without angina pectoris: Secondary | ICD-10-CM | POA: Insufficient documentation

## 2012-07-23 DIAGNOSIS — F4310 Post-traumatic stress disorder, unspecified: Secondary | ICD-10-CM | POA: Insufficient documentation

## 2012-07-23 DIAGNOSIS — Z79899 Other long term (current) drug therapy: Secondary | ICD-10-CM | POA: Insufficient documentation

## 2012-07-23 MED ORDER — OXYCODONE-ACETAMINOPHEN 5-325 MG PO TABS
1.0000 | ORAL_TABLET | Freq: Four times a day (QID) | ORAL | Status: DC | PRN
Start: 2012-07-23 — End: 2012-12-06

## 2012-07-23 MED ORDER — ALPRAZOLAM 0.5 MG PO TABS: 1.0000 mg | ORAL_TABLET | Freq: Three times a day (TID) | ORAL | Status: DC | PRN

## 2012-07-23 MED ORDER — OMEPRAZOLE 20 MG PO CPDR: 20.0000 mg | DELAYED_RELEASE_CAPSULE | Freq: Every day | ORAL | Status: DC

## 2012-07-23 MED ORDER — OXYCODONE-ACETAMINOPHEN 5-325 MG PO TABS
2.0000 | ORAL_TABLET | Freq: Once | ORAL | Status: AC
  Administered 2012-07-23: 2 via ORAL
  Filled 2012-07-23: qty 2
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
## 2012-07-28 ENCOUNTER — Encounter (HOSPITAL_COMMUNITY): Payer: Self-pay | Admitting: *Deleted

## 2012-07-28 ENCOUNTER — Emergency Department (HOSPITAL_COMMUNITY)
Admission: EM | Admit: 2012-07-28 | Discharge: 2012-07-28 | Disposition: A | Discharge status: Home / Self Care | Payer: Medicaid Other | Attending: Emergency Medicine | Admitting: Emergency Medicine

## 2012-07-28 DIAGNOSIS — F17200 Nicotine dependence, unspecified, uncomplicated: Secondary | ICD-10-CM | POA: Insufficient documentation

## 2012-07-28 DIAGNOSIS — F1123 Opioid dependence with withdrawal: Secondary | ICD-10-CM

## 2012-07-28 DIAGNOSIS — Z8711 Personal history of peptic ulcer disease: Secondary | ICD-10-CM | POA: Insufficient documentation

## 2012-07-28 DIAGNOSIS — F319 Bipolar disorder, unspecified: Secondary | ICD-10-CM | POA: Insufficient documentation

## 2012-07-28 DIAGNOSIS — R197 Diarrhea, unspecified: Secondary | ICD-10-CM | POA: Insufficient documentation

## 2012-07-28 DIAGNOSIS — F19939 Other psychoactive substance use, unspecified with withdrawal, unspecified: Secondary | ICD-10-CM | POA: Insufficient documentation

## 2012-07-28 DIAGNOSIS — I252 Old myocardial infarction: Secondary | ICD-10-CM | POA: Insufficient documentation

## 2012-07-28 DIAGNOSIS — R112 Nausea with vomiting, unspecified: Secondary | ICD-10-CM | POA: Insufficient documentation

## 2012-07-28 DIAGNOSIS — IMO0001 Reserved for inherently not codable concepts without codable children: Secondary | ICD-10-CM | POA: Insufficient documentation

## 2012-07-28 DIAGNOSIS — F4310 Post-traumatic stress disorder, unspecified: Secondary | ICD-10-CM | POA: Insufficient documentation

## 2012-07-28 DIAGNOSIS — Z8739 Personal history of other diseases of the musculoskeletal system and connective tissue: Secondary | ICD-10-CM | POA: Insufficient documentation

## 2012-07-28 DIAGNOSIS — Z85118 Personal history of other malignant neoplasm of bronchus and lung: Secondary | ICD-10-CM | POA: Insufficient documentation

## 2012-07-28 DIAGNOSIS — Z79899 Other long term (current) drug therapy: Secondary | ICD-10-CM | POA: Insufficient documentation

## 2012-07-28 DIAGNOSIS — R52 Pain, unspecified: Secondary | ICD-10-CM | POA: Insufficient documentation

## 2012-07-28 DIAGNOSIS — M549 Dorsalgia, unspecified: Secondary | ICD-10-CM | POA: Insufficient documentation

## 2012-07-28 DIAGNOSIS — I2510 Atherosclerotic heart disease of native coronary artery without angina pectoris: Secondary | ICD-10-CM | POA: Insufficient documentation

## 2012-07-28 DIAGNOSIS — Z8742 Personal history of other diseases of the female genital tract: Secondary | ICD-10-CM | POA: Insufficient documentation

## 2012-07-28 MED ORDER — LORAZEPAM 1 MG PO TABS
1.0000 mg | ORAL_TABLET | Freq: Once | ORAL | Status: AC
  Administered 2012-07-28: 1 mg via ORAL
  Filled 2012-07-28: qty 1

## 2012-07-28 MED ORDER — CLONIDINE HCL 0.1 MG PO TABS
0.1000 mg | ORAL_TABLET | Freq: Once | ORAL | Status: DC
  Filled 2012-07-28: qty 1
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
## 2012-07-28 NOTE — ED Notes (Signed)
Pt left, angry "how is a terminally ill person supposed to see their doctor?" left with rapidly ambulatory w/o signing for discharge instructions.

## 2012-07-28 NOTE — ED Notes (Signed)
Charge nurse Jeannette How and Fish farm manager with pt

## 2012-07-28 NOTE — Discharge Instructions (Signed)
Narcotic Withdrawal °When drug use interferes with normal living activities and relationships, it is abuse. Abuse includes problems with family and friends. Psychological dependence has developed when your mind tells you that the drug is needed. This is usually followed by a physical dependence in which you need more of the drug to get the same feeling or "high." This is known as addiction or chemical dependency. Risk is greater when chemical dependency exists in the family. °SYMPTOMS  °When tolerance to narcotics has developed, stopping of the narcotic suddenly can cause uncomfortable physical symptoms. Most of the time these are mild and consist of shakes or jitters (tremors) in the hands,a rapid heart rate, rapid breathing, and temperature. Sometimes these symptoms are associated with anxiety, panic attacks, and bad dreams. Other symptoms include: °· Irritability. °· Anxiety. °· Runny nose. °· "Goose flesh." °· Diarrhea. °· Feeling sick to the stomach (nauseous). °· Muscle spasms. °· Sleeplessness. °· Chills. °· Sweats. °· Drug cravings. °· Confusion. °The severity of the withdrawal is based on the individual and varies from person to person. Many people choose to continue using narcotics to get rid of the discomfort of withdrawal. They also use to try to feel normal. °TREATMENT  °Quitting an addiction means stopping use of all chemicals. This is hard but may save your life. With continual drug use, possible outcomes are often loss of self respect and esteem, violence, death, and eventually prison if the use of narcotics has led to the death of another. °Addiction cannot be cured, but it can be stopped. This often requires outside help and the care of professionals. Most hospitals and clinics can refer you to a specialized care center. °It is not necessary for you to go through the uncomfortable symptoms of withdrawal. Your caregiver can provide you with medications that will help you through this difficult  period. Try to avoid situations, friends, or alcohol, which may have made it possible for you to continue using narcotics in the past. Learn how to say no! °HOME CARE INSTRUCTIONS  °· Drink fluids, get plenty of rest, and take hot baths. °· Medicines may be prescribed to help control withdrawal symptoms. °· Over-the-counter medicines may be helpful to control diarrhea or an upset stomach. °· If your problems resulted from taking prescription pain medicines, make sure you have a follow-up visit with your caregiver within the next few days. Be open about this problem. °· If you are dependent or addicted to street drugs, contact a local drug and alcohol treatment center or Narcotics Anonymous. °· Have someone with you to monitor your symptoms. °· Engage in healthy activities with friends who do not use drugs. °· Stay away from the drug scene. °It takes a long period of time to overcome addictions to all drugs. There may be times when you feel as though you want to use. Following loss of a physical addiction and going through withdrawal, you have conquered the most difficult part of getting rid of an addiction. Gradually, you will have a lessening of the craving that is telling you that you need narcotics to feel normal. Call your caregiver or a member of your support group if more support is needed. Learn who to talk to in your family and among your friends so that during these periods you can receive outside help. °SEEK IMMEDIATE MEDICAL CARE IF:  °· You have vomiting that cannot be controlled, especially if you cannot keep liquids down. °· You are seeing things or hearing voices that are not really   MEDICAL CARE IF:    You have vomiting that cannot be controlled, especially if you cannot keep liquids down.   You are seeing things or hearing voices that are not really there (hallucinating).   You have a seizure.  Document Released: 09/30/2002 Document Revised: 10/02/2011 Document Reviewed: 07/05/2008  [employer] Patient Information 2013 [employer].

## 2012-11-06 ENCOUNTER — Other Ambulatory Visit (HOSPITAL_COMMUNITY): Payer: Self-pay | Admitting: Physician Assistant

## 2012-11-06 DIAGNOSIS — Z139 Encounter for screening, unspecified: Secondary | ICD-10-CM

## 2012-11-11 ENCOUNTER — Ambulatory Visit (HOSPITAL_COMMUNITY): Payer: Medicaid Other

## 2012-11-15 ENCOUNTER — Ambulatory Visit (HOSPITAL_COMMUNITY): Payer: Medicaid Other

## 2012-12-06 ENCOUNTER — Emergency Department (HOSPITAL_COMMUNITY)
Admission: EM | Admit: 2012-12-06 | Discharge: 2012-12-06 | Disposition: A | Discharge status: Home / Self Care | Payer: Medicaid Other | Attending: Emergency Medicine | Admitting: Emergency Medicine

## 2012-12-06 ENCOUNTER — Encounter (HOSPITAL_COMMUNITY): Payer: Self-pay | Admitting: *Deleted

## 2012-12-06 DIAGNOSIS — Z8739 Personal history of other diseases of the musculoskeletal system and connective tissue: Secondary | ICD-10-CM | POA: Insufficient documentation

## 2012-12-06 DIAGNOSIS — M129 Arthropathy, unspecified: Secondary | ICD-10-CM | POA: Insufficient documentation

## 2012-12-06 DIAGNOSIS — R110 Nausea: Secondary | ICD-10-CM | POA: Insufficient documentation

## 2012-12-06 DIAGNOSIS — Z8659 Personal history of other mental and behavioral disorders: Secondary | ICD-10-CM | POA: Insufficient documentation

## 2012-12-06 DIAGNOSIS — K259 Gastric ulcer, unspecified as acute or chronic, without hemorrhage or perforation: Secondary | ICD-10-CM | POA: Insufficient documentation

## 2012-12-06 DIAGNOSIS — R51 Headache: Secondary | ICD-10-CM | POA: Insufficient documentation

## 2012-12-06 DIAGNOSIS — Z79899 Other long term (current) drug therapy: Secondary | ICD-10-CM | POA: Insufficient documentation

## 2012-12-06 DIAGNOSIS — H53149 Visual discomfort, unspecified: Secondary | ICD-10-CM | POA: Insufficient documentation

## 2012-12-06 DIAGNOSIS — F17200 Nicotine dependence, unspecified, uncomplicated: Secondary | ICD-10-CM | POA: Insufficient documentation

## 2012-12-06 DIAGNOSIS — C3490 Malignant neoplasm of unspecified part of unspecified bronchus or lung: Secondary | ICD-10-CM | POA: Insufficient documentation

## 2012-12-06 DIAGNOSIS — I252 Old myocardial infarction: Secondary | ICD-10-CM | POA: Insufficient documentation

## 2012-12-06 DIAGNOSIS — F319 Bipolar disorder, unspecified: Secondary | ICD-10-CM | POA: Insufficient documentation

## 2012-12-06 DIAGNOSIS — I2510 Atherosclerotic heart disease of native coronary artery without angina pectoris: Secondary | ICD-10-CM | POA: Insufficient documentation

## 2012-12-06 DIAGNOSIS — Z8742 Personal history of other diseases of the female genital tract: Secondary | ICD-10-CM | POA: Insufficient documentation

## 2012-12-06 DIAGNOSIS — C787 Secondary malignant neoplasm of liver and intrahepatic bile duct: Secondary | ICD-10-CM | POA: Insufficient documentation

## 2012-12-06 MED ORDER — DIPHENHYDRAMINE HCL 50 MG/ML IJ SOLN
12.5000 mg | Freq: Once | INTRAMUSCULAR | Status: AC
  Administered 2012-12-06: 12.5 mg via INTRAMUSCULAR
  Filled 2012-12-06: qty 1

## 2012-12-06 MED ORDER — SODIUM CHLORIDE 0.9 % IV BOLUS (SEPSIS): 1000.0000 mL | Freq: Once | INTRAVENOUS | Status: DC

## 2012-12-06 MED ORDER — DIPHENHYDRAMINE HCL 50 MG/ML IJ SOLN: 12.5000 mg | Freq: Once | INTRAMUSCULAR | Status: DC

## 2012-12-06 MED ORDER — METOCLOPRAMIDE HCL 5 MG/ML IJ SOLN
10.0000 mg | Freq: Once | INTRAMUSCULAR | Status: AC
  Administered 2012-12-06: 10 mg via INTRAMUSCULAR
  Filled 2012-12-06: qty 2

## 2012-12-06 MED ORDER — METOCLOPRAMIDE HCL 5 MG/ML IJ SOLN: 10.0000 mg | Freq: Once | INTRAMUSCULAR | Status: DC

## 2012-12-06 MED ORDER — DEXAMETHASONE SODIUM PHOSPHATE 4 MG/ML IJ SOLN: 10.0000 mg | Freq: Once | INTRAMUSCULAR | Status: DC

## 2012-12-06 MED ORDER — DEXAMETHASONE SODIUM PHOSPHATE 4 MG/ML IJ SOLN
10.0000 mg | Freq: Once | INTRAMUSCULAR | Status: AC
  Administered 2012-12-06: 10 mg via INTRAMUSCULAR
  Filled 2012-12-06: qty 3

## 2012-12-06 NOTE — ED Provider Notes (Signed)
History     CSN: [id number]  Arrival date & time 12/06/12  2005   First MD Initiated Contact with Patient 12/06/12 2024      Chief Complaint  Patient presents with  . Headache    (Consider location/radiation/quality/duration/timing/severity/associated sxs/prior treatment) Patient is a 42 y.o. female presenting with headaches.  Headache  Pt with history of migraines reports 3 hours of severe diffuse headache, associated with nausea and photophobia, similar to previous. She also reports history of lung cancer, untreated with mets to liver and 'a shadow on my brain' based on CT done in [place] at some point in the past. She states she is going to get treatment for these problems soon. Denies any blurry vision, numbness or weakness in extremities.   Past Medical History  Diagnosis Date  . Bipolar 1 disorder   . PTSD (post-traumatic stress disorder)   . Fibromyalgia   . Mass of left breast   . Arthritis   . Coronary artery disease   . Myocardial infarct   . Tumors   . Stomach ulcer   . Lung cancer     Past Surgical History  Procedure Laterality Date  . Abdominal hysterectomy      History reviewed. No pertinent family history.  History  Substance Use Topics  . Smoking status: Current Every Day Smoker -- 1.00 packs/day    Types: Cigarettes  . Smokeless tobacco: Not on file  . Alcohol Use: No    OB History   Grav Para Term Preterm Abortions TAB SAB Ect Mult Living                  Review of Systems  Neurological: Positive for headaches.   All other systems reviewed and are negative except as noted in HPI.   Allergies  Aspirin; Shellfish allergy; Haldol; Imitrex; Nsaids; Sulfa antibiotics; Tylenol; and Ultram  Home Medications   Current Outpatient Rx  Name  Route  Sig  Dispense  Refill  . ALPRAZolam (XANAX) 0.5 MG tablet   Oral   Take 2 tablets (1 mg total) by mouth 3 (three) times daily as needed for anxiety.   10 tablet   0   . ALPRAZolam (XANAX) 1  MG tablet   Oral   Take 1 mg by mouth 3 (three) times daily as needed. For anxiety         . desloratadine (CLARINEX) 5 MG tablet   Oral   Take 1 tablet (5 mg total) by mouth daily.   30 tablet   0   . estradiol (ESTRACE) 0.5 MG tablet   Oral   Take 0.5 mg by mouth daily.          . fexofenadine (ALLEGRA) 180 MG tablet   Oral   Take 180 mg by mouth daily.         . fish oil-omega-3 fatty acids 1000 MG capsule   Oral   Take 1 g by mouth daily.         Marland Kitchen gabapentin (NEURONTIN) 600 MG tablet   Oral   Take 600 mg by mouth 4 (four) times daily.         Marland Kitchen lisinopril (PRINIVIL,ZESTRIL) 10 MG tablet   Oral   Take 10 mg by mouth daily.         . meperidine (DEMEROL) 50 MG tablet   Oral   Take 50 mg by mouth every 4 (four) hours as needed. For pain         .  Multiple Vitamins-Minerals (MULTIVITAMINS THER. W/MINERALS) TABS   Oral   Take 1 tablet by mouth daily.          Marland Kitchen omeprazole (PRILOSEC) 20 MG capsule   Oral   Take 1 capsule (20 mg total) by mouth daily.   30 capsule   0   . omeprazole (PRILOSEC) 20 MG capsule   Oral   Take 20 mg by mouth daily.         . Oxycodone HCl 20 MG TABS   Oral   Take 20 mg by mouth 4 (four) times daily as needed. For pain         . oxyCODONE-acetaminophen (PERCOCET/ROXICET) 5-325 MG per tablet   Oral   Take 1-2 tablets by mouth every 6 (six) hours as needed for pain.   15 tablet   0     BP 102/58  Pulse 91  Temp(Src) 98 F (36.7 C) (Oral)  Resp 18  Ht 5\' 3"  (1.6 m)  Wt 123 lb (55.792 kg)  BMI 21.79 kg/m2  SpO2 95%  Physical Exam  Nursing note and vitals reviewed. Constitutional: She is oriented to person, place, and time. She appears well-developed and well-nourished.  HENT:  Head: Normocephalic and atraumatic.  Eyes: EOM are normal. Pupils are equal, round, and reactive to light.  Neck: Normal range of motion. Neck supple.  Cardiovascular: Normal rate, normal heart sounds and intact distal pulses.    Pulmonary/Chest: Effort normal and breath sounds normal.  Abdominal: Bowel sounds are normal. She exhibits no distension. There is no tenderness.  Musculoskeletal: Normal range of motion. She exhibits no edema and no tenderness.  Neurological: She is alert and oriented to person, place, and time. She has normal strength. No cranial nerve deficit or sensory deficit.  Skin: Skin is warm and dry. No rash noted.  Psychiatric: She has a normal mood and affect.    ED Course  Procedures (including critical care time)  Labs Reviewed - No data to display No results found.   1. Headache       MDM  Review of the medical record reveals previous CTs showing lung nodules, but these were unchanged over at least a year and negative on PET scan. She also had negative CT head less than a year ago with no signs of tumor. She is asking for refills on several of medications including demerol, dilaudid and lyrica. Advised that her PCP would have to see her for refills. Offered IV medications for her headache (non-narcotic) and IVF but she refused IV, asked for IM meds and to be discharged.         Charles B. Bernette Mayers, MD 12/06/12 2051

## 2012-12-06 NOTE — ED Notes (Addendum)
Headache, nausea , no vomiting..  Alert,   Pt says she has had lung cancer for 3 years and now is thinking about getting treatment. Says she also has a breast mass.  Abd swelling. Per pt

## 2013-01-15 ENCOUNTER — Emergency Department (HOSPITAL_COMMUNITY)
Admission: EM | Admit: 2013-01-15 | Discharge: 2013-01-15 | Discharge status: Against Medical Advice | Payer: Medicaid Other | Attending: Emergency Medicine | Admitting: Emergency Medicine

## 2013-01-15 ENCOUNTER — Encounter (HOSPITAL_COMMUNITY): Payer: Self-pay | Admitting: Emergency Medicine

## 2013-01-15 DIAGNOSIS — Z8659 Personal history of other mental and behavioral disorders: Secondary | ICD-10-CM | POA: Insufficient documentation

## 2013-01-15 DIAGNOSIS — I2510 Atherosclerotic heart disease of native coronary artery without angina pectoris: Secondary | ICD-10-CM | POA: Insufficient documentation

## 2013-01-15 DIAGNOSIS — G43909 Migraine, unspecified, not intractable, without status migrainosus: Secondary | ICD-10-CM | POA: Insufficient documentation

## 2013-01-15 DIAGNOSIS — Z79899 Other long term (current) drug therapy: Secondary | ICD-10-CM | POA: Insufficient documentation

## 2013-01-15 DIAGNOSIS — Z8739 Personal history of other diseases of the musculoskeletal system and connective tissue: Secondary | ICD-10-CM | POA: Insufficient documentation

## 2013-01-15 DIAGNOSIS — I252 Old myocardial infarction: Secondary | ICD-10-CM | POA: Insufficient documentation

## 2013-01-15 DIAGNOSIS — R110 Nausea: Secondary | ICD-10-CM | POA: Insufficient documentation

## 2013-01-15 DIAGNOSIS — Z85118 Personal history of other malignant neoplasm of bronchus and lung: Secondary | ICD-10-CM | POA: Insufficient documentation

## 2013-01-15 DIAGNOSIS — F319 Bipolar disorder, unspecified: Secondary | ICD-10-CM | POA: Insufficient documentation

## 2013-01-15 DIAGNOSIS — Z87891 Personal history of nicotine dependence: Secondary | ICD-10-CM | POA: Insufficient documentation

## 2013-01-15 DIAGNOSIS — M129 Arthropathy, unspecified: Secondary | ICD-10-CM | POA: Insufficient documentation

## 2013-01-15 DIAGNOSIS — K259 Gastric ulcer, unspecified as acute or chronic, without hemorrhage or perforation: Secondary | ICD-10-CM | POA: Insufficient documentation

## 2013-01-15 DIAGNOSIS — Z87898 Personal history of other specified conditions: Secondary | ICD-10-CM | POA: Insufficient documentation

## 2013-01-15 DIAGNOSIS — Z90710 Acquired absence of both cervix and uterus: Secondary | ICD-10-CM | POA: Insufficient documentation

## 2013-01-15 DIAGNOSIS — Z8742 Personal history of other diseases of the female genital tract: Secondary | ICD-10-CM | POA: Insufficient documentation

## 2013-01-15 MED ORDER — DEXAMETHASONE SODIUM PHOSPHATE 4 MG/ML IJ SOLN: 10.0000 mg | Freq: Once | INTRAMUSCULAR | Status: DC

## 2013-01-15 MED ORDER — METOCLOPRAMIDE HCL 5 MG/ML IJ SOLN: 10.0000 mg | Freq: Once | INTRAMUSCULAR | Status: DC

## 2013-01-15 MED ORDER — SODIUM CHLORIDE 0.9 % IV SOLN: Freq: Once | INTRAVENOUS | Status: DC

## 2013-01-15 MED ORDER — DIPHENHYDRAMINE HCL 50 MG/ML IJ SOLN: 25.0000 mg | Freq: Once | INTRAMUSCULAR | Status: DC

## 2013-01-15 NOTE — ED Notes (Signed)
Refused to have an IV started because of the meds for pain ordered, states the meds ordered do not help. Requested something different, edp notified and no meds ordered, patient left without notifying staff of decision to leave

## 2013-01-15 NOTE — ED Notes (Signed)
Patient c/o headache that started 3 hours ago. Per patient progressively getting worse. Patient states "It's turning into a migraine." Patient reports hx of migraines. Reports nausea.

## 2013-01-15 NOTE — ED Provider Notes (Signed)
History    This chart was scribed for Osvaldo Human, MD by Quintella Reichert, ED scribe.  This patient was seen in room APA17/APA17 and the patient's care was started at 6:12 PM.   CSN: [id number]  Arrival date & time 01/15/13  1739    Chief Complaint  Patient presents with  . Migraine    Patient is a 42 y.o. female presenting with migraines. The history is provided by the patient. No language interpreter was used.  Migraine This is a recurrent problem. The current episode started 3 to 5 hours ago. The problem occurs constantly. The problem has been gradually worsening. Associated symptoms include headaches. Pertinent negatives include no chest pain, no abdominal pain and no shortness of breath. Associated symptoms comments: Nausea, photophobia.. Nothing aggravates the symptoms. Nothing relieves the symptoms. She has tried nothing for the symptoms.    HPI Comments: Danielle Swanson is a 42 y.o. female with h/o migraines who presents to the Emergency Department complaining of constant, severe, progressively-worsening, diffuse headache that began 4 hours ago, with accompanying nausea and photophobia.  Pt states that she gets migraines 1x/month and her present symptoms feel the same.  She goes to a pain management clinic and reports that her next appointment is in two days  She notes that the last time she came to the [facility] ED she was given a Benadryl injection that did not relieve symptoms.  She states she went to [facility] the following day and was given IV dilaudid and phenergan, which did provide relief.  Additional medical history includes arthritis, Bipolar 1, fibromyalgia, MI, and lung cancer.  Surgical history includes hysterectomy.  She also notes that she has been out of Xanax for 2 months because she has had no transportation to [employer] is a former every-day smoker and states she quit 6 weeks ago.  She denies drug or alcohol use.   Past Medical History  Diagnosis  Date  . Bipolar 1 disorder   . PTSD (post-traumatic stress disorder)   . Fibromyalgia   . Mass of left breast   . Arthritis   . Coronary artery disease   . Myocardial infarct   . Tumors   . Stomach ulcer   . Lung cancer    Past Surgical History  Procedure Laterality Date  . Abdominal hysterectomy     Family History  Problem Relation Age of Onset  . Cancer Other   . Diabetes Other    History  Substance Use Topics  . Smoking status: Current Every Day Smoker -- 1.00 packs/day    Types: Cigarettes  . Smokeless tobacco: Not on file  . Alcohol Use: No   OB History   Grav Para Term Preterm Abortions TAB SAB Ect Mult Living   2 1 1  1  1   1      Review of Systems  Respiratory: Negative for shortness of breath.   Cardiovascular: Negative for chest pain.  Gastrointestinal: Negative for abdominal pain.  Neurological: Positive for headaches.  All other systems reviewed and are negative.    Allergies  Aspirin; Shellfish allergy; Haldol; Imitrex; Nsaids; Sulfa antibiotics; Toradol; Tylenol; and Ultram  Home Medications   Current Outpatient Rx  Name  Route  Sig  Dispense  Refill  . acetaminophen (TYLENOL) 500 MG tablet   Oral   Take 500-1,000 mg by mouth every 6 (six) hours as needed for pain.         Marland Kitchen ALPRAZolam (XANAX) 0.5  MG tablet   Oral   Take 2 tablets (1 mg total) by mouth 3 (three) times daily as needed for anxiety.   10 tablet   0   . desloratadine (CLARINEX) 5 MG tablet   Oral   Take 1 tablet (5 mg total) by mouth daily.   30 tablet   0   . estradiol (ESTRACE) 0.5 MG tablet   Oral   Take 0.5 mg by mouth daily.          Marland Kitchen gabapentin (NEURONTIN) 300 MG capsule   Oral   Take 300 mg by mouth 3 (three) times daily.         Marland Kitchen lisinopril (PRINIVIL,ZESTRIL) 5 MG tablet   Oral   Take 5 mg by mouth daily.         Marland Kitchen omeprazole (PRILOSEC) 20 MG capsule   Oral   Take 20 mg by mouth daily.         . Oxycodone HCl 20 MG TABS   Oral   Take 20  mg by mouth 4 (four) times daily as needed. For pain         . traZODone (DESYREL) 50 MG tablet   Oral   Take 50 mg by mouth at bedtime as needed for sleep.          BP 100/68  Pulse 112  Temp(Src) 98.6 F (37 C) (Oral)  Resp 18  Ht 5\' 4"  (1.626 m)  Wt 126 lb (57.153 kg)  BMI 21.62 kg/m2  SpO2 99%  Physical Exam  Nursing note and vitals reviewed. Constitutional: She is oriented to person, place, and time. She appears well-developed and well-nourished. No distress.  HENT:  Head: Normocephalic and atraumatic.  Right Ear: External ear normal.  Left Ear: External ear normal.  Nose: Nose normal.  Mouth/Throat: Oropharynx is clear and moist. No oropharyngeal exudate.  Eyes: Conjunctivae and EOM are normal. Pupils are equal, round, and reactive to light.  Neck: Normal range of motion. Neck supple. No tracheal deviation present.  Cardiovascular: Normal rate, regular rhythm and normal heart sounds.   No murmur heard. Pulmonary/Chest: Effort normal and breath sounds normal. No respiratory distress. She has no wheezes. She has no rales.  Abdominal: Soft. There is no tenderness.  Musculoskeletal: Normal range of motion.  Neurological: She is alert and oriented to person, place, and time. Coordination normal.  Neurologically intact  Skin: Skin is warm and dry.  Psychiatric: She has a normal mood and affect. Her behavior is normal.    ED Course  Procedures (including critical care time)  DIAGNOSTIC STUDIES: Oxygen Saturation is 99% on room air, normal by my interpretation.    COORDINATION OF CARE: 6:17 PM-Informed pt that no narcotic medications will be administered or refilled in ED and advised f/u with pain management specialist.  Discussed treatment plan which includes pain medication with pt at bedside and pt agreed to plan.    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  Review of the [place] controlled substance database showed  that she is in pain management with Dr. Nilsa Nutting, and receives monthly prescriptions of narcotics from him.  Pt asked me for injection of Dilaudid by name, and asked the nurse for the same thing.  This seemed to me like drug seeking behavior.  I had advised her that we would treat her headache with non-narcotic medication for headache.  When it was clear to her that she would not be given  IM Dilaudid and would not receive a prescription for Xanax she left AMA.        1. Left against medical advice   2. Headache   3. History of narcotic use      I personally performed the services described in this documentation, which was scribed in my presence. The recorded information has been reviewed and is accurate.  Osvaldo Human, M.D.         Carleene Cooper III, MD 01/16/13 [phone]

## 2013-02-03 ENCOUNTER — Encounter (HOSPITAL_COMMUNITY): Payer: Self-pay | Admitting: Emergency Medicine

## 2013-02-03 ENCOUNTER — Emergency Department (HOSPITAL_COMMUNITY)
Admission: EM | Admit: 2013-02-03 | Discharge: 2013-02-03 | Disposition: A | Discharge status: Home / Self Care | Payer: Medicaid Other | Attending: Emergency Medicine | Admitting: Emergency Medicine

## 2013-02-03 DIAGNOSIS — Z85118 Personal history of other malignant neoplasm of bronchus and lung: Secondary | ICD-10-CM | POA: Insufficient documentation

## 2013-02-03 DIAGNOSIS — Z8739 Personal history of other diseases of the musculoskeletal system and connective tissue: Secondary | ICD-10-CM | POA: Insufficient documentation

## 2013-02-03 DIAGNOSIS — I252 Old myocardial infarction: Secondary | ICD-10-CM | POA: Insufficient documentation

## 2013-02-03 DIAGNOSIS — M545 Low back pain, unspecified: Secondary | ICD-10-CM | POA: Insufficient documentation

## 2013-02-03 DIAGNOSIS — F4310 Post-traumatic stress disorder, unspecified: Secondary | ICD-10-CM | POA: Insufficient documentation

## 2013-02-03 DIAGNOSIS — Z79899 Other long term (current) drug therapy: Secondary | ICD-10-CM | POA: Insufficient documentation

## 2013-02-03 DIAGNOSIS — K259 Gastric ulcer, unspecified as acute or chronic, without hemorrhage or perforation: Secondary | ICD-10-CM | POA: Insufficient documentation

## 2013-02-03 DIAGNOSIS — F17200 Nicotine dependence, unspecified, uncomplicated: Secondary | ICD-10-CM | POA: Insufficient documentation

## 2013-02-03 DIAGNOSIS — Z760 Encounter for issue of repeat prescription: Secondary | ICD-10-CM

## 2013-02-03 DIAGNOSIS — I2510 Atherosclerotic heart disease of native coronary artery without angina pectoris: Secondary | ICD-10-CM | POA: Insufficient documentation

## 2013-02-03 DIAGNOSIS — Z8742 Personal history of other diseases of the female genital tract: Secondary | ICD-10-CM | POA: Insufficient documentation

## 2013-02-03 MED ORDER — ESTRADIOL 1 MG PO TABS: 1.0000 mg | ORAL_TABLET | Freq: Every day | ORAL | Status: AC

## 2013-02-03 MED ORDER — GABAPENTIN 600 MG PO TABS: 600.0000 mg | ORAL_TABLET | Freq: Three times a day (TID) | ORAL | Status: DC

## 2013-02-03 MED ORDER — ALPRAZOLAM 0.5 MG PO TABS
0.5000 mg | ORAL_TABLET | Freq: Once | ORAL | Status: AC
  Administered 2013-02-03: 0.5 mg via ORAL
  Filled 2013-02-03: qty 1

## 2013-02-03 MED ORDER — LISINOPRIL 10 MG PO TABS: 10.0000 mg | ORAL_TABLET | Freq: Every day | ORAL | Status: DC

## 2013-02-03 NOTE — ED Notes (Signed)
Pt states her pcp and her oncologist have both retired this month and she is out of refills for several of her medications-lisinopril, estradiol, neuron tin, xanax, and Prilosec. nad noted.

## 2013-02-03 NOTE — ED Provider Notes (Signed)
History    CSN: [id number] Arrival date & time 02/03/13  1478  First MD Initiated Contact with Patient 02/03/13 0830     Chief Complaint  Patient presents with  . Medication Refill   (Consider location/radiation/quality/duration/timing/severity/associated sxs/prior Treatment) HPI Comments: Danielle Swanson is a 42 y.o. female who presents to the Emergency Department requesting medication refills.  States that she has hx of cancer and her PMD recently retired and she states she is without a PMD.  States she is out of her xanax, lisinopril, neurontin and estrace.  She denies any sx's at present.  States she has been taking her father's xanax and last dose was two days ago.  She denies headache, dizziness, chest pain or shortness of breath.    The history is provided by the patient.   Past Medical History  Diagnosis Date  . Bipolar 1 disorder   . PTSD (post-traumatic stress disorder)   . Fibromyalgia   . Mass of left breast   . Arthritis   . Coronary artery disease   . Myocardial infarct   . Tumors   . Stomach ulcer   . Lung cancer    Past Surgical History  Procedure Laterality Date  . Abdominal hysterectomy     Family History  Problem Relation Age of Onset  . Cancer Other   . Diabetes Other    History  Substance Use Topics  . Smoking status: Current Every Day Smoker -- 1.00 packs/day    Types: Cigarettes  . Smokeless tobacco: Not on file  . Alcohol Use: No   OB History   Grav Para Term Preterm Abortions TAB SAB Ect Mult Living   2 1 1  1  1   1      Review of Systems  Constitutional: Negative for fever, chills and fatigue.  HENT: Negative for sore throat, trouble swallowing, neck pain and neck stiffness.   Respiratory: Negative for cough, shortness of breath and wheezing.   Cardiovascular: Negative for chest pain and palpitations.  Gastrointestinal: Negative for nausea, vomiting and abdominal pain.  Genitourinary: Negative for dysuria and hematuria.   Musculoskeletal: Negative for myalgias and arthralgias.  Skin: Negative for rash.  Neurological: Negative for dizziness, syncope, speech difficulty, weakness, numbness and headaches.  Hematological: Does not bruise/bleed easily.    Allergies  Aspirin; Shellfish allergy; Haldol; Imitrex; Nsaids; Sulfa antibiotics; Toradol; Tylenol; and Ultram  Home Medications   Current Outpatient Rx  Name  Route  Sig  Dispense  Refill  . albuterol-ipratropium (COMBIVENT) 18-103 MCG/ACT inhaler   Inhalation   Inhale 2 puffs into the lungs every 6 (six) hours as needed for wheezing.         Marland Kitchen ALPRAZolam (XANAX) 0.5 MG tablet   Oral   Take 2 tablets (1 mg total) by mouth 3 (three) times daily as needed for anxiety.   10 tablet   0   . Cyanocobalamin (VITAMIN B-12 CR PO)   Oral   Take 1 tablet by mouth daily.         Marland Kitchen desloratadine (CLARINEX) 5 MG tablet   Oral   Take 1 tablet (5 mg total) by mouth daily.   30 tablet   0   . estradiol (ESTRACE) 1 MG tablet   Oral   Take 1 mg by mouth daily.         Marland Kitchen gabapentin (NEURONTIN) 600 MG tablet   Oral   Take 600 mg by mouth 3 (three) times daily.         Marland Kitchen  lisinopril (PRINIVIL,ZESTRIL) 10 MG tablet   Oral   Take 10 mg by mouth daily.         Marland Kitchen omeprazole (PRILOSEC) 20 MG capsule   Oral   Take 20 mg by mouth daily.         . Oxycodone HCl 20 MG TABS   Oral   Take 20 mg by mouth 4 (four) times daily as needed. For pain         . traZODone (DESYREL) 50 MG tablet   Oral   Take 50 mg by mouth at bedtime as needed for sleep.          BP 129/91  Pulse 97  Temp(Src) 98.2 F (36.8 C) (Oral)  Resp 20  Wt 126 lb (57.153 kg)  BMI 21.62 kg/m2  SpO2 100% Physical Exam  Nursing note and vitals reviewed. Constitutional: She is oriented to person, place, and time. She appears well-developed and well-nourished. No distress.  HENT:  Head: Normocephalic and atraumatic.  Cardiovascular: Normal rate, regular rhythm, normal  heart sounds and intact distal pulses.   No murmur heard. Pulmonary/Chest: Effort normal and breath sounds normal. No respiratory distress.  Abdominal: Soft. She exhibits no distension. There is no tenderness. There is no rebound.  Musculoskeletal: Normal range of motion.  Neurological: She is alert and oriented to person, place, and time. She exhibits normal muscle tone. Coordination normal.  Skin: Skin is warm and dry.  Psychiatric: She has a normal mood and affect.    ED Course  Procedures (including critical care time) Labs Reviewed - No data to display   MDM    Previous ED charts reviewed.  Pt is well appearing, no focal neuro deicits on exam.  Pt is here requesting refills of her routine medications.  She states she has pain medication and routinely sees her pain management doctor although she is asking for pain medication here for low back pain.  Pt is ambulatory and w/o concerning back pain sx's.  I have advised that I will give small refills of her lisinopril, neurontin, estrace,  but this is a courtesy  and she will need to arrange f/u with a new PMD.     Patient does not show any signs or symptoms of benzo withdrawal.  Reviewed on the [place] narcortic database.  No recent benzo filled since May .  Patient given one xanax in the dept   Jasminemarie Sherrard L. Trisha Mangle, PA-C 02/05/13 2253

## 2013-02-08 NOTE — ED Provider Notes (Signed)
Medical screening examination/treatment/procedure(s) were performed by non-physician practitioner and as supervising physician I was immediately available for consultation/collaboration.   Rashunda Passon W. Quorra Rosene, MD 02/08/13 0719 

## 2013-02-11 ENCOUNTER — Emergency Department (HOSPITAL_COMMUNITY)
Admission: EM | Admit: 2013-02-11 | Discharge: 2013-02-11 | Discharge status: Against Medical Advice | Payer: Medicaid Other | Attending: Emergency Medicine | Admitting: Emergency Medicine

## 2013-02-11 ENCOUNTER — Encounter (HOSPITAL_COMMUNITY): Payer: Self-pay | Admitting: Emergency Medicine

## 2013-02-11 DIAGNOSIS — F17200 Nicotine dependence, unspecified, uncomplicated: Secondary | ICD-10-CM | POA: Insufficient documentation

## 2013-02-11 DIAGNOSIS — IMO0002 Reserved for concepts with insufficient information to code with codable children: Secondary | ICD-10-CM | POA: Insufficient documentation

## 2013-02-11 DIAGNOSIS — F1910 Other psychoactive substance abuse, uncomplicated: Secondary | ICD-10-CM

## 2013-02-11 DIAGNOSIS — F319 Bipolar disorder, unspecified: Secondary | ICD-10-CM | POA: Insufficient documentation

## 2013-02-11 DIAGNOSIS — Z8659 Personal history of other mental and behavioral disorders: Secondary | ICD-10-CM | POA: Insufficient documentation

## 2013-02-11 DIAGNOSIS — W64XXXA Exposure to other animate mechanical forces, initial encounter: Secondary | ICD-10-CM | POA: Insufficient documentation

## 2013-02-11 DIAGNOSIS — Y9389 Activity, other specified: Secondary | ICD-10-CM | POA: Insufficient documentation

## 2013-02-11 DIAGNOSIS — Z8739 Personal history of other diseases of the musculoskeletal system and connective tissue: Secondary | ICD-10-CM | POA: Insufficient documentation

## 2013-02-11 DIAGNOSIS — Z79899 Other long term (current) drug therapy: Secondary | ICD-10-CM | POA: Insufficient documentation

## 2013-02-11 DIAGNOSIS — I2510 Atherosclerotic heart disease of native coronary artery without angina pectoris: Secondary | ICD-10-CM | POA: Insufficient documentation

## 2013-02-11 DIAGNOSIS — Y929 Unspecified place or not applicable: Secondary | ICD-10-CM | POA: Insufficient documentation

## 2013-02-11 DIAGNOSIS — R509 Fever, unspecified: Secondary | ICD-10-CM | POA: Insufficient documentation

## 2013-02-11 DIAGNOSIS — IMO0001 Reserved for inherently not codable concepts without codable children: Secondary | ICD-10-CM | POA: Insufficient documentation

## 2013-02-11 DIAGNOSIS — Z85118 Personal history of other malignant neoplasm of bronchus and lung: Secondary | ICD-10-CM | POA: Insufficient documentation

## 2013-02-11 DIAGNOSIS — I252 Old myocardial infarction: Secondary | ICD-10-CM | POA: Insufficient documentation

## 2013-02-11 DIAGNOSIS — Z8719 Personal history of other diseases of the digestive system: Secondary | ICD-10-CM | POA: Insufficient documentation

## 2013-02-11 MED ORDER — DOXYCYCLINE HYCLATE 100 MG PO TABS
100.0000 mg | ORAL_TABLET | Freq: Once | ORAL | Status: DC
  Filled 2013-02-11: qty 1

## 2013-02-11 NOTE — ED Provider Notes (Signed)
PT states she was scratched by a 67 week old kitten when treating it's eyes. PT has hx of prior substance abuse but states she has been clean. Pt was last evaluated by [facility] in August and denied substance abuse and had +UDS for cocaine and benzo's.  Pt has small punctate scab like lesions that are all over veins on the dorsum of both hands and on the volar aspect of her wrist. Has mild diffuse swelling of the dorsum of her left hand without redness, red streaks or purulent drainage. The veins feel firm. Has a red raised area on the volar aspect of her left wrist about 1/3 cm x 1 cm. Pt is right handed.    Pt has the appearance of needle marks on the dorsum of both hands and the volar aspect of her wrists. She has mild cellulitis on her left hand ? Phlebitis. I doubt a cat scratch, bite and will not start treatment for rabies, she is very vague about the kitten history.   Medical screening examination/treatment/procedure(s) were conducted as a shared visit with non-physician practitioner(s) and myself.  I personally evaluated the patient during the encounter   Devoria Albe, MD, Franz Dell, MD 02/11/13 [phone]

## 2013-02-11 NOTE — ED Provider Notes (Signed)
History    CSN: [id number] Arrival date & time 02/11/13  1446  First MD Initiated Contact with Patient 02/11/13 1618     Chief Complaint  Patient presents with  . Abrasion   (Consider location/radiation/quality/duration/timing/severity/associated sxs/prior Treatment) HPI Comments: Danielle Swanson is a 42 y.o. female who presents to the Emergency Department complaining of pain and swelling to left hand for four days.  States that she was trying to take care of a stray kitten when it scratched her hand. She c/o pain with movement of the fingers and gripping.  She reports some fever and chills earlier today.  She states that she has not reported the incident to animal control.  Last Td was 2 yrs ago.    The history is provided by the patient.   Past Medical History  Diagnosis Date  . Bipolar 1 disorder   . PTSD (post-traumatic stress disorder)   . Fibromyalgia   . Mass of left breast   . Arthritis   . Coronary artery disease   . Myocardial infarct   . Tumors   . Stomach ulcer   . Lung cancer    Past Surgical History  Procedure Laterality Date  . Abdominal hysterectomy     Family History  Problem Relation Age of Onset  . Cancer Other   . Diabetes Other    History  Substance Use Topics  . Smoking status: Current Every Day Smoker -- 1.00 packs/day    Types: Cigarettes  . Smokeless tobacco: Not on file  . Alcohol Use: No   OB History   Grav Para Term Preterm Abortions TAB SAB Ect Mult Living   2 1 1  1  1   1      Review of Systems  Constitutional: Positive for fever and chills. Negative for activity change and appetite change.  Respiratory: Negative for cough and shortness of breath.   Gastrointestinal: Negative for nausea and vomiting.  Musculoskeletal: Negative for arthralgias.  Skin: Positive for wound. Negative for color change.  Neurological: Negative for dizziness, weakness, numbness and headaches.  Hematological: Negative for adenopathy. Does not bruise/bleed  easily.  All other systems reviewed and are negative.    Allergies  Aspirin; Haldol; Imitrex; Nsaids; Sulfa antibiotics; Toradol; Tylenol; and Ultram  Home Medications   Current Outpatient Rx  Name  Route  Sig  Dispense  Refill  . albuterol-ipratropium (COMBIVENT) 18-103 MCG/ACT inhaler   Inhalation   Inhale 2 puffs into the lungs every 6 (six) hours as needed for wheezing or shortness of breath.          . ALPRAZolam (XANAX) 0.5 MG tablet   Oral   Take 2 tablets (1 mg total) by mouth 3 (three) times daily as needed for anxiety.   10 tablet   0   . Cyanocobalamin (VITAMIN B-12 CR PO)   Oral   Take 1 tablet by mouth daily.         Marland Kitchen desloratadine (CLARINEX) 5 MG tablet   Oral   Take 1 tablet (5 mg total) by mouth daily.   30 tablet   0   . estradiol (ESTRACE) 1 MG tablet   Oral   Take 1 tablet (1 mg total) by mouth daily.   12 tablet   0   . gabapentin (NEURONTIN) 600 MG tablet   Oral   Take 1 tablet (600 mg total) by mouth 3 (three) times daily.   21 tablet   0   . lisinopril (PRINIVIL,ZESTRIL)  10 MG tablet   Oral   Take 1 tablet (10 mg total) by mouth daily.   12 tablet   0   . Multiple Vitamin (MULTIVITAMIN WITH MINERALS) TABS   Oral   Take 1 tablet by mouth daily.         Marland Kitchen omeprazole (PRILOSEC) 20 MG capsule   Oral   Take 20 mg by mouth daily.         . Oxycodone HCl 20 MG TABS   Oral   Take 20 mg by mouth 4 (four) times daily as needed. For pain         . Phenylephrine-Acetaminophen (VICKS DAYQUIL SINUS) 5-325 MG CAPS   Oral   Take 1 capsule by mouth daily as needed (for allergy relief).         . traZODone (DESYREL) 50 MG tablet   Oral   Take 50 mg by mouth at bedtime as needed for sleep.          BP 114/72  Pulse 96  Temp(Src) 98.6 F (37 C) (Oral)  Resp 18  Ht 5\' 3"  (1.6 m)  Wt 123 lb (55.792 kg)  BMI 21.79 kg/m2  SpO2 100% Physical Exam  Nursing note and vitals reviewed. Constitutional: She is oriented to person,  place, and time. She appears well-developed and well-nourished. No distress.  HENT:  Head: Normocephalic and atraumatic.  Neck: Normal range of motion. Neck supple.  Cardiovascular: Normal rate, regular rhythm, normal heart sounds and intact distal pulses.   No murmur heard. Pulmonary/Chest: Effort normal and breath sounds normal. No respiratory distress.  Musculoskeletal: She exhibits edema and tenderness.  Several pin point scabbed lesions to the dorsal left hand over the veins of the hand.  Mild STS present, no erythema, drainage or excessive warmth.  Veins are hard and ttp.  Radial pulse is brisk, distal sensation intact.  CR< 3 sec.    Neurological: She is alert and oriented to person, place, and time. She exhibits normal muscle tone. Coordination normal.  Skin: Skin is warm and dry. No rash noted. No erythema.  See skin exam    ED Course  Procedures (including critical care time) Labs Reviewed  URINE RAPID DRUG SCREEN (HOSP PERFORMED)     MDM    Previous ED charts reviewed.    Patient has mild STS of the dorsal left hand with several pin point scabbed lesions along the veins of the left dorsal hand with ttp along the vein.  Patient reports hx of scratches from a kitten, but lesions appear c/w needle tracks.  Patient is non-toxic appearing, no heart murmur.  Patient also seen by Dr. Devoria Albe.  Care plan discussed.    Patient also requesting pain medication and "a few xanax's" until she can get a refill from her PCP.     Pt has prior hx of [facility] admission last year and does report hx of substance abuse and prior IV drug use 6 years ago, but denies recent use. Per [facility] note from last year, pt had positive UDS for cocaine and benzos.   No obvious erythema, excessive warmth, or fever at this time  1720  Patient was asked for urine sample, and I was informed by the nursing staff when she went back to the patient's room to give medication , patient had left without informing the myself  or staff.       Georgie Haque L. Trisha Mangle, PA-C 02/12/13 0136

## 2013-02-11 NOTE — ED Notes (Addendum)
Pt asked to get a U/A, she could not get one at present. When I went into room to give medication , she was no longer there.

## 2013-02-11 NOTE — ED Notes (Signed)
Unable to locate pt  

## 2013-02-11 NOTE — ED Notes (Signed)
Pt states a cat scratched her hands four days ago and thinks that are infected now.

## 2013-02-11 NOTE — ED Notes (Addendum)
Swelling of lt hand after cat scratch. Areas look like small pin pricks.

## 2013-02-12 NOTE — ED Provider Notes (Signed)
See prior note   Ward Givens, MD 02/12/13 [phone]

## 2013-02-19 ENCOUNTER — Encounter (HOSPITAL_COMMUNITY): Payer: Self-pay | Admitting: Emergency Medicine

## 2013-02-19 ENCOUNTER — Emergency Department (HOSPITAL_COMMUNITY)
Admission: EM | Admit: 2013-02-19 | Discharge: 2013-02-19 | Disposition: A | Discharge status: Home / Self Care | Payer: Medicaid Other | Attending: Emergency Medicine | Admitting: Emergency Medicine

## 2013-02-19 DIAGNOSIS — F319 Bipolar disorder, unspecified: Secondary | ICD-10-CM | POA: Insufficient documentation

## 2013-02-19 DIAGNOSIS — I252 Old myocardial infarction: Secondary | ICD-10-CM | POA: Insufficient documentation

## 2013-02-19 DIAGNOSIS — I2510 Atherosclerotic heart disease of native coronary artery without angina pectoris: Secondary | ICD-10-CM | POA: Insufficient documentation

## 2013-02-19 DIAGNOSIS — Z85118 Personal history of other malignant neoplasm of bronchus and lung: Secondary | ICD-10-CM | POA: Insufficient documentation

## 2013-02-19 DIAGNOSIS — Z8659 Personal history of other mental and behavioral disorders: Secondary | ICD-10-CM | POA: Insufficient documentation

## 2013-02-19 DIAGNOSIS — Z8742 Personal history of other diseases of the female genital tract: Secondary | ICD-10-CM | POA: Insufficient documentation

## 2013-02-19 DIAGNOSIS — F1123 Opioid dependence with withdrawal: Secondary | ICD-10-CM

## 2013-02-19 DIAGNOSIS — F17200 Nicotine dependence, unspecified, uncomplicated: Secondary | ICD-10-CM | POA: Insufficient documentation

## 2013-02-19 DIAGNOSIS — F19939 Other psychoactive substance use, unspecified with withdrawal, unspecified: Secondary | ICD-10-CM | POA: Insufficient documentation

## 2013-02-19 DIAGNOSIS — Z8719 Personal history of other diseases of the digestive system: Secondary | ICD-10-CM | POA: Insufficient documentation

## 2013-02-19 DIAGNOSIS — Z8739 Personal history of other diseases of the musculoskeletal system and connective tissue: Secondary | ICD-10-CM | POA: Insufficient documentation

## 2013-02-19 DIAGNOSIS — Z79899 Other long term (current) drug therapy: Secondary | ICD-10-CM | POA: Insufficient documentation

## 2013-02-19 MED ORDER — OXYCODONE HCL 15 MG PO TABS: 15.0000 mg | ORAL_TABLET | Freq: Four times a day (QID) | ORAL | Status: DC | PRN

## 2013-02-19 MED ORDER — LORAZEPAM 1 MG PO TABS
1.0000 mg | ORAL_TABLET | Freq: Once | ORAL | Status: AC
  Administered 2013-02-19: 1 mg via ORAL
  Filled 2013-02-19: qty 1

## 2013-02-19 MED ORDER — FENTANYL CITRATE 0.05 MG/ML IJ SOLN
100.0000 ug | Freq: Once | INTRAMUSCULAR | Status: AC
  Administered 2013-02-19: 100 ug via INTRAMUSCULAR
  Filled 2013-02-19: qty 2

## 2013-02-19 MED ORDER — ALPRAZOLAM 1 MG PO TABS: 1.0000 mg | ORAL_TABLET | Freq: Three times a day (TID) | ORAL | Status: AC | PRN

## 2013-02-19 NOTE — ED Notes (Signed)
Pt c/o withdrawal from percocet 20mg  QID; pt sts ran out 2 days ago; pt sts chills, N/V/D

## 2013-02-19 NOTE — ED Provider Notes (Signed)
CSN: [id number]     Arrival date & time 02/19/13  1133 History     First MD Initiated Contact with Patient 02/19/13 1159     Chief Complaint  Patient presents with  . Withdrawal   (Consider location/radiation/quality/duration/timing/severity/associated sxs/prior Treatment) HPI Patient presents to the emergency department with withdrawal from opiate pain medication.  The patient, states she ran her pain medicine 3 days, ago.  Patient, states she needs a new primary doctor and pain management clinic.  Patient, states, that she's having total body pain and cramping.  Patient denies chest pain, vomiting, abdominal pain, headache, blurred vision, weakness, numbness, dizziness, fever, shortness of breath, or syncope.  Patient, states she has not taken any medications prior to arrival. Past Medical History  Diagnosis Date  . Bipolar 1 disorder   . PTSD (post-traumatic stress disorder)   . Fibromyalgia   . Mass of left breast   . Arthritis   . Coronary artery disease   . Myocardial infarct   . Tumors   . Stomach ulcer   . Lung cancer    Past Surgical History  Procedure Laterality Date  . Abdominal hysterectomy     Family History  Problem Relation Age of Onset  . Cancer Other   . Diabetes Other    History  Substance Use Topics  . Smoking status: Current Every Day Smoker -- 1.00 packs/day    Types: Cigarettes  . Smokeless tobacco: Not on file  . Alcohol Use: No   OB History   Grav Para Term Preterm Abortions TAB SAB Ect Mult Living   2 1 1  1  1   1      Review of Systems All other systems negative except as documented in the HPI. All pertinent positives and negatives as reviewed in the HPI.  Allergies  Aspirin; Haldol; Imitrex; Nsaids; Sulfa antibiotics; Toradol; Tylenol; and Ultram  Home Medications   Current Outpatient Rx  Name  Route  Sig  Dispense  Refill  . albuterol-ipratropium (COMBIVENT) 18-103 MCG/ACT inhaler   Inhalation   Inhale 2 puffs into the lungs every  6 (six) hours as needed for wheezing or shortness of breath.          . ALPRAZolam (XANAX) 0.5 MG tablet   Oral   Take 2 tablets (1 mg total) by mouth 3 (three) times daily as needed for anxiety.   10 tablet   0   . Cyanocobalamin (VITAMIN B-12 CR PO)   Oral   Take 1 tablet by mouth daily.         Marland Kitchen desloratadine (CLARINEX) 5 MG tablet   Oral   Take 1 tablet (5 mg total) by mouth daily.   30 tablet   0   . estradiol (ESTRACE) 1 MG tablet   Oral   Take 1 tablet (1 mg total) by mouth daily.   12 tablet   0   . gabapentin (NEURONTIN) 600 MG tablet   Oral   Take 1 tablet (600 mg total) by mouth 3 (three) times daily.   21 tablet   0   . lisinopril (PRINIVIL,ZESTRIL) 10 MG tablet   Oral   Take 1 tablet (10 mg total) by mouth daily.   12 tablet   0   . Multiple Vitamin (MULTIVITAMIN WITH MINERALS) TABS   Oral   Take 1 tablet by mouth daily.         Marland Kitchen omeprazole (PRILOSEC) 20 MG capsule   Oral   Take 20  mg by mouth daily.         . Oxycodone HCl 20 MG TABS   Oral   Take 20 mg by mouth 4 (four) times daily as needed. For pain         . Phenylephrine-Acetaminophen (VICKS DAYQUIL SINUS) 5-325 MG CAPS   Oral   Take 1 capsule by mouth daily as needed (for allergy relief).         . traZODone (DESYREL) 50 MG tablet   Oral   Take 50 mg by mouth at bedtime as needed for sleep.          BP 117/87  Pulse 84  Temp(Src) 98.4 F (36.9 C) (Oral)  Resp 18  Ht 5\' 2"  (1.575 m)  Wt 126 lb (57.153 kg)  BMI 23.04 kg/m2  SpO2 100% Physical Exam  Nursing note and vitals reviewed. Constitutional: She is oriented to person, place, and time. She appears well-developed and well-nourished. No distress.  HENT:  Head: Normocephalic and atraumatic.  Eyes: Pupils are equal, round, and reactive to light.  Neck: Normal range of motion. Neck supple.  Cardiovascular: Normal rate, regular rhythm and normal heart sounds.  Exam reveals no gallop and no friction rub.   No  murmur heard. Pulmonary/Chest: Effort normal and breath sounds normal. No respiratory distress.  Neurological: She is alert and oriented to person, place, and time. She exhibits normal muscle tone. Coordination normal.  Skin: Skin is warm and dry. No rash noted.    ED Course   Procedures (including critical care time)  Patient presents for withdrawal from pain medication, but does not want any help detoxing.  Advised patient and give her a limited quantity, medications.  She'll need to followup with her primary care Dr. for further evaluation.  Told to return here as needed.  Advised to increase her fluid intake, as well.  Patient is not actively vomiting.  Patient's vital signs have been stable as well  MDM    Carlyle Dolly, PA-C 02/19/13 1247

## 2013-02-19 NOTE — ED Provider Notes (Signed)
Medical screening examination/treatment/procedure(s) were performed by non-physician practitioner and as supervising physician I was immediately available for consultation/collaboration.  Flint Melter, MD 02/19/13 [phone]

## 2013-02-25 ENCOUNTER — Ambulatory Visit: Payer: Medicaid Other | Admitting: Gastroenterology

## 2013-02-25 ENCOUNTER — Telehealth: Payer: Self-pay | Admitting: Gastroenterology

## 2013-02-25 NOTE — Telephone Encounter (Signed)
Pt was a no show

## 2013-02-25 NOTE — Telephone Encounter (Signed)
Letter mailed

## 2013-02-25 NOTE — Telephone Encounter (Signed)
Please send letter for follow-up.  

## 2013-04-10 IMAGING — CT CT HEAD W/O CM
1 series · 16 of 30 positions shown, 20 images · non-contrast
Comparison: 08/25/2010

CLINICAL DATA: Migraine.

CT HEAD WITHOUT CONTRAST
TECHNIQUE: Contiguous axial images were obtained from the base of
the skull through the vertex without contrast.

[Series 2: headseq 4.8 h37s · axial · 0.43mm/px · z∈[+135,+290]mm · 16 of 36 slices shown, 20 images]
[im 2/36  brain]
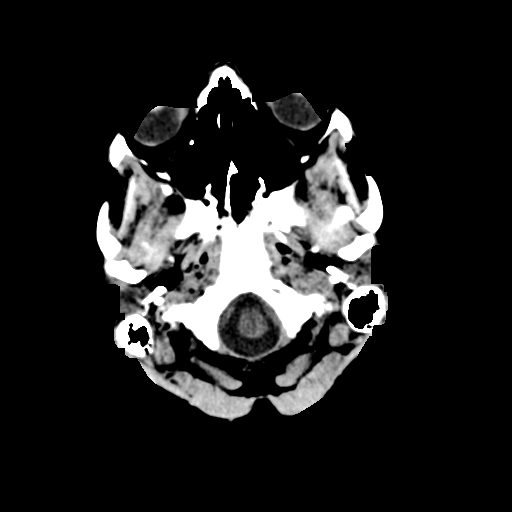
[im 2/36  bone]
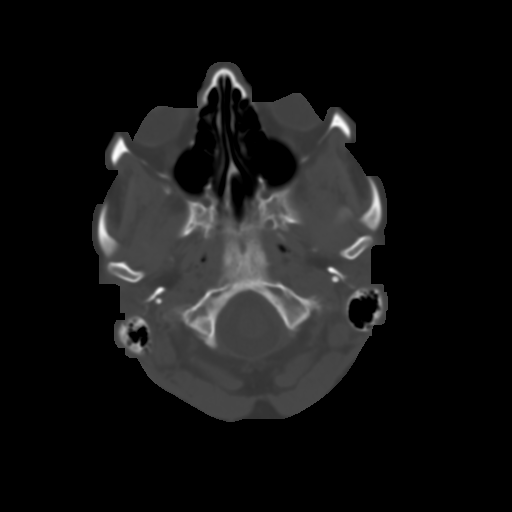
[im 4/36  brain]
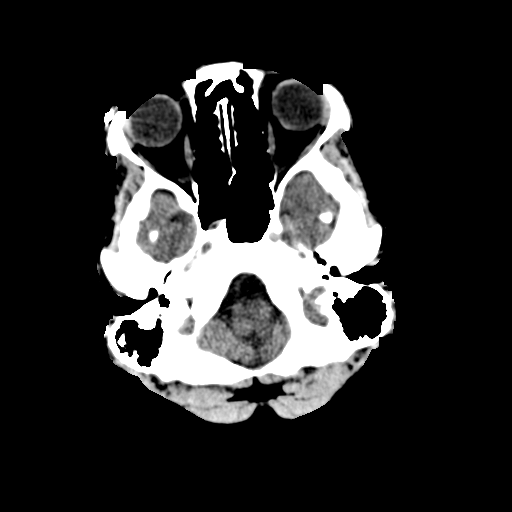
[im 7/36  brain]
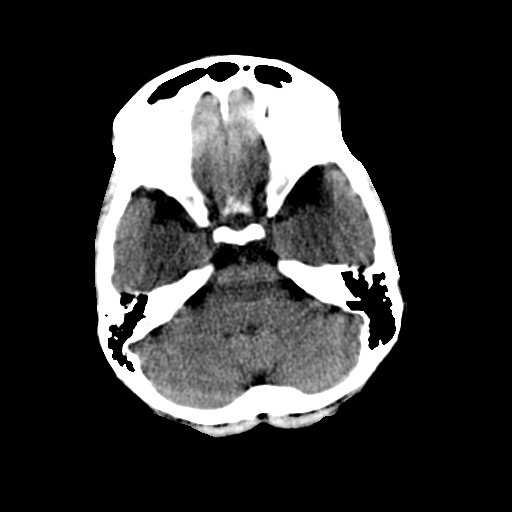
[im 9/36  brain]
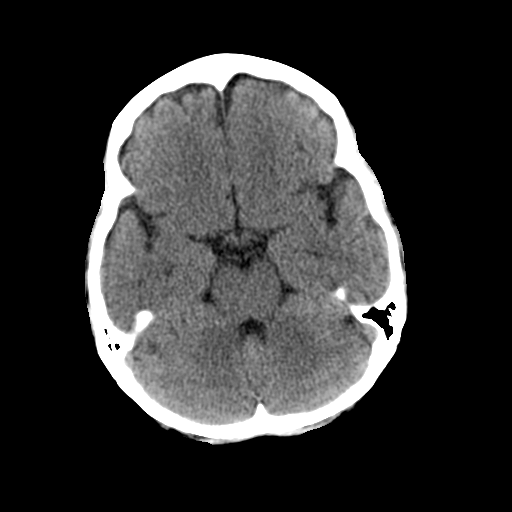
[im 10/36  brain]
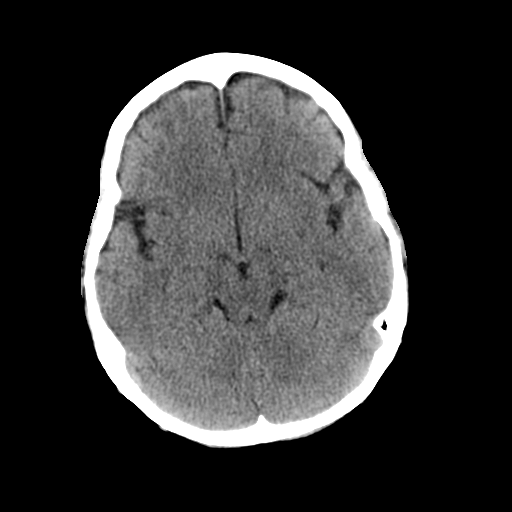
[im 10/36  bone]
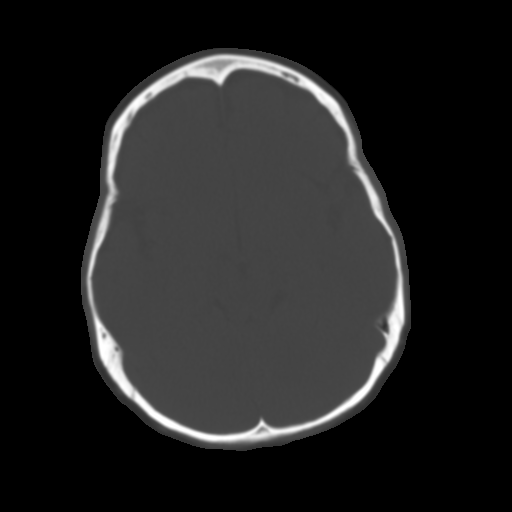
[im 13/36  brain]
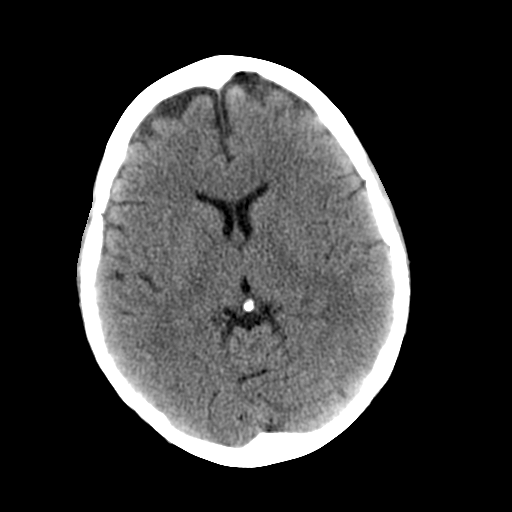
[im 15/36  brain]
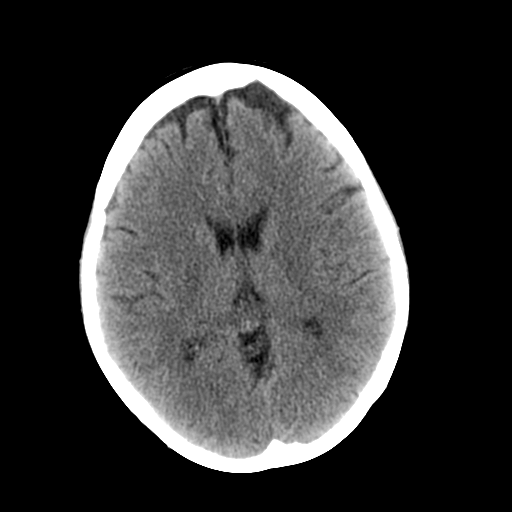
[im 17/36  brain]
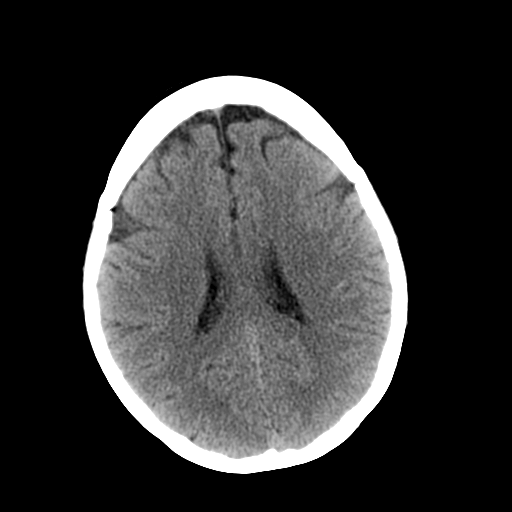
[im 19/36  brain]
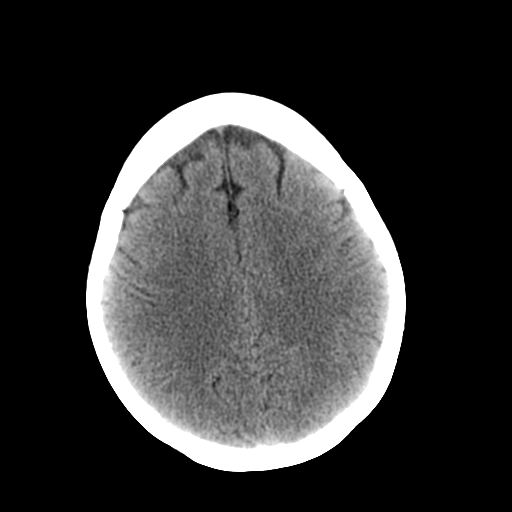
[im 19/36  bone]
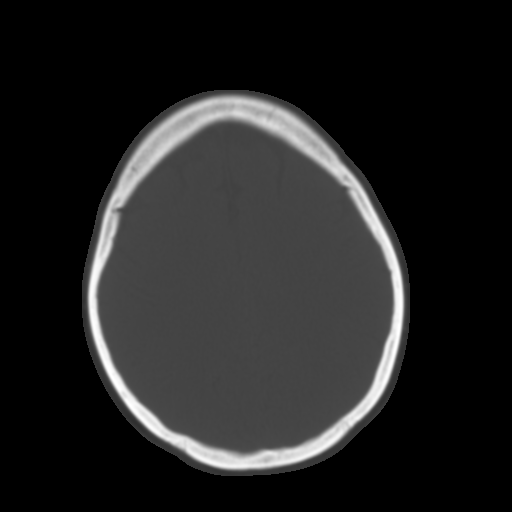
[im 21/36  brain]
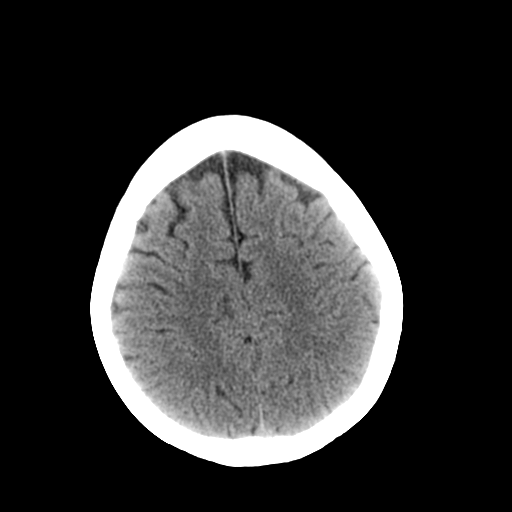
[im 23/36  brain]
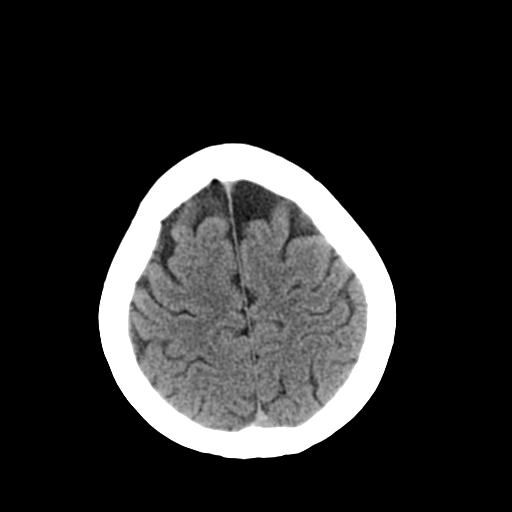
[im 26/36  brain]
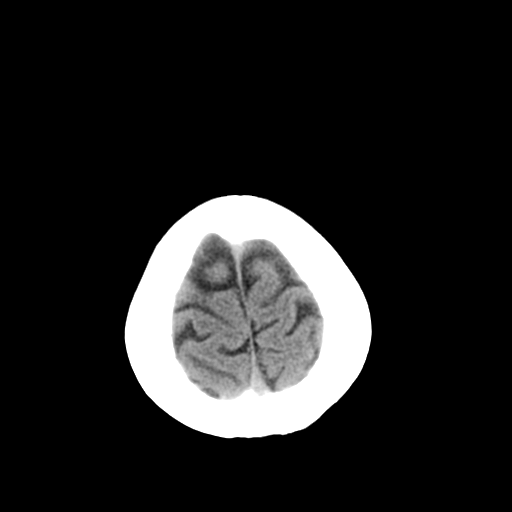
[im 27/36  brain]
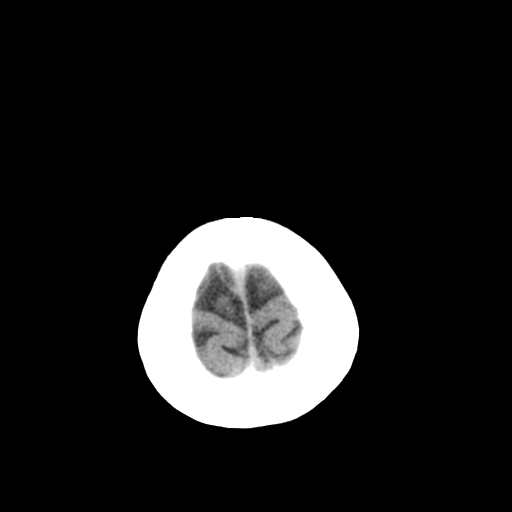
[im 27/36  bone]
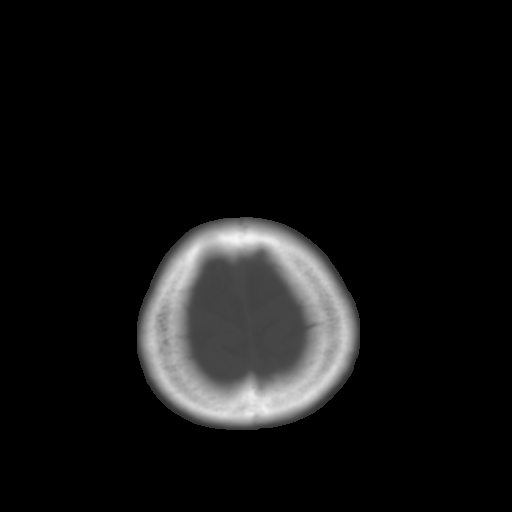
[im 29/36  brain]
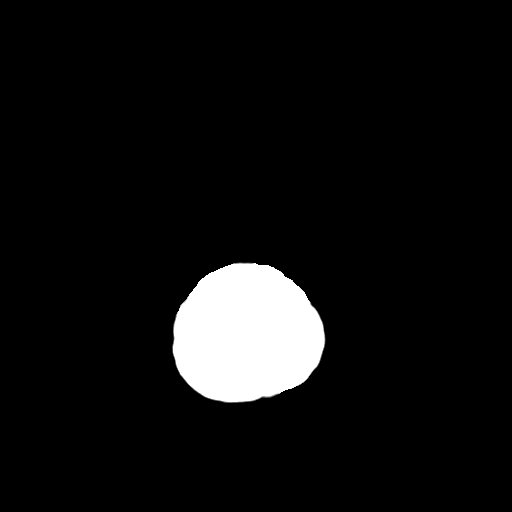
[im 32/36  brain]
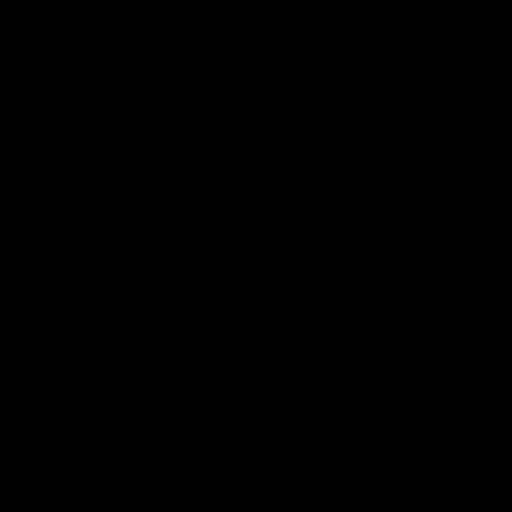
[im 34/36  brain]
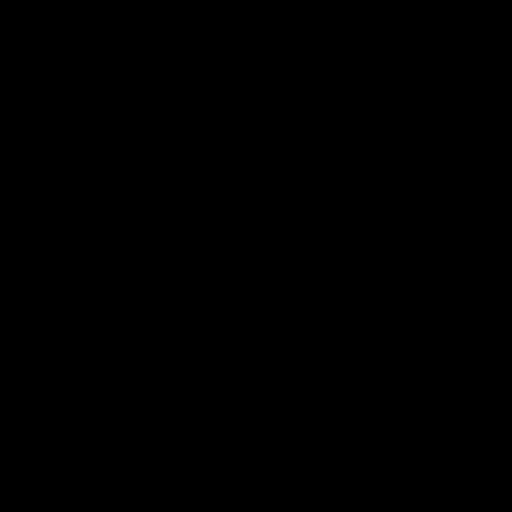

[16 of 30 positions shown; findings below may reference images not displayed]

FINDINGS: No acute intracranial abnormality.  Specifically, no
hemorrhage, hydrocephalus, mass lesion, acute infarction, or
significant intracranial injury.  No acute calvarial abnormality.
Visualized paranasal sinuses and mastoids clear.  Orbital soft
tissues unremarkable.
IMPRESSION: No acute intracranial abnormality.

## 2013-04-11 ENCOUNTER — Emergency Department (HOSPITAL_COMMUNITY)
Admission: EM | Admit: 2013-04-11 | Discharge: 2013-04-11 | Disposition: A | Discharge status: Home / Self Care | Payer: Medicaid Other | Attending: Emergency Medicine | Admitting: Emergency Medicine

## 2013-04-11 ENCOUNTER — Encounter (HOSPITAL_COMMUNITY): Payer: Self-pay | Admitting: *Deleted

## 2013-04-11 DIAGNOSIS — Z8719 Personal history of other diseases of the digestive system: Secondary | ICD-10-CM | POA: Insufficient documentation

## 2013-04-11 DIAGNOSIS — IMO0001 Reserved for inherently not codable concepts without codable children: Secondary | ICD-10-CM | POA: Insufficient documentation

## 2013-04-11 DIAGNOSIS — Z79899 Other long term (current) drug therapy: Secondary | ICD-10-CM | POA: Insufficient documentation

## 2013-04-11 DIAGNOSIS — M549 Dorsalgia, unspecified: Secondary | ICD-10-CM | POA: Insufficient documentation

## 2013-04-11 DIAGNOSIS — M129 Arthropathy, unspecified: Secondary | ICD-10-CM | POA: Insufficient documentation

## 2013-04-11 DIAGNOSIS — F319 Bipolar disorder, unspecified: Secondary | ICD-10-CM | POA: Insufficient documentation

## 2013-04-11 DIAGNOSIS — I252 Old myocardial infarction: Secondary | ICD-10-CM | POA: Insufficient documentation

## 2013-04-11 DIAGNOSIS — G8929 Other chronic pain: Secondary | ICD-10-CM | POA: Insufficient documentation

## 2013-04-11 DIAGNOSIS — I2510 Atherosclerotic heart disease of native coronary artery without angina pectoris: Secondary | ICD-10-CM | POA: Insufficient documentation

## 2013-04-11 DIAGNOSIS — Z3202 Encounter for pregnancy test, result negative: Secondary | ICD-10-CM | POA: Insufficient documentation

## 2013-04-11 DIAGNOSIS — Z90710 Acquired absence of both cervix and uterus: Secondary | ICD-10-CM | POA: Insufficient documentation

## 2013-04-11 DIAGNOSIS — K2970 Gastritis, unspecified, without bleeding: Secondary | ICD-10-CM | POA: Insufficient documentation

## 2013-04-11 DIAGNOSIS — F17200 Nicotine dependence, unspecified, uncomplicated: Secondary | ICD-10-CM | POA: Insufficient documentation

## 2013-04-11 DIAGNOSIS — F4310 Post-traumatic stress disorder, unspecified: Secondary | ICD-10-CM | POA: Insufficient documentation

## 2013-04-11 DIAGNOSIS — Z85118 Personal history of other malignant neoplasm of bronchus and lung: Secondary | ICD-10-CM | POA: Insufficient documentation

## 2013-04-11 LAB — COMPREHENSIVE METABOLIC PANEL
ALT: 17 U/L (ref 0–35)
AST: 17 U/L (ref 0–37)
Albumin: 3.9 g/dL (ref 3.5–5.2)
Alkaline Phosphatase: 81 U/L (ref 39–117)
Potassium: 4.1 mEq/L (ref 3.5–5.1)
Sodium: 139 mEq/L (ref 135–145)
Total Protein: 6.9 g/dL (ref 6.0–8.3)

## 2013-04-11 LAB — CBC WITH DIFFERENTIAL/PLATELET
Basophils Absolute: 0.1 10*3/uL (ref 0.0–0.1)
Basophils Relative: 2 % — ABNORMAL HIGH (ref 0–1)
Eosinophils Absolute: 0.7 10*3/uL (ref 0.0–0.7)
Lymphs Abs: 4.3 10*3/uL — ABNORMAL HIGH (ref 0.7–4.0)
MCH: 31.1 pg (ref 26.0–34.0)
MCHC: 33.4 g/dL (ref 30.0–36.0)
Neutrophils Relative %: 32 % — ABNORMAL LOW (ref 43–77)
Platelets: 370 10*3/uL (ref 150–400)
RBC: 4.28 MIL/uL (ref 3.87–5.11)
WBC: 8.5 10*3/uL (ref 4.0–10.5)

## 2013-04-11 LAB — URINALYSIS, ROUTINE W REFLEX MICROSCOPIC
Bilirubin Urine: NEGATIVE
Specific Gravity, Urine: >1.03 — ABNORMAL HIGH (ref 1.005–1.030)
pH: 6 (ref 5.0–8.0)

## 2013-04-11 LAB — URINE MICROSCOPIC-ADD ON

## 2013-04-11 LAB — PREGNANCY, URINE: Preg Test, Ur: NEGATIVE

## 2013-04-11 MED ORDER — PROMETHAZINE HCL 25 MG PO TABS: 25.0000 mg | ORAL_TABLET | Freq: Four times a day (QID) | ORAL | Status: DC | PRN

## 2013-04-11 MED ORDER — ONDANSETRON HCL 4 MG/2ML IJ SOLN
4.0000 mg | Freq: Once | INTRAMUSCULAR | Status: AC
  Administered 2013-04-11: 4 mg via INTRAVENOUS
  Filled 2013-04-11: qty 2

## 2013-04-11 MED ORDER — SODIUM CHLORIDE 0.9 % IV BOLUS (SEPSIS)
1000.0000 mL | Freq: Once | INTRAVENOUS | Status: AC
  Administered 2013-04-11: 1000 mL via INTRAVENOUS

## 2013-04-11 MED ORDER — PANTOPRAZOLE SODIUM 40 MG IV SOLR
40.0000 mg | Freq: Once | INTRAVENOUS | Status: AC
  Administered 2013-04-11: 40 mg via INTRAVENOUS
  Filled 2013-04-11: qty 40

## 2013-04-11 MED ORDER — HYDROMORPHONE HCL PF 1 MG/ML IJ SOLN
1.0000 mg | Freq: Once | INTRAMUSCULAR | Status: AC
  Administered 2013-04-11: 1 mg via INTRAVENOUS
  Filled 2013-04-11: qty 1

## 2013-04-11 MED ORDER — PANTOPRAZOLE SODIUM 20 MG PO TBEC: 40.0000 mg | DELAYED_RELEASE_TABLET | Freq: Every day | ORAL | Status: DC

## 2013-04-11 NOTE — ED Notes (Signed)
abd pain, vomiting.  Back pain,  diarrhea

## 2013-04-11 NOTE — ED Notes (Signed)
Track marks noted left AC. IV inserted right AC

## 2013-04-11 NOTE — ED Provider Notes (Signed)
CSN: [id number]     Arrival date & time 04/11/13  2009 History  This chart was scribed for Donnetta Hutching, MD by Shari Heritage, ED Scribe. The patient was seen in room [facility]03/[facility]03. Patient's care was started at 9:29 PM.    Chief Complaint  Patient presents with  . Abdominal Pain    The history is provided by the patient. No language interpreter was used.    HPI Comments: Danielle Swanson is a 42 y.o. female who presents to the Emergency Department complaining of constant, moderate, diffuse abdominal pain and vomiting for the past day. She says that today she noticed a small amount of bright red blood in her emesis prompting her to come to the ED. She is currently being seen at a pain clinic for management of her chronic back pain. She says that she stopped taking suboxone 3 days ago after being on it for less than 2 weeks. She denies any other symptoms at this time. Her other medical history includes lung cancer (dx in 1998), coronary artery disease and MI.  Past Medical History  Diagnosis Date  . Bipolar 1 disorder   . PTSD (post-traumatic stress disorder)   . Fibromyalgia   . Mass of left breast   . Arthritis   . Coronary artery disease   . Myocardial infarct   . Tumors   . Stomach ulcer   . Lung cancer    Past Surgical History  Procedure Laterality Date  . Abdominal hysterectomy    . Breast surgery     Family History  Problem Relation Age of Onset  . Cancer Other   . Diabetes Other    History  Substance Use Topics  . Smoking status: Current Every Day Smoker -- 1.00 packs/day    Types: Cigarettes  . Smokeless tobacco: Not on file  . Alcohol Use: No   OB History   Grav Para Term Preterm Abortions TAB SAB Ect Mult Living   2 1 1  1  1   1      Review of Systems A complete 10 system review of systems was obtained and all systems are negative except as noted in the HPI and PMH.   Allergies  Aspirin; Haldol; Imitrex; Nsaids; Sulfa antibiotics; Toradol; Tylenol; and  Ultram  Home Medications   Current Outpatient Rx  Name  Route  Sig  Dispense  Refill  . albuterol-ipratropium (COMBIVENT) 18-103 MCG/ACT inhaler   Inhalation   Inhale 2 puffs into the lungs every 6 (six) hours as needed for wheezing or shortness of breath.          . ALPRAZolam (XANAX) 0.5 MG tablet   Oral   Take 2 tablets (1 mg total) by mouth 3 (three) times daily as needed for anxiety.   10 tablet   0   . ALPRAZolam (XANAX) 1 MG tablet   Oral   Take 1 tablet (1 mg total) by mouth 3 (three) times daily as needed for anxiety.   12 tablet   0   . Cyanocobalamin (VITAMIN B-12 CR PO)   Oral   Take 1 tablet by mouth daily.         Marland Kitchen desloratadine (CLARINEX) 5 MG tablet   Oral   Take 1 tablet (5 mg total) by mouth daily.   30 tablet   0   . estradiol (ESTRACE) 1 MG tablet   Oral   Take 1 tablet (1 mg total) by mouth daily.   12 tablet  0   . gabapentin (NEURONTIN) 600 MG tablet   Oral   Take 1 tablet (600 mg total) by mouth 3 (three) times daily.   21 tablet   0   . lisinopril (PRINIVIL,ZESTRIL) 10 MG tablet   Oral   Take 1 tablet (10 mg total) by mouth daily.   12 tablet   0   . Multiple Vitamin (MULTIVITAMIN WITH MINERALS) TABS   Oral   Take 1 tablet by mouth daily.         Marland Kitchen omeprazole (PRILOSEC) 20 MG capsule   Oral   Take 20 mg by mouth daily.         Marland Kitchen oxyCODONE (ROXICODONE) 15 MG immediate release tablet   Oral   Take 1 tablet (15 mg total) by mouth every 6 (six) hours as needed for pain.   15 tablet   0   . Oxycodone HCl 20 MG TABS   Oral   Take 20 mg by mouth 4 (four) times daily as needed. For pain         . Phenylephrine-Acetaminophen (VICKS DAYQUIL SINUS) 5-325 MG CAPS   Oral   Take 1 capsule by mouth daily as needed (for allergy relief).         . traZODone (DESYREL) 50 MG tablet   Oral   Take 50 mg by mouth at bedtime as needed for sleep.          Triage Vitals: BP 107/49  Pulse 81  Temp(Src) 98.1 F (36.7 C)  (Oral)  Resp 20  Ht 5' 2.5" (1.588 m)  Wt 120 lb (54.432 kg)  BMI 21.59 kg/m2  SpO2 98% Physical Exam  Nursing note and vitals reviewed. Constitutional: She is oriented to person, place, and time. She appears well-developed and well-nourished.  HENT:  Head: Normocephalic and atraumatic.  Eyes: Conjunctivae and EOM are normal. Pupils are equal, round, and reactive to light.  Neck: Normal range of motion. Neck supple.  Cardiovascular: Normal rate, regular rhythm and normal heart sounds.   Pulmonary/Chest: Effort normal and breath sounds normal.  Abdominal: Soft. Bowel sounds are normal.  Musculoskeletal: Normal range of motion.  Neurological: She is alert and oriented to person, place, and time.  Skin: Skin is warm and dry.  Psychiatric: She has a normal mood and affect.    ED Course  Procedures (including critical care time) DIAGNOSTIC STUDIES: Oxygen Saturation is 98% on room air, normal by my interpretation.    COORDINATION OF CARE: 9:45 PM- Will give IV fluids, Zofran, Protonix and pain medicines. Will order labs. Patient informed of current plan for treatment and evaluation and agrees with plan at this time.    Labs Review Labs Reviewed  CBC WITH DIFFERENTIAL - Abnormal; Notable for the following:    Neutrophils Relative % 32 (*)    Lymphocytes Relative 51 (*)    Lymphs Abs 4.3 (*)    Eosinophils Relative 8 (*)    Basophils Relative 2 (*)    All other components within normal limits  COMPREHENSIVE METABOLIC PANEL - Abnormal; Notable for the following:    Total Bilirubin <0.1 (*)    GFR calc non Af Amer 76 (*)    GFR calc Af Amer 88 (*)    All other components within normal limits  URINALYSIS, ROUTINE W REFLEX MICROSCOPIC - Abnormal; Notable for the following:    Specific Gravity, Urine >1.030 (*)    Hgb urine dipstick TRACE (*)    Ketones, ur TRACE (*)  All other components within normal limits  LIPASE, BLOOD  PREGNANCY, URINE  URINE MICROSCOPIC-ADD ON     Imaging Review No results found.  MDM  No diagnosis found. No acute abdomen. Patient feels better after IV fluids and IV Protonix, IV Zofran I personally performed the services described in this documentation, which was scribed in my presence. The recorded information has been reviewed and is accurate.    Donnetta Hutching, MD 04/12/13 [phone]

## 2013-04-14 ENCOUNTER — Encounter (HOSPITAL_COMMUNITY): Payer: Self-pay | Admitting: *Deleted

## 2013-04-14 ENCOUNTER — Emergency Department (HOSPITAL_COMMUNITY)
Admission: EM | Admit: 2013-04-14 | Discharge: 2013-04-14 | Disposition: A | Discharge status: Home / Self Care | Payer: Medicaid Other | Attending: Emergency Medicine | Admitting: Emergency Medicine

## 2013-04-14 DIAGNOSIS — G8929 Other chronic pain: Secondary | ICD-10-CM

## 2013-04-14 DIAGNOSIS — F319 Bipolar disorder, unspecified: Secondary | ICD-10-CM | POA: Insufficient documentation

## 2013-04-14 DIAGNOSIS — R1012 Left upper quadrant pain: Secondary | ICD-10-CM | POA: Insufficient documentation

## 2013-04-14 DIAGNOSIS — Z8739 Personal history of other diseases of the musculoskeletal system and connective tissue: Secondary | ICD-10-CM | POA: Insufficient documentation

## 2013-04-14 DIAGNOSIS — Z90710 Acquired absence of both cervix and uterus: Secondary | ICD-10-CM | POA: Insufficient documentation

## 2013-04-14 DIAGNOSIS — Z79899 Other long term (current) drug therapy: Secondary | ICD-10-CM | POA: Insufficient documentation

## 2013-04-14 DIAGNOSIS — I2510 Atherosclerotic heart disease of native coronary artery without angina pectoris: Secondary | ICD-10-CM | POA: Insufficient documentation

## 2013-04-14 DIAGNOSIS — R197 Diarrhea, unspecified: Secondary | ICD-10-CM | POA: Insufficient documentation

## 2013-04-14 DIAGNOSIS — Z8742 Personal history of other diseases of the female genital tract: Secondary | ICD-10-CM | POA: Insufficient documentation

## 2013-04-14 DIAGNOSIS — F4310 Post-traumatic stress disorder, unspecified: Secondary | ICD-10-CM | POA: Insufficient documentation

## 2013-04-14 DIAGNOSIS — F17200 Nicotine dependence, unspecified, uncomplicated: Secondary | ICD-10-CM | POA: Insufficient documentation

## 2013-04-14 DIAGNOSIS — IMO0001 Reserved for inherently not codable concepts without codable children: Secondary | ICD-10-CM | POA: Insufficient documentation

## 2013-04-14 DIAGNOSIS — K279 Peptic ulcer, site unspecified, unspecified as acute or chronic, without hemorrhage or perforation: Secondary | ICD-10-CM

## 2013-04-14 DIAGNOSIS — Z85118 Personal history of other malignant neoplasm of bronchus and lung: Secondary | ICD-10-CM | POA: Insufficient documentation

## 2013-04-14 DIAGNOSIS — I252 Old myocardial infarction: Secondary | ICD-10-CM | POA: Insufficient documentation

## 2013-04-14 DIAGNOSIS — Z87448 Personal history of other diseases of urinary system: Secondary | ICD-10-CM | POA: Insufficient documentation

## 2013-04-14 DIAGNOSIS — Z3202 Encounter for pregnancy test, result negative: Secondary | ICD-10-CM | POA: Insufficient documentation

## 2013-04-14 LAB — COMPREHENSIVE METABOLIC PANEL
ALT: 17 U/L (ref 0–35)
AST: 20 U/L (ref 0–37)
Albumin: 3.7 g/dL (ref 3.5–5.2)
Alkaline Phosphatase: 73 U/L (ref 39–117)
CO2: 28 mEq/L (ref 19–32)
Chloride: 102 mEq/L (ref 96–112)
Creatinine, Ser: 0.81 mg/dL (ref 0.50–1.10)
GFR calc non Af Amer: 88 mL/min — ABNORMAL LOW (ref >90)
Potassium: 3.7 mEq/L (ref 3.5–5.1)
Total Bilirubin: 0.1 mg/dL — ABNORMAL LOW (ref 0.3–1.2)

## 2013-04-14 LAB — CBC WITH DIFFERENTIAL/PLATELET
Basophils Absolute: 0.1 10*3/uL (ref 0.0–0.1)
HCT: 38.3 % (ref 36.0–46.0)
Hemoglobin: 13.4 g/dL (ref 12.0–15.0)
Lymphocytes Relative: 51 % — ABNORMAL HIGH (ref 12–46)
Monocytes Absolute: 0.6 10*3/uL (ref 0.1–1.0)
Monocytes Relative: 8 % (ref 3–12)
Neutro Abs: 2.4 10*3/uL (ref 1.7–7.7)
Neutrophils Relative %: 33 % — ABNORMAL LOW (ref 43–77)
RDW: 13.4 % (ref 11.5–15.5)
WBC: 7.3 10*3/uL (ref 4.0–10.5)

## 2013-04-14 LAB — URINALYSIS, ROUTINE W REFLEX MICROSCOPIC
Glucose, UA: NEGATIVE mg/dL
Hgb urine dipstick: NEGATIVE
Ketones, ur: NEGATIVE mg/dL
Nitrite: NEGATIVE
Specific Gravity, Urine: 1.018 (ref 1.005–1.030)

## 2013-04-14 LAB — OCCULT BLOOD, POC DEVICE: Fecal Occult Bld: NEGATIVE

## 2013-04-14 LAB — POCT PREGNANCY, URINE: Preg Test, Ur: NEGATIVE

## 2013-04-14 MED ORDER — PANTOPRAZOLE SODIUM 40 MG IV SOLR
40.0000 mg | Freq: Once | INTRAVENOUS | Status: AC
  Administered 2013-04-14: 40 mg via INTRAVENOUS
  Filled 2013-04-14: qty 40

## 2013-04-14 MED ORDER — OMEPRAZOLE 20 MG PO CPDR: 20.0000 mg | DELAYED_RELEASE_CAPSULE | Freq: Every day | ORAL | Status: DC

## 2013-04-14 MED ORDER — ONDANSETRON HCL 4 MG/2ML IJ SOLN: 4.0000 mg | Freq: Once | INTRAMUSCULAR | Status: DC

## 2013-04-14 MED ORDER — GI COCKTAIL ~~LOC~~
30.0000 mL | Freq: Once | ORAL | Status: AC
  Administered 2013-04-14: 30 mL via ORAL
  Filled 2013-04-14: qty 30

## 2013-04-14 MED ORDER — ONDANSETRON 4 MG PO TBDP
8.0000 mg | ORAL_TABLET | Freq: Once | ORAL | Status: AC
  Administered 2013-04-14: 8 mg via ORAL
  Filled 2013-04-14: qty 2

## 2013-04-14 MED ORDER — HYDROMORPHONE HCL PF 1 MG/ML IJ SOLN: 1.0000 mg | Freq: Once | INTRAMUSCULAR | Status: DC

## 2013-04-14 MED ORDER — HYDROMORPHONE HCL PF 2 MG/ML IJ SOLN
2.0000 mg | Freq: Once | INTRAMUSCULAR | Status: AC
  Administered 2013-04-14: 2 mg via INTRAMUSCULAR
  Filled 2013-04-14: qty 1

## 2013-04-14 NOTE — ED Notes (Signed)
Pt reports multiple medical issues. Pt reports ongoing  All over body pain and diarrhea.

## 2013-04-14 NOTE — ED Provider Notes (Signed)
CSN: [id number]     Arrival date & time 04/14/13  0802 History   First MD Initiated Contact with Patient 04/14/13 [phone]     Chief Complaint  Patient presents with  . Abdominal Pain   (Consider location/radiation/quality/duration/timing/severity/associated sxs/prior Treatment) HPI Comments: Patient presents emergency department with chief complaint of abdominal pain. Patient states that she has chronic pain. She also states that she has a history of peptic ulcer disease. She states that she thinks her ulcers have been flaring up. She endorses pain over her stomach, as well as nausea. She states that she has also noticed a little bit of blood in her stool, and that she has had some acidic diarrhea. He is tried taking protonix and Phenergan with some relief. She states that she does not have a primary care provider. She states that she is seen for her chronic pain at a pain clinic. She was recently given Suboxone, but states that she has quit taking them because she does not like the way it makes her feel.  The history is provided by the patient. No language interpreter was used.    Past Medical History  Diagnosis Date  . Bipolar 1 disorder   . PTSD (post-traumatic stress disorder)   . Fibromyalgia   . Mass of left breast   . Arthritis   . Coronary artery disease   . Myocardial infarct   . Tumors   . Stomach ulcer   . Lung cancer    Past Surgical History  Procedure Laterality Date  . Abdominal hysterectomy    . Breast surgery     Family History  Problem Relation Age of Onset  . Cancer Other   . Diabetes Other    History  Substance Use Topics  . Smoking status: Current Every Day Smoker -- 1.00 packs/day    Types: Cigarettes  . Smokeless tobacco: Not on file  . Alcohol Use: No   OB History   Grav Para Term Preterm Abortions TAB SAB Ect Mult Living   2 1 1  1  1   1      Review of Systems  All other systems reviewed and are negative.    Allergies  Aspirin; Haldol;  Imitrex; Nsaids; Sulfa antibiotics; Toradol; Tylenol; and Ultram  Home Medications   Current Outpatient Rx  Name  Route  Sig  Dispense  Refill  . albuterol-ipratropium (COMBIVENT) 18-103 MCG/ACT inhaler   Inhalation   Inhale 2 puffs into the lungs every 6 (six) hours as needed for wheezing or shortness of breath.          . ALPRAZolam (XANAX) 1 MG tablet   Oral   Take 1 tablet (1 mg total) by mouth 3 (three) times daily as needed for anxiety.   12 tablet   0   . Buprenorphine HCl-Naloxone HCl (SUBOXONE) 8-2 MG FILM   Sublingual   Place 1 Film under the tongue 2 (two) times daily.         Marland Kitchen desloratadine (CLARINEX) 5 MG tablet   Oral   Take 1 tablet (5 mg total) by mouth daily.   30 tablet   0   . estradiol (ESTRACE) 1 MG tablet   Oral   Take 1 tablet (1 mg total) by mouth daily.   12 tablet   0   . gabapentin (NEURONTIN) 600 MG tablet   Oral   Take 1,200 mg by mouth 2 (two) times daily.         Marland Kitchen lisinopril (  PRINIVIL,ZESTRIL) 10 MG tablet   Oral   Take 1 tablet (10 mg total) by mouth daily.   12 tablet   0   . Multiple Vitamin (MULTIVITAMIN WITH MINERALS) TABS   Oral   Take 1 tablet by mouth daily.         Marland Kitchen omeprazole (PRILOSEC) 20 MG capsule   Oral   Take 20 mg by mouth daily.         . Oxycodone HCl 20 MG TABS   Oral   Take 20 mg by mouth once as needed. For pain         . pantoprazole (PROTONIX) 20 MG tablet   Oral   Take 2 tablets (40 mg total) by mouth daily.   15 tablet   0   . promethazine (PHENERGAN) 25 MG tablet   Oral   Take 1 tablet (25 mg total) by mouth every 6 (six) hours as needed for nausea.   15 tablet   0   . QUEtiapine (SEROQUEL) 300 MG tablet   Oral   Take 300 mg by mouth at bedtime.         . traZODone (DESYREL) 50 MG tablet   Oral   Take 50 mg by mouth at bedtime as needed for sleep.          There were no vitals taken for this visit. Physical Exam  Nursing note and vitals reviewed. Constitutional:  She is oriented to person, place, and time. She appears well-developed and well-nourished.  HENT:  Head: Normocephalic and atraumatic.  Eyes: Conjunctivae and EOM are normal. Pupils are equal, round, and reactive to light.  Neck: Normal range of motion. Neck supple.  Cardiovascular: Normal rate and regular rhythm.  Exam reveals no gallop and no friction rub.   No murmur heard. Pulmonary/Chest: Effort normal and breath sounds normal. No respiratory distress. She has no wheezes. She has no rales. She exhibits no tenderness.  Abdominal: Soft. Bowel sounds are normal. She exhibits no distension and no mass. There is no tenderness. There is no rebound and no guarding.  Mildly tender to the upper left quadrant, no other focal abdominal tenderness, no signs of fluid, peritonitis, or acute abdomen  Musculoskeletal: Normal range of motion. She exhibits no edema and no tenderness.  Neurological: She is alert and oriented to person, place, and time.  Skin: Skin is warm and dry.  Psychiatric: She has a normal mood and affect. Her behavior is normal. Judgment and thought content normal.    ED Course  Procedures (including critical care time) Labs Review Labs Reviewed  CBC WITH DIFFERENTIAL  COMPREHENSIVE METABOLIC PANEL  LIPASE, BLOOD  OCCULT BLOOD X 1 CARD TO LAB, STOOL  URINALYSIS, ROUTINE W REFLEX MICROSCOPIC  ED ECG REPORT  I personally interpreted this EKG   Date: 04/14/2013   Rate: 65  Rhythm: normal sinus rhythm  QRS Axis: normal  Intervals: normal  ST/T Wave abnormalities: normal  Conduction Disutrbances:none  Narrative Interpretation:   Old EKG Reviewed: unchanged   Results for orders placed during the hospital encounter of 04/14/13  CBC WITH DIFFERENTIAL      Result Value Range   WBC 7.3  4.0 - 10.5 K/uL   RBC 4.21  3.87 - 5.11 MIL/uL   Hemoglobin 13.4  12.0 - 15.0 g/dL   HCT 16.1  09.6 - 04.5 %   MCV 91.0  78.0 - 100.0 fL   MCH 31.8  26.0 - 34.0 pg   MCHC 35.0  30.0 - 36.0  g/dL   RDW 96.2  95.2 - 84.1 %   Platelets 338  150 - 400 K/uL   Neutrophils Relative % 33 (*) 43 - 77 %   Neutro Abs 2.4  1.7 - 7.7 K/uL   Lymphocytes Relative 51 (*) 12 - 46 %   Lymphs Abs 3.7  0.7 - 4.0 K/uL   Monocytes Relative 8  3 - 12 %   Monocytes Absolute 0.6  0.1 - 1.0 K/uL   Eosinophils Relative 7 (*) 0 - 5 %   Eosinophils Absolute 0.5  0.0 - 0.7 K/uL   Basophils Relative 2 (*) 0 - 1 %   Basophils Absolute 0.1  0.0 - 0.1 K/uL  COMPREHENSIVE METABOLIC PANEL      Result Value Range   Sodium 138  135 - 145 mEq/L   Potassium 3.7  3.5 - 5.1 mEq/L   Chloride 102  96 - 112 mEq/L   CO2 28  19 - 32 mEq/L   Glucose, Bld 70  70 - 99 mg/dL   BUN 9  6 - 23 mg/dL   Creatinine, Ser 3.24  0.50 - 1.10 mg/dL   Calcium 9.6  8.4 - 40.1 mg/dL   Total Protein 6.6  6.0 - 8.3 g/dL   Albumin 3.7  3.5 - 5.2 g/dL   AST 20  0 - 37 U/L   ALT 17  0 - 35 U/L   Alkaline Phosphatase 73  39 - 117 U/L   Total Bilirubin 0.1 (*) 0.3 - 1.2 mg/dL   GFR calc non Af Amer 88 (*) >90 mL/min   GFR calc Af Amer >90  >90 mL/min  LIPASE, BLOOD      Result Value Range   Lipase 20  11 - 59 U/L  URINALYSIS, ROUTINE W REFLEX MICROSCOPIC      Result Value Range   Color, Urine YELLOW  YELLOW   APPearance CLEAR  CLEAR   Specific Gravity, Urine 1.018  1.005 - 1.030   pH 5.5  5.0 - 8.0   Glucose, UA NEGATIVE  NEGATIVE mg/dL   Hgb urine dipstick NEGATIVE  NEGATIVE   Bilirubin Urine NEGATIVE  NEGATIVE   Ketones, ur NEGATIVE  NEGATIVE mg/dL   Protein, ur NEGATIVE  NEGATIVE mg/dL   Urobilinogen, UA 0.2  0.0 - 1.0 mg/dL   Nitrite NEGATIVE  NEGATIVE   Leukocytes, UA NEGATIVE  NEGATIVE  POCT PREGNANCY, URINE      Result Value Range   Preg Test, Ur NEGATIVE  NEGATIVE  OCCULT BLOOD, POC DEVICE      Result Value Range   Fecal Occult Bld NEGATIVE  NEGATIVE      MDM   1. PUD (peptic ulcer disease)   2. Chronic pain    Patient with chronic pain, and peptic ulcer disease. Will treat with Protonix and Zofran.  Will also give fluids, and it does a lot of. Will check basic labs, and compare them to the prior labs. Suspect this is the patient's chronic pain. Anticipate discharge with GI followup.  11:14 AM Patient states that she feels all better. Pain was 10 out of 10, now is 3/10. Longer feeling nauseated. States that she still feels gassy. Will discharge with GI followup. Recommend followup with consult and well-nourished. She states that she has appointment scheduled for in 2 weeks.  Patient discussed with Dr. Loretha Stapler, who agrees with plan.    Roxy Horseman, PA-C 04/14/13 1115

## 2013-04-14 NOTE — ED Notes (Signed)
EKG delayed due to equipment malfunction 

## 2013-04-16 NOTE — ED Provider Notes (Signed)
Medical screening examination/treatment/procedure(s) were performed by non-physician practitioner and as supervising physician I was immediately available for consultation/collaboration.    Candyce Churn, MD 04/16/13 1005

## 2013-04-26 ENCOUNTER — Encounter (HOSPITAL_COMMUNITY): Payer: Self-pay | Admitting: Emergency Medicine

## 2013-04-26 ENCOUNTER — Emergency Department (HOSPITAL_COMMUNITY): Payer: Medicaid Other

## 2013-04-26 ENCOUNTER — Inpatient Hospital Stay (HOSPITAL_COMMUNITY): Admission: RE | Admit: 2013-04-26 | Payer: Self-pay | Source: Home / Self Care | Admitting: Psychiatry

## 2013-04-26 ENCOUNTER — Emergency Department (HOSPITAL_COMMUNITY)
Admission: EM | Admit: 2013-04-26 | Discharge: 2013-04-26 | Disposition: A | Discharge status: Home / Self Care | Payer: Medicaid Other | Source: Home / Self Care | Attending: Emergency Medicine | Admitting: Emergency Medicine

## 2013-04-26 ENCOUNTER — Emergency Department (HOSPITAL_COMMUNITY)
Admission: EM | Admit: 2013-04-26 | Discharge: 2013-04-26 | Disposition: A | Discharge status: Home / Self Care | Payer: Medicaid Other | Attending: Emergency Medicine | Admitting: Emergency Medicine

## 2013-04-26 DIAGNOSIS — R109 Unspecified abdominal pain: Secondary | ICD-10-CM | POA: Insufficient documentation

## 2013-04-26 DIAGNOSIS — R112 Nausea with vomiting, unspecified: Secondary | ICD-10-CM

## 2013-04-26 DIAGNOSIS — Z8739 Personal history of other diseases of the musculoskeletal system and connective tissue: Secondary | ICD-10-CM | POA: Insufficient documentation

## 2013-04-26 DIAGNOSIS — I2510 Atherosclerotic heart disease of native coronary artery without angina pectoris: Secondary | ICD-10-CM | POA: Insufficient documentation

## 2013-04-26 DIAGNOSIS — F4310 Post-traumatic stress disorder, unspecified: Secondary | ICD-10-CM | POA: Insufficient documentation

## 2013-04-26 DIAGNOSIS — R141 Gas pain: Secondary | ICD-10-CM | POA: Insufficient documentation

## 2013-04-26 DIAGNOSIS — F319 Bipolar disorder, unspecified: Secondary | ICD-10-CM | POA: Insufficient documentation

## 2013-04-26 DIAGNOSIS — Z85118 Personal history of other malignant neoplasm of bronchus and lung: Secondary | ICD-10-CM | POA: Insufficient documentation

## 2013-04-26 DIAGNOSIS — IMO0001 Reserved for inherently not codable concepts without codable children: Secondary | ICD-10-CM | POA: Insufficient documentation

## 2013-04-26 DIAGNOSIS — IMO0002 Reserved for concepts with insufficient information to code with codable children: Secondary | ICD-10-CM | POA: Insufficient documentation

## 2013-04-26 DIAGNOSIS — M129 Arthropathy, unspecified: Secondary | ICD-10-CM | POA: Insufficient documentation

## 2013-04-26 DIAGNOSIS — I252 Old myocardial infarction: Secondary | ICD-10-CM | POA: Insufficient documentation

## 2013-04-26 DIAGNOSIS — Z8719 Personal history of other diseases of the digestive system: Secondary | ICD-10-CM | POA: Insufficient documentation

## 2013-04-26 DIAGNOSIS — Z8742 Personal history of other diseases of the female genital tract: Secondary | ICD-10-CM | POA: Insufficient documentation

## 2013-04-26 DIAGNOSIS — Z79899 Other long term (current) drug therapy: Secondary | ICD-10-CM | POA: Insufficient documentation

## 2013-04-26 DIAGNOSIS — Z860100 Personal history of colon polyps, unspecified: Secondary | ICD-10-CM | POA: Insufficient documentation

## 2013-04-26 DIAGNOSIS — G8929 Other chronic pain: Secondary | ICD-10-CM

## 2013-04-26 DIAGNOSIS — K920 Hematemesis: Secondary | ICD-10-CM | POA: Insufficient documentation

## 2013-04-26 DIAGNOSIS — F17200 Nicotine dependence, unspecified, uncomplicated: Secondary | ICD-10-CM | POA: Insufficient documentation

## 2013-04-26 DIAGNOSIS — R142 Eructation: Secondary | ICD-10-CM | POA: Insufficient documentation

## 2013-04-26 DIAGNOSIS — Z3202 Encounter for pregnancy test, result negative: Secondary | ICD-10-CM | POA: Insufficient documentation

## 2013-04-26 DIAGNOSIS — R110 Nausea: Secondary | ICD-10-CM | POA: Insufficient documentation

## 2013-04-26 DIAGNOSIS — K259 Gastric ulcer, unspecified as acute or chronic, without hemorrhage or perforation: Secondary | ICD-10-CM | POA: Insufficient documentation

## 2013-04-26 DIAGNOSIS — R630 Anorexia: Secondary | ICD-10-CM | POA: Insufficient documentation

## 2013-04-26 DIAGNOSIS — C3490 Malignant neoplasm of unspecified part of unspecified bronchus or lung: Secondary | ICD-10-CM | POA: Insufficient documentation

## 2013-04-26 DIAGNOSIS — F911 Conduct disorder, childhood-onset type: Secondary | ICD-10-CM | POA: Insufficient documentation

## 2013-04-26 LAB — CBC WITH DIFFERENTIAL/PLATELET
Basophils Absolute: 0.1 10*3/uL (ref 0.0–0.1)
Eosinophils Relative: 7 % — ABNORMAL HIGH (ref 0–5)
HCT: 33.9 % — ABNORMAL LOW (ref 36.0–46.0)
Lymphocytes Relative: 33 % (ref 12–46)
MCHC: 33.9 g/dL (ref 30.0–36.0)
MCV: 92.6 fL (ref 78.0–100.0)
Monocytes Absolute: 1 10*3/uL (ref 0.1–1.0)
Neutro Abs: 6.2 10*3/uL (ref 1.7–7.7)
RDW: 13.4 % (ref 11.5–15.5)
WBC: 12.3 10*3/uL — ABNORMAL HIGH (ref 4.0–10.5)

## 2013-04-26 LAB — COMPREHENSIVE METABOLIC PANEL
ALT: 16 U/L (ref 0–35)
AST: 17 U/L (ref 0–37)
AST: 15 U/L (ref 0–37)
Albumin: 3.4 g/dL — ABNORMAL LOW (ref 3.5–5.2)
Albumin: 3.8 g/dL (ref 3.5–5.2)
Alkaline Phosphatase: 71 U/L (ref 39–117)
BUN: 7 mg/dL (ref 6–23)
CO2: 25 mEq/L (ref 19–32)
Calcium: 9.7 mg/dL (ref 8.4–10.5)
Chloride: 103 mEq/L (ref 96–112)
Chloride: 101 mEq/L (ref 96–112)
Creatinine, Ser: 0.8 mg/dL (ref 0.50–1.10)
GFR calc Af Amer: >90 mL/min (ref >90)
GFR calc non Af Amer: 83 mL/min — ABNORMAL LOW (ref >90)
Potassium: 3.4 mEq/L — ABNORMAL LOW (ref 3.5–5.1)
Sodium: 138 mEq/L (ref 135–145)
Sodium: 137 mEq/L (ref 135–145)
Total Bilirubin: 0.1 mg/dL — ABNORMAL LOW (ref 0.3–1.2)
Total Bilirubin: 0.1 mg/dL — ABNORMAL LOW (ref 0.3–1.2)

## 2013-04-26 LAB — RAPID URINE DRUG SCREEN, HOSP PERFORMED
Amphetamines: NOT DETECTED
Benzodiazepines: POSITIVE — AB
Cocaine: NOT DETECTED
Opiates: NOT DETECTED
Tetrahydrocannabinol: NOT DETECTED

## 2013-04-26 LAB — CBC
Hemoglobin: 11.9 g/dL — ABNORMAL LOW (ref 12.0–15.0)
MCV: 92.5 fL (ref 78.0–100.0)
Platelets: 332 10*3/uL (ref 150–400)
RBC: 3.88 MIL/uL (ref 3.87–5.11)
RDW: 13.6 % (ref 11.5–15.5)
WBC: 10 10*3/uL (ref 4.0–10.5)

## 2013-04-26 LAB — URINE MICROSCOPIC-ADD ON

## 2013-04-26 LAB — URINALYSIS, ROUTINE W REFLEX MICROSCOPIC
Bilirubin Urine: NEGATIVE
Ketones, ur: NEGATIVE mg/dL
Nitrite: NEGATIVE
Specific Gravity, Urine: 1.01 (ref 1.005–1.030)
pH: 6 (ref 5.0–8.0)

## 2013-04-26 LAB — ETHANOL: Alcohol, Ethyl (B): <11 mg/dL (ref 0–11)

## 2013-04-26 LAB — OCCULT BLOOD, POC DEVICE: Fecal Occult Bld: NEGATIVE

## 2013-04-26 MED ORDER — PANTOPRAZOLE SODIUM 40 MG IV SOLR
40.0000 mg | Freq: Once | INTRAVENOUS | Status: AC
  Administered 2013-04-26: 40 mg via INTRAVENOUS
  Filled 2013-04-26: qty 40

## 2013-04-26 MED ORDER — DICYCLOMINE HCL 20 MG PO TABS: 20.0000 mg | ORAL_TABLET | Freq: Two times a day (BID) | ORAL | Status: DC

## 2013-04-26 MED ORDER — HYDROMORPHONE HCL PF 1 MG/ML IJ SOLN
1.0000 mg | Freq: Once | INTRAMUSCULAR | Status: AC
  Administered 2013-04-26: 1 mg via INTRAVENOUS
  Filled 2013-04-26: qty 1

## 2013-04-26 MED ORDER — ONDANSETRON 4 MG PO TBDP
4.0000 mg | ORAL_TABLET | Freq: Once | ORAL | Status: AC
  Administered 2013-04-26: 4 mg via ORAL
  Filled 2013-04-26: qty 1

## 2013-04-26 MED ORDER — PROMETHAZINE HCL 25 MG PO TABS: 25.0000 mg | ORAL_TABLET | Freq: Four times a day (QID) | ORAL | Status: DC | PRN

## 2013-04-26 MED ORDER — ONDANSETRON HCL 4 MG/2ML IJ SOLN
4.0000 mg | Freq: Once | INTRAMUSCULAR | Status: AC
  Administered 2013-04-26: 4 mg via INTRAVENOUS
  Filled 2013-04-26: qty 2

## 2013-04-26 NOTE — ED Notes (Signed)
Pt states that she wants to be voluntarily committed.  States that she is SI without a plan.  No HI.  Has been seen twice today at [facility] and [facility] for abd pain.

## 2013-04-26 NOTE — ED Provider Notes (Signed)
Medical screening examination/treatment/procedure(s) were performed by non-physician practitioner and as supervising physician I was immediately available for consultation/collaboration.   Audree Camel, MD 04/26/13 [phone]

## 2013-04-26 NOTE — ED Provider Notes (Signed)
CSN: [id number]     Arrival date & time 04/26/13  7829 History   First MD Initiated Contact with Patient 04/26/13 [phone]     Chief Complaint  Patient presents with  . Abdominal Pain  . Illegal value: [    vomit blood   (Consider location/radiation/quality/duration/timing/severity/associated sxs/prior Treatment) Patient is a 42 y.o. female presenting with abdominal pain. The history is provided by the patient. No language interpreter was used.  Abdominal Pain Pain location:  Generalized Pain quality: aching and bloating   Pain radiates to:  Does not radiate Pain severity:  Severe Onset quality:  Unable to specify Duration: 1.5 years. Timing:  Constant Progression:  Waxing and waning Chronicity:  Chronic Relieved by:  Nothing Exacerbated by: eating. Ineffective treatments:  OTC medications Associated symptoms: anorexia, belching, hematemesis, melena (intermittently for 2 weeks.  Last episode 3am.), nausea and vomiting   Associated symptoms: no chest pain, no chills, no cough, no diarrhea, no dysuria, no fatigue, no fever, no shortness of breath and no sore throat   Vomiting:    Quality:  Bright red blood   Number of occurrences:  Once today, but has had intermittent hematemesis for 2 weeks   Severity:  Moderate   Duration:  2 weeks   Timing:  Intermittent   Progression:  Unchanged   Past Medical History  Diagnosis Date  . Bipolar 1 disorder   . PTSD (post-traumatic stress disorder)   . Fibromyalgia   . Mass of left breast   . Arthritis   . Coronary artery disease   . Myocardial infarct   . Tumors   . Stomach ulcer   . Lung cancer    Past Surgical History  Procedure Laterality Date  . Abdominal hysterectomy    . Breast surgery     Family History  Problem Relation Age of Onset  . Cancer Other   . Diabetes Other    History  Substance Use Topics  . Smoking status: Current Every Day Smoker -- 0.50 packs/day    Types: Cigarettes  . Smokeless tobacco: Not on file   . Alcohol Use: No   OB History   Grav Para Term Preterm Abortions TAB SAB Ect Mult Living   2 1 1  1  1   1      Review of Systems  Constitutional: Negative for fever, chills, diaphoresis, activity change, appetite change and fatigue.  HENT: Negative for congestion, sore throat, facial swelling, rhinorrhea, neck pain and neck stiffness.   Eyes: Negative for photophobia and discharge.  Respiratory: Negative for cough, chest tightness and shortness of breath.   Cardiovascular: Negative for chest pain, palpitations and leg swelling.  Gastrointestinal: Positive for nausea, vomiting, abdominal pain, blood in stool, melena (intermittently for 2 weeks.  Last episode 3am.), anorexia and hematemesis. Negative for diarrhea.  Endocrine: Negative for polydipsia and polyuria.  Genitourinary: Negative for dysuria, frequency, difficulty urinating and pelvic pain.  Musculoskeletal: Negative for back pain and arthralgias.  Skin: Negative for color change and wound.  Allergic/Immunologic: Negative for immunocompromised state.  Neurological: Negative for facial asymmetry, weakness, numbness and headaches.  Hematological: Does not bruise/bleed easily.  Psychiatric/Behavioral: Negative for confusion and agitation.    Allergies  Aspirin; Ranitidine; Haldol; Imitrex; Nsaids; Suboxone; Sulfa antibiotics; Toradol; Tylenol; and Ultram  Home Medications   Current Outpatient Rx  Name  Route  Sig  Dispense  Refill  . albuterol-ipratropium (COMBIVENT) 18-103 MCG/ACT inhaler   Inhalation   Inhale 2 puffs into the lungs every 6 (  six) hours as needed for wheezing or shortness of breath.          . ALPRAZolam (XANAX) 1 MG tablet   Oral   Take 1 tablet (1 mg total) by mouth 3 (three) times daily as needed for anxiety.   12 tablet   0   . desloratadine (CLARINEX) 5 MG tablet   Oral   Take 1 tablet (5 mg total) by mouth daily.   30 tablet   0   . estradiol (ESTRACE) 1 MG tablet   Oral   Take 1 tablet  (1 mg total) by mouth daily.   12 tablet   0   . gabapentin (NEURONTIN) 600 MG tablet   Oral   Take 1,200 mg by mouth 2 (two) times daily.         Marland Kitchen lisinopril (PRINIVIL,ZESTRIL) 10 MG tablet   Oral   Take 1 tablet (10 mg total) by mouth daily.   12 tablet   0   . omeprazole (PRILOSEC) 20 MG capsule   Oral   Take 20 mg by mouth 2 (two) times daily.         . ondansetron (ZOFRAN-ODT) 4 MG disintegrating tablet   Oral   Take 4 mg by mouth every 8 (eight) hours as needed for nausea.         . pantoprazole (PROTONIX) 20 MG tablet   Oral   Take 2 tablets (40 mg total) by mouth daily.   15 tablet   0   . promethazine (PHENERGAN) 25 MG tablet   Oral   Take 1 tablet (25 mg total) by mouth every 6 (six) hours as needed for nausea.   15 tablet   0   . QUEtiapine (SEROQUEL) 300 MG tablet   Oral   Take 300 mg by mouth at bedtime.         . triamcinolone (NASACORT) 55 MCG/ACT nasal inhaler   Nasal   Place 2 sprays into the nose daily as needed (Allergies).         Marland Kitchen dicyclomine (BENTYL) 20 MG tablet   Oral   Take 1 tablet (20 mg total) by mouth 2 (two) times daily.   20 tablet   0    BP 107/69  Pulse 80  Temp(Src) 98 F (36.7 C) (Oral)  Resp 16  SpO2 100% Physical Exam  Constitutional: She is oriented to person, place, and time. She appears well-developed and well-nourished. No distress.  HENT:  Head: Normocephalic and atraumatic.  Mouth/Throat: No oropharyngeal exudate.  Eyes: Pupils are equal, round, and reactive to light.  Neck: Normal range of motion. Neck supple.  Cardiovascular: Normal rate, regular rhythm and normal heart sounds.  Exam reveals no gallop and no friction rub.   No murmur heard. Pulmonary/Chest: Effort normal and breath sounds normal. No respiratory distress. She has no wheezes. She has no rales.  Abdominal: Soft. Bowel sounds are normal. She exhibits distension. She exhibits no mass. There is no tenderness. There is no rebound and no  guarding.  Does not appear to have abdominal pain w/ distraction  Musculoskeletal: Normal range of motion. She exhibits no edema and no tenderness.  Neurological: She is alert and oriented to person, place, and time.  Skin: Skin is warm and dry.  Psychiatric: She has a normal mood and affect.    ED Course  Procedures (including critical care time) Labs Review Labs Reviewed  OCCULT BLOOD, POC DEVICE   Imaging Review Dg Abd Acute W/chest  04/26/2013   CLINICAL DATA:  Right upper quadrant pain  EXAM: ACUTE ABDOMEN SERIES (ABDOMEN 2 VIEW & CHEST 1 VIEW)  COMPARISON:  PET-CT 06/26/2011  FINDINGS: Large volume of stool throughout the colon. No evidence of obstruction. No abnormal intra-abdominal mass effect or pathologic calcification. Lungs are clear and well aerated. No effusion or pneumothorax. Normal heart size. Unchanged [facility] window lymphadenopathy. No acute osseous findings.  IMPRESSION: 1. Large stool volume without obstruction. 2. No evidence of acute cardiopulmonary disease.   Electronically Signed   By: Tiburcio Pea M.D.   On: 04/26/2013 04:21    MDM   1. Chronic abdominal pain   2. Nausea & vomiting    Pt is a 42 y.o. female with Pmhx as above including chronic ab pain, gastric ulcers who presents with chronic upper abdominal pain for about 1.5 years, worse past 1 week, with report of hematemesis & melena intermittently for about 2 weeks (though timing inconsistent with repeat questioning). Pt seen several hrs ago at [facility], denied vomiting according to notes and also did not report being seen. Reports last episode of emesis around 5am with small clumps of blood, and has a bloody stool around 3am was in [facility] ED from [phone] with neither reported).  Pt then later reported she only has BMs after an enema.  VSS, pt in NAD on PE, does not appears to have any abdominal pain on exam w/ distraction. Hard stool in rectal vault, no gross blood.  Pt at first states she has been taking suboxone which  she feels makes symptoms worse, then says she has been out since Oct 1st. Labs, UA, AAS reviewed from several hours ago at [facility].  Hb stable. UA nml. CMP unremarkable.  Have ordered NG lavage, heme stool card.  Do not feel repeat labs needed.  Abdominal exam non-surgical and do not feel additional imaging. Needed.  Have ordered dose IV protonix, zofran, dilaudid, but will not be providing further doses or narcotic rx.   Presentation suspicious for drug seeking behavior.    11:42 AM Pt refusing further attempts of NG placement after 2 negative attempts. Heme stool negative.   11:53 AM Pt states she feels unchanged.  She is well appearing on exam, drinking Mtn Dew. Heme stool negative. No hematemesis in dept.  She has GI f/u on 10/10 and 10/14 and I believe she is safe for d/c.  Have recommended she continue prilosec, phenergan and can try bentyl.  Return precautions given for new or worsening symptoms including large hematemesis, large blood in stool, fever.        Shanna Cisco, MD 04/26/13 1355

## 2013-04-26 NOTE — ED Notes (Signed)
Social worker at bedside. Pt requested to talk to social worker about living situation.

## 2013-04-26 NOTE — Progress Notes (Signed)
Case Manager - consult .Met patient in waiting room of ED patient reports she came to ED - with increased abdominal pain secondary to Cancer.Patient reports the ED Doctor would not  Admit her today.This Clinical research associate explained that before a patient is admitted they need to meet a certain criteria.I explained to patient this Clinical research associate and also our Child psychotherapist would help with her Discharge planning and address her concerns.Patient reports her Pain clinic appointment is next Thursday and she needs pain medications.This Clinical research associate reviewed the EPIC note and explained To the patient because her Pain control Issues were managed by the Pain Clinic she would need to follow up with them.This Writer suggested to the Patient that she call the Pain Clinic first  Thing on Monday morning to see if she could be worked in - in advance of her Thursday appointment.This Writer suggested the  Patient call her PCP next week for follow up from her ED visit  Today.Patient has Medicaid Lexicographer suggested the patient call the number on her card to get help with a Environmental manager. Teach Method used to ensure patient  Understands Her plan of care / Discharge Instructions.

## 2013-04-26 NOTE — ED Notes (Addendum)
Pt reports to PA that she has breast and liver cancer. Pt is screaming at PA. [employer] at beside now.  Pt is now cursing at staff.

## 2013-04-26 NOTE — BH Assessment (Signed)
[facility] Assessment Progress Note   This clinician talked to patient at [facility] as a walk-in patient.  Time was 19:10.  Patient was upset about the PA that discharged her.  When asked who she did not know the name and said it was over there (indicating [facility]).  Patient did confirm that she had an appointment with [facility] on Thursday, 10/09.  Patient did talk about being depressed about recent death of mother on April 21, 2023.  Patient however did deny SI when asked.  Patient began talking about her suboxone prescription and the amount of pain that she was in.  Clinician did tell her that she would not receive narcotics while on the unit if she were admitted.  Patient then said "well I just wasted 1.5 hours.  If I had known that I wouldn't have wasted my time."  She then got up and asked to get her things.  She did sign a declination of MSE form after one was offered.  Patient was in a hurry to get going so that she could get a ride.

## 2013-04-26 NOTE — ED Notes (Signed)
Pt left department to go outside, smoke and get a [facility].  Reinforced with pt again what was previously explained, that once test results are received then we could get her the requested food and drink.

## 2013-04-26 NOTE — ED Provider Notes (Addendum)
CSN: [id number]     Arrival date & time 04/26/13  0144 History   First MD Initiated Contact with Patient 04/26/13 0248     Chief Complaint  Patient presents with  . Abdominal Pain   (Consider location/radiation/quality/duration/timing/severity/associated sxs/prior Treatment) Patient is a 42 y.o. female presenting with abdominal pain. The history is provided by the patient and the EMS personnel.  Abdominal Pain Associated symptoms: nausea   Associated symptoms: no chest pain, no dysuria, no fever, no shortness of breath and no vomiting    patient with a history of chronic abdominal pain. Patient recently evaluated in emergency apartment on September 19 and September 22 for the same complaint. Patient is currently managed by pain management. Patient does not like the medications they have her on patient is requesting her chronic pain medication. Patient had recent referral to GI medicine for upper endoscopy. Patient states the pain is 10 out of 10 that her abdomen is distended pain in the abdomen is in the upper quadrants. This is similar to her previous visits. She's also been treated for peptic ulcer disease clinically without confirm diagnosis. Patient states that her whole body hurts. Associated with some nausea no vomiting no diarrhea. No fever. Patient's previous visits from September 19 September 22 reviewed.  Past Medical History  Diagnosis Date  . Bipolar 1 disorder   . PTSD (post-traumatic stress disorder)   . Fibromyalgia   . Mass of left breast   . Arthritis   . Coronary artery disease   . Myocardial infarct   . Tumors   . Stomach ulcer   . Lung cancer    Past Surgical History  Procedure Laterality Date  . Abdominal hysterectomy    . Breast surgery     Family History  Problem Relation Age of Onset  . Cancer Other   . Diabetes Other    History  Substance Use Topics  . Smoking status: Current Every Day Smoker -- 1.00 packs/day    Types: Cigarettes  . Smokeless  tobacco: Not on file  . Alcohol Use: No   OB History   Grav Para Term Preterm Abortions TAB SAB Ect Mult Living   2 1 1  1  1   1      Review of Systems  Constitutional: Negative for fever.  HENT: Negative for congestion.   Eyes: Negative for redness.  Respiratory: Negative for shortness of breath.   Cardiovascular: Negative for chest pain.  Gastrointestinal: Positive for nausea and abdominal pain. Negative for vomiting.  Genitourinary: Negative for dysuria.  Musculoskeletal: Positive for myalgias.  Skin: Negative for rash.  Neurological: Negative for headaches.  Hematological: Does not bruise/bleed easily.  Psychiatric/Behavioral: Negative for confusion.    Allergies  Aspirin; Haldol; Imitrex; Nsaids; Sulfa antibiotics; Toradol; Tylenol; and Ultram  Home Medications   Current Outpatient Rx  Name  Route  Sig  Dispense  Refill  . albuterol-ipratropium (COMBIVENT) 18-103 MCG/ACT inhaler   Inhalation   Inhale 2 puffs into the lungs every 6 (six) hours as needed for wheezing or shortness of breath.          . ALPRAZolam (XANAX) 1 MG tablet   Oral   Take 1 tablet (1 mg total) by mouth 3 (three) times daily as needed for anxiety.   12 tablet   0   . buprenorphine-naloxone (SUBOXONE) 8-2 MG SUBL SL tablet   Sublingual   Place 1 tablet under the tongue 2 (two) times daily.         Marland Kitchen  desloratadine (CLARINEX) 5 MG tablet   Oral   Take 1 tablet (5 mg total) by mouth daily.   30 tablet   0   . estradiol (ESTRACE) 1 MG tablet   Oral   Take 1 tablet (1 mg total) by mouth daily.   12 tablet   0   . gabapentin (NEURONTIN) 600 MG tablet   Oral   Take 1,200 mg by mouth 2 (two) times daily.         Marland Kitchen lisinopril (PRINIVIL,ZESTRIL) 10 MG tablet   Oral   Take 1 tablet (10 mg total) by mouth daily.   12 tablet   0   . omeprazole (PRILOSEC) 20 MG capsule   Oral   Take 1 capsule (20 mg total) by mouth daily.   30 capsule   0   . pantoprazole (PROTONIX) 20 MG  tablet   Oral   Take 2 tablets (40 mg total) by mouth daily.   15 tablet   0   . promethazine (PHENERGAN) 25 MG tablet   Oral   Take 1 tablet (25 mg total) by mouth every 6 (six) hours as needed for nausea.   15 tablet   0   . QUEtiapine (SEROQUEL) 300 MG tablet   Oral   Take 300 mg by mouth at bedtime.         . promethazine (PHENERGAN) 25 MG tablet   Oral   Take 1 tablet (25 mg total) by mouth every 6 (six) hours as needed for nausea.   12 tablet   0    BP 111/83  Pulse 89  Temp(Src) 98.6 F (37 C) (Oral)  Resp 20  Ht 5' 3.5" (1.613 m)  Wt 130 lb (58.968 kg)  BMI 22.66 kg/m2  SpO2 99% Physical Exam  Nursing note and vitals reviewed. Constitutional: She is oriented to person, place, and time. She appears well-developed and well-nourished. No distress.  HENT:  Head: Normocephalic and atraumatic.  Mouth/Throat: Oropharynx is clear and moist.  Eyes: Conjunctivae and EOM are normal. Pupils are equal, round, and reactive to light.  Neck: Normal range of motion.  Cardiovascular: Normal rate, regular rhythm and normal heart sounds.   No murmur heard. Pulmonary/Chest: Effort normal and breath sounds normal. No respiratory distress.  Abdominal: Soft. Bowel sounds are normal. She exhibits distension. She exhibits no mass. There is no tenderness. There is no guarding.  Mild distention  Neurological: She is alert and oriented to person, place, and time. No cranial nerve deficit. She exhibits normal muscle tone. Coordination normal.  Skin: Skin is warm. No rash noted.    ED Course  Procedures (including critical care time) Labs Review Labs Reviewed  COMPREHENSIVE METABOLIC PANEL - Abnormal; Notable for the following:    Potassium 3.4 (*)    Glucose, Bld 100 (*)    Albumin 3.4 (*)    Total Bilirubin 0.1 (*)    GFR calc non Af Amer 83 (*)    All other components within normal limits  CBC WITH DIFFERENTIAL - Abnormal; Notable for the following:    WBC 12.3 (*)    RBC  3.66 (*)    Hemoglobin 11.5 (*)    HCT 33.9 (*)    Lymphs Abs 4.1 (*)    Eosinophils Relative 7 (*)    Eosinophils Absolute 0.9 (*)    All other components within normal limits  URINALYSIS, ROUTINE W REFLEX MICROSCOPIC - Abnormal; Notable for the following:    Hgb urine dipstick TRACE (*)  All other components within normal limits  LIPASE, BLOOD  URINE MICROSCOPIC-ADD ON   Results for orders placed during the hospital encounter of 04/26/13  COMPREHENSIVE METABOLIC PANEL      Result Value Range   Sodium 138  135 - 145 mEq/L   Potassium 3.4 (*) 3.5 - 5.1 mEq/L   Chloride 103  96 - 112 mEq/L   CO2 25  19 - 32 mEq/L   Glucose, Bld 100 (*) 70 - 99 mg/dL   BUN 8  6 - 23 mg/dL   Creatinine, Ser 1.61  0.50 - 1.10 mg/dL   Calcium 9.1  8.4 - 09.6 mg/dL   Total Protein 6.4  6.0 - 8.3 g/dL   Albumin 3.4 (*) 3.5 - 5.2 g/dL   AST 17  0 - 37 U/L   ALT 16  0 - 35 U/L   Alkaline Phosphatase 71  39 - 117 U/L   Total Bilirubin 0.1 (*) 0.3 - 1.2 mg/dL   GFR calc non Af Amer 83 (*) >90 mL/min   GFR calc Af Amer >90  >90 mL/min  LIPASE, BLOOD      Result Value Range   Lipase 20  11 - 59 U/L  CBC WITH DIFFERENTIAL      Result Value Range   WBC 12.3 (*) 4.0 - 10.5 K/uL   RBC 3.66 (*) 3.87 - 5.11 MIL/uL   Hemoglobin 11.5 (*) 12.0 - 15.0 g/dL   HCT 04.5 (*) 40.9 - 81.1 %   MCV 92.6  78.0 - 100.0 fL   MCH 31.4  26.0 - 34.0 pg   MCHC 33.9  30.0 - 36.0 g/dL   RDW 91.4  78.2 - 95.6 %   Platelets 301  150 - 400 K/uL   Neutrophils Relative % 51  43 - 77 %   Neutro Abs 6.2  1.7 - 7.7 K/uL   Lymphocytes Relative 33  12 - 46 %   Lymphs Abs 4.1 (*) 0.7 - 4.0 K/uL   Monocytes Relative 8  3 - 12 %   Monocytes Absolute 1.0  0.1 - 1.0 K/uL   Eosinophils Relative 7 (*) 0 - 5 %   Eosinophils Absolute 0.9 (*) 0.0 - 0.7 K/uL   Basophils Relative 1  0 - 1 %   Basophils Absolute 0.1  0.0 - 0.1 K/uL  URINALYSIS, ROUTINE W REFLEX MICROSCOPIC      Result Value Range   Color, Urine YELLOW  YELLOW    APPearance CLEAR  CLEAR   Specific Gravity, Urine 1.010  1.005 - 1.030   pH 6.0  5.0 - 8.0   Glucose, UA NEGATIVE  NEGATIVE mg/dL   Hgb urine dipstick TRACE (*) NEGATIVE   Bilirubin Urine NEGATIVE  NEGATIVE   Ketones, ur NEGATIVE  NEGATIVE mg/dL   Protein, ur NEGATIVE  NEGATIVE mg/dL   Urobilinogen, UA 0.2  0.0 - 1.0 mg/dL   Nitrite NEGATIVE  NEGATIVE   Leukocytes, UA NEGATIVE  NEGATIVE  URINE MICROSCOPIC-ADD ON      Result Value Range   Squamous Epithelial / LPF RARE  RARE   WBC, UA 0-2  <3 WBC/hpf   RBC / HPF 0-2  <3 RBC/hpf   Bacteria, UA RARE  RARE    Imaging Review Dg Abd Acute W/chest  04/26/2013   CLINICAL DATA:  Right upper quadrant pain  EXAM: ACUTE ABDOMEN SERIES (ABDOMEN 2 VIEW & CHEST 1 VIEW)  COMPARISON:  PET-CT 06/26/2011  FINDINGS: Large volume of stool throughout  the colon. No evidence of obstruction. No abnormal intra-abdominal mass effect or pathologic calcification. Lungs are clear and well aerated. No effusion or pneumothorax. Normal heart size. Unchanged [facility] window lymphadenopathy. No acute osseous findings.  IMPRESSION: 1. Large stool volume without obstruction. 2. No evidence of acute cardiopulmonary disease.   Electronically Signed   By: Tiburcio Pea M.D.   On: 04/26/2013 04:21    MDM   1. Chronic abdominal pain    Patient with history of chronic abdominal pain. Followed by pain management. All of her pain medications are being managed by them. Patient does not care for the medications they have her on. Color that she would not be getting narcotics. Labs here today without any acute changes compared to her visit from September 19 and September 22nd. X-rays without any acute abnormalities. Patient's abdomen nonsurgical no acute abdominal process. Patient had arrangements made for GI medicine referral most likely needs an upper endoscopy that is scheduled as per patient. Patient will return for a newer worse symptoms.  Patient was informed that since she is  followed by pain management that all pain medications will have to come from them. That we are not able to prescribe narcotic pain medications for her. Patient's vital signs show that she is in no acute distress. Abdominal x-rays show large stool burden consistent with some constipation but no signs of obstruction.   Shelda Jakes, MD 04/26/13 7829  Shelda Jakes, MD 04/26/13 [phone]

## 2013-04-26 NOTE — ED Notes (Signed)
Pt refused NG tube irrigation. EDP notified

## 2013-04-26 NOTE — ED Notes (Signed)
Reported to CN from A Rhoney, NT, pts father called stating pt called him stating "I'm at [facility] for surgery"

## 2013-04-26 NOTE — ED Notes (Addendum)
Pt reporting upper abdominal pain.  Reporting she was taken off her pain medications by the pain management clinic and started on suboxone about 1 month ago.  Pt states that medication wasn't working, so she stopped taking it.  Pt reports having some leftover Percocet and found those helped her pain better than anything.  Pt also requesting medication for nausea, and something to drink.   Reinforced with pt that due to chief complaint, we cannot provide requested food or drink at this time till after she is evaluated by physician and any ordered tests are resulted.  Explained to pt that physician will be in to evaluate her as soon as possible.

## 2013-04-26 NOTE — ED Notes (Addendum)
Pt reporting pain has suddenly increased.  Reports feeling popping sensation and "like something busted."

## 2013-04-26 NOTE — ED Notes (Signed)
Pt very angry that she is not being admitted.  Explained to pt that there presently is no medical indication for admission.  Pt states "Yes there is!  I have cancer and I have pain."  Reinforced with pt to follow up with pain management physician and or oncologist for continued treatment.  Pt cursing and yelling.  Refused to sign e-signature for discharge.

## 2013-04-26 NOTE — ED Notes (Addendum)
Pt c/o abd pain along with "projectile vomiting" with "bright red blood" x 3 days. Pt stated that MD changed medications 6 weeks ago and ever since then pt has been having abdominal swelling. Pt also c/o brownish reddish blood in stool.

## 2013-04-26 NOTE — ED Notes (Signed)
Pt refusing to sign discharge instructions. 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 Pt crying with her belongings spread out all over room.
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
## 2013-04-26 NOTE — ED Notes (Signed)
Pt being discharged, being given her clothes back.

## 2013-04-26 NOTE — ED Notes (Signed)
Patient c/o RUQ pain; states she is seen at pain clinic for chronic pain.  States pain medication was recently changed and pain is getting worse.

## 2013-04-26 NOTE — ED Notes (Signed)
Pt insistent she needs her Lithum adjusted and other medications refilled. Has an appointment to go to the "Pain Clinic" in near future. 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 Rambles with story. Pt discharged form [facility] at 1301 today with abd pain. Also seen at [facility] today and discharged at 0043 for pain.

## 2013-04-26 NOTE — ED Provider Notes (Signed)
CSN: [id number]     Arrival date & time 04/26/13  1521 History  This chart was scribed for non-physician practitioner working with Audree Camel, MD by Ashley Jacobs, ED scribe. This patient was seen in room WTR4/WLPT4 and the patient's care was started at 5:03 PM    Chief Complaint  Patient presents with  . Medical Clearance   (Consider location/radiation/quality/duration/timing/severity/associated sxs/prior Treatment) The history is provided by the patient and medical records. No language interpreter was used.   HPI Comments: Danielle Swanson is a 42 y.o. female who presents to the Emergency Department after visiting [facility] for a medical clearance. Pt reports hat she has been in pain for the past two days that she "can't get out of pain." . She explains that she is having a gastritis flare-up, abdominal pain and abdominal bloating and nothing seems to relieve her pain. Pt states that she has breast, lung, and liver cancer. She mentions running out of pain medications Friday and not being able to visit the pain management facility until Monday. When told she was not going to receive pain medication today she begins to yell and scream. She cries that she is undergoing withdrawal symptoms. She denies having Cervoxan at home and she denies being treated at [facility] and [facility] earlier today.   Pt has a hx of bipolar 1 disorder and PTSD. Pt also has a hx of fibromyalgia, arthritis and CAD. Pt's pain specialist is Dr. Melton Alar. Past Medical History  Diagnosis Date  . Bipolar 1 disorder   . PTSD (post-traumatic stress disorder)   . Fibromyalgia   . Mass of left breast   . Arthritis   . Coronary artery disease   . Myocardial infarct   . Tumors   . Stomach ulcer   . Lung cancer    Past Surgical History  Procedure Laterality Date  . Abdominal hysterectomy    . Breast surgery     Family History  Problem Relation Age of Onset  . Cancer Other   . Diabetes Other     History  Substance Use Topics  . Smoking status: Current Every Day Smoker -- 0.50 packs/day    Types: Cigarettes  . Smokeless tobacco: Not on file  . Alcohol Use: No   OB History   Grav Para Term Preterm Abortions TAB SAB Ect Mult Living   2 1 1  1  1   1      Review of Systems  Gastrointestinal: Positive for abdominal pain and abdominal distention.  All other systems reviewed and are negative.    Allergies  Aspirin; Ranitidine; Haldol; Imitrex; Nsaids; Suboxone; Sulfa antibiotics; Toradol; Tylenol; and Ultram  Home Medications   Current Outpatient Rx  Name  Route  Sig  Dispense  Refill  . albuterol-ipratropium (COMBIVENT) 18-103 MCG/ACT inhaler   Inhalation   Inhale 2 puffs into the lungs every 6 (six) hours as needed for wheezing or shortness of breath.          . ALPRAZolam (XANAX) 1 MG tablet   Oral   Take 1 tablet (1 mg total) by mouth 3 (three) times daily as needed for anxiety.   12 tablet   0   . buprenorphine-naloxone (SUBOXONE) 8-2 MG SUBL SL tablet   Sublingual   Place 1 tablet under the tongue every 12 (twelve) hours.         Marland Kitchen desloratadine (CLARINEX) 5 MG tablet   Oral   Take 1 tablet (5  mg total) by mouth daily.   30 tablet   0   . estradiol (ESTRACE) 1 MG tablet   Oral   Take 1 tablet (1 mg total) by mouth daily.   12 tablet   0   . gabapentin (NEURONTIN) 600 MG tablet   Oral   Take 1,200 mg by mouth 2 (two) times daily.         Marland Kitchen lisinopril (PRINIVIL,ZESTRIL) 10 MG tablet   Oral   Take 1 tablet (10 mg total) by mouth daily.   12 tablet   0   . omeprazole (PRILOSEC) 20 MG capsule   Oral   Take 20 mg by mouth 2 (two) times daily.         . ondansetron (ZOFRAN-ODT) 4 MG disintegrating tablet   Oral   Take 4 mg by mouth every 8 (eight) hours as needed for nausea.         . promethazine (PHENERGAN) 25 MG tablet   Oral   Take 1 tablet (25 mg total) by mouth every 6 (six) hours as needed for nausea.   15 tablet   0   .  QUEtiapine (SEROQUEL) 300 MG tablet   Oral   Take 300 mg by mouth at bedtime.         . sodium phosphate (FLEET) enema   Rectal   Place 1 enema rectally as needed (constipation). follow package directions         . triamcinolone (NASACORT) 55 MCG/ACT nasal inhaler   Nasal   Place 2 sprays into the nose daily as needed (Allergies).         Marland Kitchen dicyclomine (BENTYL) 20 MG tablet   Oral   Take 1 tablet (20 mg total) by mouth 2 (two) times daily.   20 tablet   0   . pantoprazole (PROTONIX) 20 MG tablet   Oral   Take 2 tablets (40 mg total) by mouth daily.   15 tablet   0    There were no vitals taken for this visit. Physical Exam  Nursing note and vitals reviewed. Constitutional: She is oriented to person, place, and time. She appears well-developed and well-nourished.  She tearful, cursing and screaming.   HENT:  Head: Normocephalic.  Eyes: EOM are normal.  Neck: Normal range of motion.  Cardiovascular: Normal rate and regular rhythm.   Pulmonary/Chest: Effort normal. No respiratory distress. She has no wheezes.  Abdominal: Soft. She exhibits no distension. There is no tenderness.  Musculoskeletal: Normal range of motion.  Neurological: She is alert and oriented to person, place, and time.  Psychiatric: Thought content normal. Her affect is angry, labile and inappropriate. Her speech is rapid and/or pressured. She is agitated and aggressive.    ED Course  Procedures (including critical care time) DIAGNOSTIC STUDIES: Oxygen Saturation is 100% on RA, Normalby my interpretation.    COORDINATION OF CARE: 5:14 PM Discussed course of care with pt . Pt refuses further treatment and demands to be discharged.  Labs Review Labs Reviewed  CBC - Abnormal; Notable for the following:    Hemoglobin 11.9 (*)    HCT 35.9 (*)    All other components within normal limits  COMPREHENSIVE METABOLIC PANEL - Abnormal; Notable for the following:    Glucose, Bld 117 (*)    Total  Bilirubin 0.1 (*)    GFR calc non Af Amer 89 (*)    All other components within normal limits  ETHANOL  URINE RAPID DRUG SCREEN (HOSP  PERFORMED)  POCT PREGNANCY, URINE   Imaging Review Dg Abd Acute W/chest  04/26/2013   CLINICAL DATA:  Right upper quadrant pain  EXAM: ACUTE ABDOMEN SERIES (ABDOMEN 2 VIEW & CHEST 1 VIEW)  COMPARISON:  PET-CT 06/26/2011  FINDINGS: Large volume of stool throughout the colon. No evidence of obstruction. No abnormal intra-abdominal mass effect or pathologic calcification. Lungs are clear and well aerated. No effusion or pneumothorax. Normal heart size. Unchanged AP window lymphadenopathy. No acute osseous findings.  IMPRESSION: 1. Large stool volume without obstruction. 2. No evidence of acute cardiopulmonary disease.   Electronically Signed   By: Tiburcio Pea M.D.   On: 04/26/2013 04:21    MDM   1. Chronic pain   2. Bipolar 1 disorder    Pt with hx of chronic pain followed by pain management. She was initially checked and is a medical clearance stating that if she doesn't get her pain medicine she will kill herself. However after I went in to talk to her patient denied any suicidal ideations and she stated that she just needs something for pain. Patient according to our records was just seen at [facility] and an EpiPen today for the same complaint. Labs and she was given pain medicine just a few hours ago was was called. Patient was giving IV Dilaudid. She was not given a prescription for any medications that her narcotic and patient states that she she doesn't get any she will start withdrawing. At this time there is no signs of withdrawal. Patient is very angry and screaming cursing in the room and hallway. I explained to her that I will not begin her pain medicine and if there is any other way can help her I will do so. Patient stated she did not want any more help than stated she wanted to leave she's not having any pain medicine. Discharge patient home she is to  follow up with a pain specialist.   Filed Vitals:   04/26/13 1708  BP: 109/54  Pulse: 74  Temp: 97.7 F (36.5 C)  TempSrc: Oral  Resp: 16  SpO2: 98%   I personally performed the services described in this documentation, which was scribed in my presence. The recorded information has been reviewed and is accurate.    Lottie Mussel, PA-C 04/26/13 1750

## 2013-04-28 ENCOUNTER — Emergency Department (HOSPITAL_COMMUNITY)
Admission: EM | Admit: 2013-04-28 | Discharge: 2013-04-28 | Disposition: A | Discharge status: Home / Self Care | Payer: Medicaid Other | Attending: Emergency Medicine | Admitting: Emergency Medicine

## 2013-04-28 ENCOUNTER — Encounter (HOSPITAL_COMMUNITY): Payer: Self-pay | Admitting: *Deleted

## 2013-04-28 DIAGNOSIS — Z8742 Personal history of other diseases of the female genital tract: Secondary | ICD-10-CM | POA: Insufficient documentation

## 2013-04-28 DIAGNOSIS — R630 Anorexia: Secondary | ICD-10-CM | POA: Insufficient documentation

## 2013-04-28 DIAGNOSIS — Z8711 Personal history of peptic ulcer disease: Secondary | ICD-10-CM | POA: Insufficient documentation

## 2013-04-28 DIAGNOSIS — I252 Old myocardial infarction: Secondary | ICD-10-CM | POA: Insufficient documentation

## 2013-04-28 DIAGNOSIS — I2510 Atherosclerotic heart disease of native coronary artery without angina pectoris: Secondary | ICD-10-CM | POA: Insufficient documentation

## 2013-04-28 DIAGNOSIS — IMO0001 Reserved for inherently not codable concepts without codable children: Secondary | ICD-10-CM | POA: Insufficient documentation

## 2013-04-28 DIAGNOSIS — F319 Bipolar disorder, unspecified: Secondary | ICD-10-CM | POA: Insufficient documentation

## 2013-04-28 DIAGNOSIS — G8929 Other chronic pain: Secondary | ICD-10-CM | POA: Insufficient documentation

## 2013-04-28 DIAGNOSIS — Z85118 Personal history of other malignant neoplasm of bronchus and lung: Secondary | ICD-10-CM | POA: Insufficient documentation

## 2013-04-28 DIAGNOSIS — R1110 Vomiting, unspecified: Secondary | ICD-10-CM | POA: Insufficient documentation

## 2013-04-28 DIAGNOSIS — Z79899 Other long term (current) drug therapy: Secondary | ICD-10-CM | POA: Insufficient documentation

## 2013-04-28 DIAGNOSIS — F17200 Nicotine dependence, unspecified, uncomplicated: Secondary | ICD-10-CM | POA: Insufficient documentation

## 2013-04-28 DIAGNOSIS — F4310 Post-traumatic stress disorder, unspecified: Secondary | ICD-10-CM | POA: Insufficient documentation

## 2013-04-28 DIAGNOSIS — Z8505 Personal history of malignant neoplasm of liver: Secondary | ICD-10-CM | POA: Insufficient documentation

## 2013-04-28 DIAGNOSIS — Z8739 Personal history of other diseases of the musculoskeletal system and connective tissue: Secondary | ICD-10-CM | POA: Insufficient documentation

## 2013-04-28 DIAGNOSIS — R109 Unspecified abdominal pain: Secondary | ICD-10-CM | POA: Insufficient documentation

## 2013-04-28 LAB — BASIC METABOLIC PANEL
BUN: 5 mg/dL — ABNORMAL LOW (ref 6–23)
CO2: 23 mEq/L (ref 19–32)
Calcium: 10 mg/dL (ref 8.4–10.5)
Creatinine, Ser: 0.77 mg/dL (ref 0.50–1.10)
Glucose, Bld: 122 mg/dL — ABNORMAL HIGH (ref 70–99)

## 2013-04-28 LAB — URINALYSIS, ROUTINE W REFLEX MICROSCOPIC
Bilirubin Urine: NEGATIVE
Ketones, ur: NEGATIVE mg/dL
Leukocytes, UA: NEGATIVE
Nitrite: NEGATIVE
Protein, ur: NEGATIVE mg/dL
Urobilinogen, UA: 0.2 mg/dL (ref 0.0–1.0)
pH: 5.5 (ref 5.0–8.0)

## 2013-04-28 LAB — CBC
MCH: 31.4 pg (ref 26.0–34.0)
MCHC: 33.8 g/dL (ref 30.0–36.0)
MCV: 92.7 fL (ref 78.0–100.0)
Platelets: 297 10*3/uL (ref 150–400)
RDW: 13.7 % (ref 11.5–15.5)

## 2013-04-28 MED ORDER — ONDANSETRON HCL 4 MG/2ML IJ SOLN
4.0000 mg | Freq: Once | INTRAMUSCULAR | Status: AC
  Administered 2013-04-28: 4 mg via INTRAVENOUS
  Filled 2013-04-28: qty 2

## 2013-04-28 MED ORDER — OXYCODONE HCL 15 MG PO TABS: 15.0000 mg | ORAL_TABLET | Freq: Four times a day (QID) | ORAL | Status: DC | PRN

## 2013-04-28 MED ORDER — HYDROMORPHONE HCL PF 1 MG/ML IJ SOLN
1.0000 mg | Freq: Once | INTRAMUSCULAR | Status: AC
  Administered 2013-04-28: 1 mg via INTRAVENOUS
  Filled 2013-04-28: qty 1

## 2013-04-28 MED ORDER — SODIUM CHLORIDE 0.9 % IV BOLUS (SEPSIS)
1000.0000 mL | Freq: Once | INTRAVENOUS | Status: AC
  Administered 2013-04-28: 1000 mL via INTRAVENOUS

## 2013-04-28 MED ORDER — ONDANSETRON 4 MG PO TBDP: ORAL_TABLET | ORAL | Status: DC

## 2013-04-28 NOTE — ED Notes (Signed)
Pt states CP with left arm and left leg tingling x 3 days. Pt also states vomiting began x 3 days also. Seen here 2 days ago and seen at [facility] yesterday for same symptoms. Pt has not had prescriptions filled from either of the visits. Last vomited 1 hour ago.

## 2013-04-28 NOTE — ED Notes (Signed)
Pt alert & oriented x4, stable gait. Patient given discharge instructions, paperwork & prescription(s). Patient  instructed to stop at the registration desk to finish any additional paperwork. Patient verbalized understanding. Pt left department w/ no further questions. 

## 2013-04-28 NOTE — ED Notes (Signed)
Pt adds that she had seizure-like activity this morning without hx .

## 2013-04-28 NOTE — Discharge Instructions (Signed)
Follow up with your pain md as planned on the 15th

## 2013-04-28 NOTE — ED Provider Notes (Signed)
CSN: [id number]     Arrival date & time 04/28/13  1218 History  This chart was scribed for Danielle Lennert, MD by Leone Payor, ED Scribe. This patient was seen in room APA10/[facility]10 and the patient's care was started 3:10 PM.    Chief Complaint  Patient presents with  . Chest Pain  . Emesis    Patient is a 41 y.o. female presenting with vomiting. The history is provided by the patient. No language interpreter was used.  Emesis Severity:  Mild Duration:  3 days Timing:  Intermittent Quality:  Unable to specify Progression:  Unchanged Relieved by:  Nothing Associated symptoms: abdominal pain   Associated symptoms: no diarrhea and no headaches     HPI Comments: Danielle Swanson is a 42 y.o. female who presents to the Emergency Department complaining of intermittent episodes of emesis that began 3 days ago. Pt was seen on 04/26/13 here and at [facility] ED for similar symptoms. She reports running out of her 20 mg oxycodone which she takes 4 times per day. States her last dose was 3 days ago and has been having withdrawal symptoms since then. She also states having decreased appetite in the last 3 days. She also reports a history of lung and liver CA. Pt has h/o bipolar 1 disorder, PTSD, fibromyalgia, CAD, MI, stomach ulcers.   Past Medical History  Diagnosis Date  . Bipolar 1 disorder   . PTSD (post-traumatic stress disorder)   . Fibromyalgia   . Mass of left breast   . Arthritis   . Coronary artery disease   . Myocardial infarct   . Tumors   . Stomach ulcer   . Lung cancer    Past Surgical History  Procedure Laterality Date  . Abdominal hysterectomy    . Breast surgery     Family History  Problem Relation Age of Onset  . Cancer Other   . Diabetes Other    History  Substance Use Topics  . Smoking status: Current Every Day Smoker -- 0.50 packs/day    Types: Cigarettes  . Smokeless tobacco: Not on file  . Alcohol Use: No   OB History   Grav Para Term Preterm Abortions TAB SAB Ect  Mult Living   2 1 1  1  1   1      Review of Systems  Constitutional: Negative for appetite change and fatigue.  HENT: Negative for congestion, sinus pressure and ear discharge.   Eyes: Negative for discharge.  Respiratory: Negative for cough.   Cardiovascular: Negative for chest pain.  Gastrointestinal: Positive for vomiting and abdominal pain. Negative for diarrhea.  Genitourinary: Negative for frequency and hematuria.  Musculoskeletal: Negative for back pain.  Skin: Negative for rash.  Neurological: Negative for seizures and headaches.  Psychiatric/Behavioral: Negative for hallucinations.    Allergies  Aspirin; Ranitidine; Haldol; Imitrex; Nsaids; Suboxone; Sulfa antibiotics; Toradol; Tylenol; and Ultram  Home Medications   Current Outpatient Rx  Name  Route  Sig  Dispense  Refill  . albuterol-ipratropium (COMBIVENT) 18-103 MCG/ACT inhaler   Inhalation   Inhale 2 puffs into the lungs every 6 (six) hours as needed for wheezing or shortness of breath.          . ALPRAZolam (XANAX) 1 MG tablet   Oral   Take 1 tablet (1 mg total) by mouth 3 (three) times daily as needed for anxiety.   12 tablet   0   . buprenorphine-naloxone (SUBOXONE) 8-2 MG SUBL SL tablet  Sublingual   Place 1 tablet under the tongue every 12 (twelve) hours.         Marland Kitchen desloratadine (CLARINEX) 5 MG tablet   Oral   Take 1 tablet (5 mg total) by mouth daily.   30 tablet   0   . dicyclomine (BENTYL) 20 MG tablet   Oral   Take 1 tablet (20 mg total) by mouth 2 (two) times daily.   20 tablet   0   . estradiol (ESTRACE) 1 MG tablet   Oral   Take 1 tablet (1 mg total) by mouth daily.   12 tablet   0   . gabapentin (NEURONTIN) 600 MG tablet   Oral   Take 1,200 mg by mouth 2 (two) times daily.         Marland Kitchen lisinopril (PRINIVIL,ZESTRIL) 10 MG tablet   Oral   Take 1 tablet (10 mg total) by mouth daily.   12 tablet   0   . omeprazole (PRILOSEC) 20 MG capsule   Oral   Take 20 mg by mouth 2  (two) times daily.         . ondansetron (ZOFRAN-ODT) 4 MG disintegrating tablet   Oral   Take 4 mg by mouth every 8 (eight) hours as needed for nausea.         . pantoprazole (PROTONIX) 20 MG tablet   Oral   Take 2 tablets (40 mg total) by mouth daily.   15 tablet   0   . promethazine (PHENERGAN) 25 MG tablet   Oral   Take 1 tablet (25 mg total) by mouth every 6 (six) hours as needed for nausea.   15 tablet   0   . QUEtiapine (SEROQUEL) 300 MG tablet   Oral   Take 300 mg by mouth at bedtime.         . sodium phosphate (FLEET) enema   Rectal   Place 1 enema rectally as needed (constipation). follow package directions         . triamcinolone (NASACORT) 55 MCG/ACT nasal inhaler   Nasal   Place 2 sprays into the nose daily as needed (Allergies).          BP 109/80  Pulse 89  Temp(Src) 97.5 F (36.4 C) (Oral)  Resp 18  Ht 5' 3.5" (1.613 m)  Wt 120 lb (54.432 kg)  BMI 20.92 kg/m2  SpO2 99% Physical Exam  Nursing note and vitals reviewed. Constitutional: She is oriented to person, place, and time. She appears well-developed.  HENT:  Head: Normocephalic.  Eyes: Conjunctivae and EOM are normal. No scleral icterus.  Neck: Neck supple. No thyromegaly present.  Cardiovascular: Normal rate and regular rhythm.  Exam reveals no gallop and no friction rub.   No murmur heard. Pulmonary/Chest: No stridor. She has no wheezes. She has no rales. She exhibits no tenderness.  Abdominal: Soft. Bowel sounds are normal. She exhibits no distension. There is tenderness. There is no rebound.  Minor tenderness throughout abdomen.   Musculoskeletal: Normal range of motion. She exhibits no edema.  Lymphadenopathy:    She has no cervical adenopathy.  Neurological: She is oriented to person, place, and time. She exhibits normal muscle tone. Coordination normal.  Skin: No rash noted. No erythema.  Psychiatric: She has a normal mood and affect. Her behavior is normal.    ED Course   Procedures   DIAGNOSTIC STUDIES: Oxygen Saturation is 99% on Ra, normal by my interpretation.    COORDINATION OF  CARE: 3:17 PM Discussed treatment plan with pt at bedside and pt agreed to plan.   Labs Review Labs Reviewed  BASIC METABOLIC PANEL - Abnormal; Notable for the following:    Potassium 3.3 (*)    Glucose, Bld 122 (*)    BUN 5 (*)    All other components within normal limits  CBC - Abnormal; Notable for the following:    WBC 10.7 (*)    All other components within normal limits  URINALYSIS, ROUTINE W REFLEX MICROSCOPIC   Imaging Review No results found.  MDM  No diagnosis found. Chronic pain.  Pt will be given enough pain medicine to get her to her appointment on 10/15  The chart was scribed for me under my direct supervision.  I personally performed the history, physical, and medical decision making and all procedures in the evaluation of this patient.Danielle Lennert, MD 04/28/13 [phone]

## 2013-05-06 ENCOUNTER — Encounter: Payer: Self-pay | Admitting: Gastroenterology

## 2013-05-06 ENCOUNTER — Ambulatory Visit (INDEPENDENT_AMBULATORY_CARE_PROVIDER_SITE_OTHER): Payer: Medicaid Other | Admitting: Gastroenterology

## 2013-05-06 VITALS — BP 115/65 | HR 62 | Temp 97.6°F | Ht 62.0 in | Wt 132.8 lb

## 2013-05-06 DIAGNOSIS — R109 Unspecified abdominal pain: Secondary | ICD-10-CM

## 2013-05-06 DIAGNOSIS — R195 Other fecal abnormalities: Secondary | ICD-10-CM

## 2013-05-06 MED ORDER — PEG 3350-KCL-NA BICARB-NACL 420 G PO SOLR: 4000.0000 mL | ORAL | Status: DC

## 2013-05-06 MED ORDER — DICYCLOMINE HCL 10 MG PO CAPS: 10.0000 mg | ORAL_CAPSULE | Freq: Three times a day (TID) | ORAL | Status: DC

## 2013-05-06 MED ORDER — ONDANSETRON HCL 4 MG PO TABS
4.0000 mg | ORAL_TABLET | Freq: Three times a day (TID) | ORAL | Status: DC | PRN
Start: 2013-05-06 — End: 2013-06-08

## 2013-05-06 NOTE — Progress Notes (Signed)
Primary Care Physician:  [facility] Primary Gastroenterologist:  Dr. Jena Gauss   Chief Complaint  Patient presents with  . Abdominal Pain    HPI:   [name] presents today at the request of the ED due to persistent abdominal pain. She has had multiple visits to the ED. She reports a complicated history to include breast cancer, ovarian cancer, possible lung cancer, and even possible liver cancer. However, it appears she has been followed by Dr. Edwyna Shell in the past for lung nodules. I do not see documentation of liver cancer; her history is quite vague.   Notes Prilosec x 7 years. Reports history of possible PUD but NO EGD. Allergic to aspirin. Moved here a year ago from [place]. Dr. Edwyna Shell in [facility]: thoracic surgeon in [place]. No biopsy of liver nodules. States it was seen 4 years ago. ?ovarian cancer? No oncologist. Will be referred to oncologist in near future per patient.   No energy. Epigastric pain, burning, constant for 4 weeks. Has to use Phenergan in order to tolerate eating/drinking. Otherwise starts gagging. States never felt this sick, even with chemo. Belching. Diarrhea X 2 weeks. Pure acid. Too numerous to count. Goes right through 4 hours later. Threw up bright red blood. Specks in emesis. Low-volume hematochezia. No melena. No prior colonoscopy. No NSAIDs or aspirin powders. No recent abx. Well water. No sick contacts. Alternating Zofran and Phenergan. Prilosec BID. Severe reflux. No dysphagia.   Past Medical History  Diagnosis Date  . Bipolar 1 disorder   . PTSD (post-traumatic stress disorder)   . Fibromyalgia   . Mass of left breast     breast cancer?   Marland Kitchen Arthritis   . Coronary artery disease   . Myocardial infarct   . Tumors   . Stomach ulcer   . Lung cancer   . Ovarian cancer     per patient  . GERD (gastroesophageal reflux disease)     Past Surgical History  Procedure Laterality Date  . Abdominal hysterectomy  2011  . Breast lumpectomy   2011    Current Outpatient Prescriptions  Medication Sig Dispense Refill  . albuterol-ipratropium (COMBIVENT) 18-103 MCG/ACT inhaler Inhale 2 puffs into the lungs every 6 (six) hours as needed for wheezing or shortness of breath.       . ALPRAZolam (XANAX) 1 MG tablet Take 1 tablet (1 mg total) by mouth 3 (three) times daily as needed for anxiety.  12 tablet  0  . desloratadine (CLARINEX) 5 MG tablet Take 1 tablet (5 mg total) by mouth daily.  30 tablet  0  . estradiol (ESTRACE) 1 MG tablet Take 1 tablet (1 mg total) by mouth daily.  12 tablet  0  . gabapentin (NEURONTIN) 600 MG tablet Take 1,200 mg by mouth 2 (two) times daily.      Marland Kitchen lisinopril (PRINIVIL,ZESTRIL) 10 MG tablet Take 1 tablet (10 mg total) by mouth daily.  12 tablet  0  . omeprazole (PRILOSEC) 20 MG capsule Take 20 mg by mouth 2 (two) times daily.      Marland Kitchen oxyCODONE (ROXICODONE) 15 MG immediate release tablet Take 1 tablet (15 mg total) by mouth every 6 (six) hours as needed for pain.  40 tablet  0  . promethazine (PHENERGAN) 25 MG tablet Take 1 tablet (25 mg total) by mouth every 6 (six) hours as needed for nausea.  15 tablet  0  . QUEtiapine (SEROQUEL) 300 MG tablet Take 300 mg by mouth at bedtime.  No current facility-administered medications for this visit.    Allergies as of 05/06/2013 - Review Complete 05/06/2013  Allergen Reaction Noted  . Aspirin Other (See Comments) 02/08/2011  . Ranitidine Swelling 04/26/2013  . Haldol [haloperidol decanoate] Other (See Comments) 02/08/2011  . Imitrex [sumatriptan]  07/23/2012  . Nsaids Other (See Comments) 02/08/2011  . Suboxone [buprenorphine hcl-naloxone hcl]  04/26/2013  . Sulfa antibiotics Other (See Comments) 02/08/2011  . Toradol [ketorolac tromethamine] Swelling 12/06/2012  . Tylenol [acetaminophen] Other (See Comments) 07/23/2012  . Ultram [tramadol hcl] Nausea Only 04/21/2011    Family History  Problem Relation Age of Onset  . Cancer Other   . Diabetes Other    . Colon cancer      uncles per patient  . Stomach cancer      uncles per patient    History   Social History  . Marital Status: Divorced    Spouse Name: N/A    Number of Children: N/A  . Years of Education: N/A   Occupational History  . disability    Social History Main Topics  . Smoking status: Current Every Day Smoker -- 0.50 packs/day    Types: Cigarettes  . Smokeless tobacco: Not on file     Comment: trying to wean off, quit.   . Alcohol Use: No  . Drug Use: No  . Sexual Activity: No   Other Topics Concern  . Not on file   Social History Narrative  . No narrative on file    Review of Systems: Gen: Denies any fever, chills, fatigue, weight loss, lack of appetite.  CV: Denies chest pain, heart palpitations, peripheral edema, syncope.  Resp: Denies shortness of breath at rest or with exertion. Denies wheezing or cough.  GI: Denies dysphagia or odynophagia. Denies jaundice, hematemesis, fecal incontinence. GU : Denies urinary burning, urinary frequency, urinary hesitancy MS: Denies joint pain, muscle weakness, cramps, or limitation of movement.  Derm: Denies rash, itching, dry skin Psych: Denies depression, anxiety, memory loss, and confusion Heme: Denies bruising, bleeding, and enlarged lymph nodes.  Physical Exam: BP 115/65  Pulse 62  Temp(Src) 97.6 F (36.4 C) (Oral)  Ht 5\' 2"  (1.575 m)  Wt 132 lb 12.8 oz (60.238 kg)  BMI 24.28 kg/m2 General:   Alert and oriented. Pleasant and cooperative. Well-nourished and well-developed.  Head:  Normocephalic and atraumatic. Eyes:  Without icterus, sclera clear and conjunctiva pink.  Ears:  Normal auditory acuity. Nose:  No deformity, discharge,  or lesions. Mouth:  No deformity or lesions, oral mucosa pink.  Neck:  Supple, without mass or thyromegaly. Lungs:  Clear to auscultation bilaterally. No wheezes, rales, or rhonchi. No distress.  Heart:  S1, S2 present without murmurs appreciated.  Abdomen:  +BS, soft, TTP  epigastric, LUQ and non-distended. No HSM noted. No guarding or rebound. No masses appreciated.  Rectal:  Deferred  Msk:  Symmetrical without gross deformities. Normal posture. Pulses:  Normal pulses noted. Extremities:  Without clubbing or edema. Neurologic:  Alert and  oriented x4;  grossly normal neurologically. Skin:  Intact without significant lesions or rashes. Cervical Nodes:  No significant cervical adenopathy. Psych:  Alert and cooperative. Normal mood and affect.  Lab Results  Component Value Date   WBC 10.7* 04/28/2013   HGB 13.8 04/28/2013   HCT 40.8 04/28/2013   MCV 92.7 04/28/2013   PLT 297 04/28/2013   Lab Results  Component Value Date   LIPASE 20 04/26/2013   Lab Results  Component Value Date  ALT 17 04/26/2013   AST 15 04/26/2013   ALKPHOS 77 04/26/2013   BILITOT 0.1* 04/26/2013   AAS 04/26/13: Large stool volume without obstruction.

## 2013-05-06 NOTE — Patient Instructions (Signed)
We have set you up for a colonoscopy and upper endoscopy with Dr. Jena Gauss in the near future.   Please complete the stool samples as soon as possible and return to the lab.  We have sent your medications for nausea (Zofran) to the pharmacy. Also, start taking Bentyl with your meals and at bedtime. This is for loose stool and cramping.   Further recommendations to follow.

## 2013-05-06 NOTE — Assessment & Plan Note (Addendum)
Epigastric pain X 4 weeks, associated nausea and vomiting, severe GERD, streaky hematemesis. Possible history of PUD per patient, but she denies prior EGD for documentation of this. On Prilosec BID. No dysphagia. Her history is quite elaborate as stated in HPI; I question the validity of some of her reports. I have requested her to bring outside records for our review. In the interim, needs EGD with Dr. Jena Gauss in near future to assess for gastritis, esophagitis,PUD. Likely low-volume hematemesis secondary to multiple episodes of vomiting. Patient in no distress at time of visit. Consider [place] of abdomen if EGD benign.   Will need Phenergan 25 mg IV on call due to polypharmacy. Patient states understanding of the risks and benefits. Add Zofran prn.

## 2013-05-07 NOTE — Assessment & Plan Note (Signed)
Multiple loose stools, denies exposure to abx. Check Cdiff, stool culture, Giardia. Low-volume hematochezia likely benign anorectal source. However, will proceed with colonoscopy at time of EGD. Risks and benefits discussed and understood by patient. Phenergan 25 mg IV on call due to polypharmacy. Add Bentyl for supportive measures.

## 2013-05-08 ENCOUNTER — Encounter (HOSPITAL_COMMUNITY): Payer: Self-pay | Admitting: Pharmacy Technician

## 2013-05-08 NOTE — Progress Notes (Signed)
cc'd to pcp 

## 2013-05-22 ENCOUNTER — Encounter (HOSPITAL_COMMUNITY)
Admission: RE | Disposition: A | Discharge status: Home / Self Care | Payer: Self-pay | Source: Ambulatory Visit | Attending: Internal Medicine

## 2013-05-22 ENCOUNTER — Ambulatory Visit (HOSPITAL_COMMUNITY)
Admission: RE | Admit: 2013-05-22 | Discharge: 2013-05-22 | Disposition: A | Discharge status: Home / Self Care | Payer: Medicaid Other | Source: Ambulatory Visit | Attending: Internal Medicine | Admitting: Internal Medicine

## 2013-05-22 ENCOUNTER — Encounter (HOSPITAL_COMMUNITY): Payer: Self-pay | Admitting: *Deleted

## 2013-05-22 DIAGNOSIS — R197 Diarrhea, unspecified: Secondary | ICD-10-CM

## 2013-05-22 DIAGNOSIS — R1013 Epigastric pain: Secondary | ICD-10-CM | POA: Insufficient documentation

## 2013-05-22 DIAGNOSIS — R195 Other fecal abnormalities: Secondary | ICD-10-CM

## 2013-05-22 DIAGNOSIS — R109 Unspecified abdominal pain: Secondary | ICD-10-CM

## 2013-05-22 DIAGNOSIS — K921 Melena: Secondary | ICD-10-CM | POA: Insufficient documentation

## 2013-05-22 HISTORY — PX: COLONOSCOPY WITH ESOPHAGOGASTRODUODENOSCOPY (EGD): SHX5779

## 2013-05-22 LAB — CLOSTRIDIUM DIFFICILE BY PCR: Toxigenic C. Difficile by PCR: NEGATIVE

## 2013-05-22 SURGERY — COLONOSCOPY WITH ESOPHAGOGASTRODUODENOSCOPY (EGD)
Anesthesia: Moderate Sedation

## 2013-05-22 MED ORDER — MIDAZOLAM HCL 5 MG/5ML IJ SOLN
INTRAMUSCULAR | Status: DC | PRN
  Administered 2013-05-22: 1 mg via INTRAVENOUS
  Administered 2013-05-22 (×5): 2 mg via INTRAVENOUS

## 2013-05-22 MED ORDER — MIDAZOLAM HCL 5 MG/5ML IJ SOLN
INTRAMUSCULAR | Status: AC
  Filled 2013-05-22: qty 10

## 2013-05-22 MED ORDER — ONDANSETRON HCL 4 MG/2ML IJ SOLN
INTRAMUSCULAR | Status: DC | PRN
  Administered 2013-05-22: 4 mg via INTRAVENOUS

## 2013-05-22 MED ORDER — STERILE WATER FOR IRRIGATION IR SOLN
Status: DC | PRN
  Administered 2013-05-22: 12:00:00

## 2013-05-22 MED ORDER — ONDANSETRON HCL 4 MG/2ML IJ SOLN
INTRAMUSCULAR | Status: AC
  Filled 2013-05-22: qty 2

## 2013-05-22 MED ORDER — SODIUM CHLORIDE 0.9 % IJ SOLN
INTRAMUSCULAR | Status: AC
  Filled 2013-05-22: qty 10

## 2013-05-22 MED ORDER — SODIUM CHLORIDE 0.9 % IV SOLN
INTRAVENOUS | Status: DC
  Administered 2013-05-22: 25 mL via INTRAVENOUS

## 2013-05-22 MED ORDER — MIDAZOLAM HCL 5 MG/5ML IJ SOLN
INTRAMUSCULAR | Status: AC
  Filled 2013-05-22: qty 5

## 2013-05-22 MED ORDER — MEPERIDINE HCL 100 MG/ML IJ SOLN
INTRAMUSCULAR | Status: AC
  Filled 2013-05-22: qty 2

## 2013-05-22 MED ORDER — PROMETHAZINE HCL 25 MG/ML IJ SOLN
25.0000 mg | Freq: Once | INTRAMUSCULAR | Status: AC
  Administered 2013-05-22: 25 mg via INTRAVENOUS
  Filled 2013-05-22: qty 1

## 2013-05-22 MED ORDER — BUTAMBEN-TETRACAINE-BENZOCAINE 2-2-14 % EX AERO
INHALATION_SPRAY | CUTANEOUS | Status: DC | PRN
  Administered 2013-05-22: 2 via TOPICAL

## 2013-05-22 MED ORDER — MEPERIDINE HCL 100 MG/ML IJ SOLN
INTRAMUSCULAR | Status: DC | PRN
  Administered 2013-05-22 (×4): 50 mg via INTRAVENOUS

## 2013-05-22 NOTE — Op Note (Signed)
[facility] [address] [place], [address]   COLONOSCOPY PROCEDURE REPORT  PATIENT: Danielle Swanson, Danielle Swanson  MR#:         [id number] BIRTHDATE: 1971/06/24 , 42  yrs. old GENDER: Female ENDOSCOPIST: R.  Roetta Sessions, MD FACP FACG REFERRED BY:  Vivia Ewing, M.D. PROCEDURE DATE:  05/22/2013 PROCEDURE:      Ileocolonoscopy with segmental biopsy and stool sampling  INDICATIONS: reported bloody diarrhea  INFORMED CONSENT:  The risks, benefits, alternatives and imponderables including but not limited to bleeding, perforation as well as the possibility of a missed lesion have been reviewed.  The potential for biopsy, lesion removal, etc. have also been discussed.  Questions have been answered.  All parties agreeable. Please see the history and physical in the medical record for more information.  MEDICATIONS: Versed 11 mg IV and Demerol 200 mg IV in divided doses. Phenergan 25 mg IV. Zofran 4 mg IV.  DESCRIPTION OF PROCEDURE:  After a digital rectal exam was performed, the EC-[id number] ([id number])  colonoscope was advanced from the anus through the rectum and colon to the area of the cecum, ileocecal valve and appendiceal orifice.  The cecum was deeply intubated.  These structures were well-seen and photographed for the record.  From the level of the cecum and ileocecal valve, the scope was slowly and cautiously withdrawn.  The mucosal surfaces were carefully surveyed utilizing scope tip deflection to facilitate fold flattening as needed.  The scope was pulled down into the rectum where a thorough examination was performed.    FINDINGS:  Adequate preparation. Normal rectum. Small rectal vault. Unable to retroflex. However, rectum seen very well on-face. Normal-appearing colonic mucosa. The distal 5 cm of terminal ileal mucosa also appeared normal.  THERAPEUTIC / DIAGNOSTIC MANEUVERS PERFORMED:  Segmental biopsies of the ascending and sigmoid segments taken to evaluate  for microscopic colitis. Also, a stool specimen was submitted for microbiology laboratory.  COMPLICATIONS: None  CECAL WITHDRAWAL TIME:  8 minutes  IMPRESSION:  Normal ileocolonoscopy-status post segmental biopsy and stool sampling. Patient's symptoms are out of proportion to endoscopic findings.  RECOMMENDATIONS: Proceed with abdominal ultrasound. See EGD report. Followup on pathology.   _______________________________ eSigned:  R. Roetta Sessions, MD FACP [facility] 05/22/2013 1:01 PM   CC:

## 2013-05-22 NOTE — Discharge Instructions (Signed)
EGD Discharge instructions Please read the instructions outlined below and refer to this sheet in the next few weeks. These discharge instructions provide you with general information on caring for yourself after you leave the hospital. Your doctor may also give you specific instructions. While your treatment has been planned according to the most current medical practices available, unavoidable complications occasionally occur. If you have any problems or questions after discharge, please call your doctor. ACTIVITY  You may resume your regular activity but move at a slower pace for the next 24 hours.   Take frequent rest periods for the next 24 hours.   Walking will help expel (get rid of) the air and reduce the bloated feeling in your abdomen.   No driving for 24 hours (because of the anesthesia (medicine) used during the test).   You may shower.   Do not sign any important legal documents or operate any machinery for 24 hours (because of the anesthesia used during the test).  NUTRITION  Drink plenty of fluids.   You may resume your normal diet.   Begin with a light meal and progress to your normal diet.   Avoid alcoholic beverages for 24 hours or as instructed by your caregiver.  MEDICATIONS  You may resume your normal medications unless your caregiver tells you otherwise.  WHAT YOU CAN EXPECT TODAY  You may experience abdominal discomfort such as a feeling of fullness or gas pains.  FOLLOW-UP  Your doctor will discuss the results of your test with you.  SEEK IMMEDIATE MEDICAL ATTENTION IF ANY OF THE FOLLOWING OCCUR:  Excessive nausea (feeling sick to your stomach) and/or vomiting.   Severe abdominal pain and distention (swelling).   Trouble swallowing.   Temperature over 101 F (37.8 C).   Rectal bleeding or vomiting of blood.   Colonoscopy Discharge Instructions  Read the instructions outlined below and refer to this sheet in the next few weeks. These  discharge instructions provide you with general information on caring for yourself after you leave the hospital. Your doctor may also give you specific instructions. While your treatment has been planned according to the most current medical practices available, unavoidable complications occasionally occur. If you have any problems or questions after discharge, call Dr. Gala Romney at [phone]. ACTIVITY  You may resume your regular activity, but move at a slower pace for the next 24 hours.   Take frequent rest periods for the next 24 hours.   Walking will help get rid of the air and reduce the bloated feeling in your belly (abdomen).   No driving for 24 hours (because of the medicine (anesthesia) used during the test).    Do not sign any important legal documents or operate any machinery for 24 hours (because of the anesthesia used during the test).  NUTRITION  Drink plenty of fluids.   You may resume your normal diet as instructed by your doctor.   Begin with a light meal and progress to your normal diet. Heavy or fried foods are harder to digest and may make you feel sick to your stomach (nauseated).   Avoid alcoholic beverages for 24 hours or as instructed.  MEDICATIONS  You may resume your normal medications unless your doctor tells you otherwise.  WHAT YOU CAN EXPECT TODAY  Some feelings of bloating in the abdomen.   Passage of more gas than usual.   Spotting of blood in your stool or on the toilet paper.  IF YOU HAD POLYPS REMOVED DURING THE COLONOSCOPY:  No aspirin products for 7 days or as instructed.   No alcohol for 7 days or as instructed.   Eat a soft diet for the next 24 hours.  FINDING OUT THE RESULTS OF YOUR TEST Not all test results are available during your visit. If your test results are not back during the visit, make an appointment with your caregiver to find out the results. Do not assume everything is normal if you have not heard from your caregiver or the  medical facility. It is important for you to follow up on all of your test results.  SEEK IMMEDIATE MEDICAL ATTENTION IF:  You have more than a spotting of blood in your stool.   Your belly is swollen (abdominal distention).   You are nauseated or vomiting.   You have a temperature over 101.   You have abdominal pain or discomfort that is severe or gets worse throughout the day.    Proceed with abdominal ultrasound to further evaluate abdominal pain.  Further recommendations to follow pending review of pathology report

## 2013-05-22 NOTE — Interval H&P Note (Signed)
History and Physical Interval Note:  05/22/2013 12:05 PM  Danielle Swanson  has presented today for surgery, with the diagnosis of ABDOMINAL PAIN AND LOOSE STOOL  The various methods of treatment have been discussed with the patient and family. After consideration of risks, benefits and other options for treatment, the patient has consented to  Procedure(s) with comments: COLONOSCOPY WITH ESOPHAGOGASTRODUODENOSCOPY (EGD) (N/A) - 12:45 as a surgical intervention .  The patient's history has been reviewed, patient examined, no change in status, stable for surgery.  I have reviewed the patient's chart and labs.  Questions were answered to the patient's satisfaction.     Eula Listen  Patient seen and examined. Has not yet submitted stool studies. EGD and colonoscopy per plan. The risks, benefits, limitations, imponderables and alternatives regarding both EGD and colonoscopy have been reviewed with the patient. Questions have been answered. All parties agreeable.

## 2013-05-22 NOTE — H&P (View-Only) (Signed)
  Primary Care Physician:  [facility] Primary Gastroenterologist:  Dr. Rourk   Chief Complaint  Patient presents with  . Abdominal Pain    HPI:   [name] presents today at the request of the ED due to persistent abdominal pain. She has had multiple visits to the ED. She reports a complicated history to include breast cancer, ovarian cancer, possible lung cancer, and even possible liver cancer. However, it appears she has been followed by Dr. Burney in the past for lung nodules. I do not see documentation of liver cancer; her history is quite vague.   Notes Prilosec x 7 years. Reports history of possible PUD but NO EGD. Allergic to aspirin. Moved here a year ago from [place]. Dr. Burney in [place]: thoracic surgeon in [place]. No biopsy of liver nodules. States it was seen 4 years ago. ?ovarian cancer? No oncologist. Will be referred to oncologist in near future per patient.   No energy. Epigastric pain, burning, constant for 4 weeks. Has to use Phenergan in order to tolerate eating/drinking. Otherwise starts gagging. States never felt this sick, even with chemo. Belching. Diarrhea X 2 weeks. Pure acid. Too numerous to count. Goes right through 4 hours later. Threw up bright red blood. Specks in emesis. Low-volume hematochezia. No melena. No prior colonoscopy. No NSAIDs or aspirin powders. No recent abx. Well water. No sick contacts. Alternating Zofran and Phenergan. Prilosec BID. Severe reflux. No dysphagia.   Past Medical History  Diagnosis Date  . Bipolar 1 disorder   . PTSD (post-traumatic stress disorder)   . Fibromyalgia   . Mass of left breast     breast cancer?   . Arthritis   . Coronary artery disease   . Myocardial infarct   . Tumors   . Stomach ulcer   . Lung cancer   . Ovarian cancer     per patient  . GERD (gastroesophageal reflux disease)     Past Surgical History  Procedure Laterality Date  . Abdominal hysterectomy  2011  . Breast lumpectomy   2011    Current Outpatient Prescriptions  Medication Sig Dispense Refill  . albuterol-ipratropium (COMBIVENT) 18-103 MCG/ACT inhaler Inhale 2 puffs into the lungs every 6 (six) hours as needed for wheezing or shortness of breath.       . ALPRAZolam (XANAX) 1 MG tablet Take 1 tablet (1 mg total) by mouth 3 (three) times daily as needed for anxiety.  12 tablet  0  . desloratadine (CLARINEX) 5 MG tablet Take 1 tablet (5 mg total) by mouth daily.  30 tablet  0  . estradiol (ESTRACE) 1 MG tablet Take 1 tablet (1 mg total) by mouth daily.  12 tablet  0  . gabapentin (NEURONTIN) 600 MG tablet Take 1,200 mg by mouth 2 (two) times daily.      . lisinopril (PRINIVIL,ZESTRIL) 10 MG tablet Take 1 tablet (10 mg total) by mouth daily.  12 tablet  0  . omeprazole (PRILOSEC) 20 MG capsule Take 20 mg by mouth 2 (two) times daily.      . oxyCODONE (ROXICODONE) 15 MG immediate release tablet Take 1 tablet (15 mg total) by mouth every 6 (six) hours as needed for pain.  40 tablet  0  . promethazine (PHENERGAN) 25 MG tablet Take 1 tablet (25 mg total) by mouth every 6 (six) hours as needed for nausea.  15 tablet  0  . QUEtiapine (SEROQUEL) 300 MG tablet Take 300 mg by mouth at bedtime.         No current facility-administered medications for this visit.    Allergies as of 05/06/2013 - Review Complete 05/06/2013  Allergen Reaction Noted  . Aspirin Other (See Comments) 02/08/2011  . Ranitidine Swelling 04/26/2013  . Haldol [haloperidol decanoate] Other (See Comments) 02/08/2011  . Imitrex [sumatriptan]  07/23/2012  . Nsaids Other (See Comments) 02/08/2011  . Suboxone [buprenorphine hcl-naloxone hcl]  04/26/2013  . Sulfa antibiotics Other (See Comments) 02/08/2011  . Toradol [ketorolac tromethamine] Swelling 12/06/2012  . Tylenol [acetaminophen] Other (See Comments) 07/23/2012  . Ultram [tramadol hcl] Nausea Only 04/21/2011    Family History  Problem Relation Age of Onset  . Cancer Other   . Diabetes Other    . Colon cancer      uncles per patient  . Stomach cancer      uncles per patient    History   Social History  . Marital Status: Divorced    Spouse Name: N/A    Number of Children: N/A  . Years of Education: N/A   Occupational History  . disability    Social History Main Topics  . Smoking status: Current Every Day Smoker -- 0.50 packs/day    Types: Cigarettes  . Smokeless tobacco: Not on file     Comment: trying to wean off, quit.   . Alcohol Use: No  . Drug Use: No  . Sexual Activity: No   Other Topics Concern  . Not on file   Social History Narrative  . No narrative on file    Review of Systems: Gen: Denies any fever, chills, fatigue, weight loss, lack of appetite.  CV: Denies chest pain, heart palpitations, peripheral edema, syncope.  Resp: Denies shortness of breath at rest or with exertion. Denies wheezing or cough.  GI: Denies dysphagia or odynophagia. Denies jaundice, hematemesis, fecal incontinence. GU : Denies urinary burning, urinary frequency, urinary hesitancy MS: Denies joint pain, muscle weakness, cramps, or limitation of movement.  Derm: Denies rash, itching, dry skin Psych: Denies depression, anxiety, memory loss, and confusion Heme: Denies bruising, bleeding, and enlarged lymph nodes.  Physical Exam: BP 115/65  Pulse 62  Temp(Src) 97.6 F (36.4 C) (Oral)  Ht 5' 2" (1.575 m)  Wt 132 lb 12.8 oz (60.238 kg)  BMI 24.28 kg/m2 General:   Alert and oriented. Pleasant and cooperative. Well-nourished and well-developed.  Head:  Normocephalic and atraumatic. Eyes:  Without icterus, sclera clear and conjunctiva pink.  Ears:  Normal auditory acuity. Nose:  No deformity, discharge,  or lesions. Mouth:  No deformity or lesions, oral mucosa pink.  Neck:  Supple, without mass or thyromegaly. Lungs:  Clear to auscultation bilaterally. No wheezes, rales, or rhonchi. No distress.  Heart:  S1, S2 present without murmurs appreciated.  Abdomen:  +BS, soft, TTP  epigastric, LUQ and non-distended. No HSM noted. No guarding or rebound. No masses appreciated.  Rectal:  Deferred  Msk:  Symmetrical without gross deformities. Normal posture. Pulses:  Normal pulses noted. Extremities:  Without clubbing or edema. Neurologic:  Alert and  oriented x4;  grossly normal neurologically. Skin:  Intact without significant lesions or rashes. Cervical Nodes:  No significant cervical adenopathy. Psych:  Alert and cooperative. Normal mood and affect.  Lab Results  Component Value Date   WBC 10.7* 04/28/2013   HGB 13.8 04/28/2013   HCT 40.8 04/28/2013   MCV 92.7 04/28/2013   PLT 297 04/28/2013   Lab Results  Component Value Date   LIPASE 20 04/26/2013   Lab Results  Component Value Date     ALT 17 04/26/2013   AST 15 04/26/2013   ALKPHOS 77 04/26/2013   BILITOT 0.1* 04/26/2013   AAS 04/26/13: Large stool volume without obstruction.   

## 2013-05-22 NOTE — Op Note (Signed)
[facility] [address] [place], [address]   ENDOSCOPY PROCEDURE REPORT  PATIENT: Danielle Swanson, Danielle Swanson  MR#: [id number] BIRTHDATE: [birth year] , 42  yrs. old GENDER: Female ENDOSCOPIST: R.  Roetta Sessions, MD FACP FACG REFERRED BY:  Vivia Ewing, M.D. PROCEDURE DATE:  05/22/2013 PROCEDURE:     diagnostic EGD  INDICATIONS:    Epigastric pain  INFORMED CONSENT:   The risks, benefits, limitations, alternatives and imponderables have been discussed.  The potential for biopsy, esophogeal dilation, etc. have also been reviewed.  Questions have been answered.  All parties agreeable.  Please see the history and physical in the medical record for more information.  MEDICATIONS:  Versed 10 mg IV and Demerol 200 mg IV in divided doses. Phenergan 25 mg IV. Zofran 4 mg IV. Cetacaine spray.  DESCRIPTION OF PROCEDURE:   The EG-[id number] ([id number]) and EC-[id number] ([id number])  endoscope was introduced through the mouth and advanced to the second portion of the duodenum without difficulty or limitations.  The mucosal surfaces were surveyed very carefully during advancement of the scope and upon withdrawal.  Retroflexion view of the proximal stomach and esophagogastric junction was performed.      FINDINGS: Normal esophagus. Stomach empty. Normal gastric mucosa. Patent pylorus. Normal first and second portion of the duodenum.  THERAPEUTIC / DIAGNOSTIC MANEUVERS PERFORMED:  None   COMPLICATIONS:  None  IMPRESSION:  Normal EGD  RECOMMENDATIONS:     Will proceed with an abdominal ultrasound. See colonoscopy report.    _______________________________ R. Roetta Sessions, MD FACP [facility] [place] eSigned:  R. Roetta Sessions, MD FACP [facility] 05/22/2013 12:41 PM     CC:

## 2013-05-23 ENCOUNTER — Encounter (HOSPITAL_COMMUNITY): Payer: Self-pay | Admitting: Internal Medicine

## 2013-05-23 LAB — FECAL LACTOFERRIN, QUANT

## 2013-05-23 LAB — GIARDIA/CRYPTOSPORIDIUM SCREEN(EIA): Giardia Screen - EIA: NEGATIVE

## 2013-05-23 LAB — OVA AND PARASITE EXAMINATION: Ova and parasites: NONE SEEN

## 2013-05-26 LAB — STOOL CULTURE

## 2013-06-02 ENCOUNTER — Encounter: Payer: Self-pay | Admitting: Internal Medicine

## 2013-06-02 ENCOUNTER — Other Ambulatory Visit: Payer: Self-pay | Admitting: Internal Medicine

## 2013-06-02 ENCOUNTER — Telehealth: Payer: Self-pay

## 2013-06-02 DIAGNOSIS — R109 Unspecified abdominal pain: Secondary | ICD-10-CM

## 2013-06-02 NOTE — Telephone Encounter (Signed)
Pt called- she is out of her pain medication and has an appt this week with the pain clinic. She was requesting xanax rx from [place]. Advised pt she needed to call her pcp for xanax rx.   Pt wants AS to write her a letter for her to take to the pain clinic stating the Suboxone is causing her stomach issues. She is wanting them to change her medication.

## 2013-06-02 NOTE — Telephone Encounter (Signed)
I do not feel this is causing her symptoms.  Needs routine visit with [place] in follow-up, non-urgent.

## 2013-06-03 ENCOUNTER — Encounter: Payer: Self-pay | Admitting: Gastroenterology

## 2013-06-03 NOTE — Telephone Encounter (Signed)
Pt is aware of OV on 12/2 at 130 with AS and appt card was mailed

## 2013-06-03 NOTE — Telephone Encounter (Signed)
Susan, please schedule follow up ov. 

## 2013-06-04 ENCOUNTER — Ambulatory Visit (HOSPITAL_COMMUNITY): Payer: Medicaid Other

## 2013-06-06 ENCOUNTER — Ambulatory Visit (HOSPITAL_COMMUNITY): Payer: Medicaid Other | Attending: Internal Medicine

## 2013-06-08 ENCOUNTER — Emergency Department (HOSPITAL_COMMUNITY)
Admission: EM | Admit: 2013-06-08 | Discharge: 2013-06-08 | Disposition: A | Discharge status: Home / Self Care | Payer: Medicaid Other | Attending: Emergency Medicine | Admitting: Emergency Medicine

## 2013-06-08 ENCOUNTER — Encounter (HOSPITAL_COMMUNITY): Payer: Self-pay | Admitting: Emergency Medicine

## 2013-06-08 ENCOUNTER — Emergency Department (HOSPITAL_COMMUNITY)
Admission: EM | Admit: 2013-06-08 | Discharge: 2013-06-08 | Disposition: A | Discharge status: Home / Self Care | Payer: Medicaid Other | Source: Home / Self Care | Attending: Emergency Medicine | Admitting: Emergency Medicine

## 2013-06-08 DIAGNOSIS — I252 Old myocardial infarction: Secondary | ICD-10-CM | POA: Insufficient documentation

## 2013-06-08 DIAGNOSIS — Z8742 Personal history of other diseases of the female genital tract: Secondary | ICD-10-CM | POA: Insufficient documentation

## 2013-06-08 DIAGNOSIS — F19939 Other psychoactive substance use, unspecified with withdrawal, unspecified: Secondary | ICD-10-CM | POA: Insufficient documentation

## 2013-06-08 DIAGNOSIS — K219 Gastro-esophageal reflux disease without esophagitis: Secondary | ICD-10-CM | POA: Insufficient documentation

## 2013-06-08 DIAGNOSIS — Z85118 Personal history of other malignant neoplasm of bronchus and lung: Secondary | ICD-10-CM | POA: Insufficient documentation

## 2013-06-08 DIAGNOSIS — F4310 Post-traumatic stress disorder, unspecified: Secondary | ICD-10-CM | POA: Insufficient documentation

## 2013-06-08 DIAGNOSIS — Z79899 Other long term (current) drug therapy: Secondary | ICD-10-CM | POA: Insufficient documentation

## 2013-06-08 DIAGNOSIS — F17200 Nicotine dependence, unspecified, uncomplicated: Secondary | ICD-10-CM | POA: Insufficient documentation

## 2013-06-08 DIAGNOSIS — Z8543 Personal history of malignant neoplasm of ovary: Secondary | ICD-10-CM | POA: Insufficient documentation

## 2013-06-08 DIAGNOSIS — F411 Generalized anxiety disorder: Secondary | ICD-10-CM | POA: Insufficient documentation

## 2013-06-08 DIAGNOSIS — Z888 Allergy status to other drugs, medicaments and biological substances status: Secondary | ICD-10-CM | POA: Insufficient documentation

## 2013-06-08 DIAGNOSIS — F319 Bipolar disorder, unspecified: Secondary | ICD-10-CM | POA: Insufficient documentation

## 2013-06-08 DIAGNOSIS — Z8719 Personal history of other diseases of the digestive system: Secondary | ICD-10-CM | POA: Insufficient documentation

## 2013-06-08 DIAGNOSIS — IMO0001 Reserved for inherently not codable concepts without codable children: Secondary | ICD-10-CM | POA: Insufficient documentation

## 2013-06-08 DIAGNOSIS — F1110 Opioid abuse, uncomplicated: Secondary | ICD-10-CM | POA: Insufficient documentation

## 2013-06-08 DIAGNOSIS — Z8505 Personal history of malignant neoplasm of liver: Secondary | ICD-10-CM | POA: Insufficient documentation

## 2013-06-08 DIAGNOSIS — F1123 Opioid dependence with withdrawal: Secondary | ICD-10-CM

## 2013-06-08 DIAGNOSIS — I2510 Atherosclerotic heart disease of native coronary artery without angina pectoris: Secondary | ICD-10-CM | POA: Insufficient documentation

## 2013-06-08 DIAGNOSIS — M129 Arthropathy, unspecified: Secondary | ICD-10-CM | POA: Insufficient documentation

## 2013-06-08 DIAGNOSIS — Z882 Allergy status to sulfonamides status: Secondary | ICD-10-CM | POA: Insufficient documentation

## 2013-06-08 MED ORDER — ALPRAZOLAM 0.25 MG PO TABS
0.5000 mg | ORAL_TABLET | Freq: Once | ORAL | Status: AC
  Administered 2013-06-08: 0.5 mg via ORAL
  Filled 2013-06-08: qty 2

## 2013-06-08 MED ORDER — ONDANSETRON 8 MG PO TBDP
8.0000 mg | ORAL_TABLET | Freq: Once | ORAL | Status: AC
  Administered 2013-06-08: 8 mg via ORAL
  Filled 2013-06-08: qty 1

## 2013-06-08 MED ORDER — ALPRAZOLAM 0.5 MG PO TABS: 0.5000 mg | ORAL_TABLET | Freq: Every evening | ORAL | Status: DC | PRN

## 2013-06-08 MED ORDER — ONDANSETRON HCL 8 MG PO TABS: 8.0000 mg | ORAL_TABLET | Freq: Three times a day (TID) | ORAL | Status: DC | PRN

## 2013-06-08 NOTE — ED Notes (Signed)
Discharge instructions reviewed with patient. Patient requesting pain medication. MD aware, no rx provided. Patient requesting "Dr Adriana Simas" when notified that she would not receive narcotic prescription today. States she is unable to see pain management until the 25th. RN informed patient to call referring MD for pain management until she could see pain clinic. Patient upset and left department with steady gait. No distress upon departure.

## 2013-06-08 NOTE — ED Provider Notes (Signed)
CSN: [id number]     Arrival date & time 06/08/13  0806 History  This chart was scribed for Joya Gaskins, MD by Bennett Scrape, ED Scribe. This patient was seen in room APA14/APA14 and the patient's care was started at 8:16 AM.   CC: HA  Patient is a 42 y.o. female presenting with headaches. The history is provided by the patient. No language interpreter was used.  Headache Pain location:  Generalized Radiates to:  Does not radiate Severity currently:  8/10 Onset quality:  Gradual Duration:  2 days Timing:  Constant Progression:  Unchanged Chronicity:  New Associated symptoms: diarrhea, myalgias, nausea and vomiting   Associated symptoms: no abdominal pain, no back pain and no fever     HPI Comments: Danielle Swanson is a 42 y.o. female with a h/o lung CA who presents to the Emergency Department complaining of 2 days of diffuse HA with associated myalgias, emesis and diarrhea that started after her last pain medication dose. She states that she is currently prescribed Suboxone but states that it has been causing her emesis and diarrhea since being prescribed it. She states that she was seen in the ED one month ago for the same and was prescribed Roxicodone with improvement. She was told to alternate between Zofran and phenergan with improvement until she also ran out of those medications 2 days ago. She is currently here today requesting nausea symptom treatment. She denies denies any hematemesis or hematochezia. She denies being on chemo or radiation therapy currently stating that she is still waiting on a liver biopsy result. She reports prior normal colonoscopies and endoscopies.   She has been without pain meds for over 2 days   This is her 11th visit in the past 6 months.  Past Medical History  Diagnosis Date  . Bipolar 1 disorder   . PTSD (post-traumatic stress disorder)   . Fibromyalgia   . Mass of left breast     breast cancer?   Marland Kitchen Arthritis   . Coronary artery disease    . Myocardial infarct   . Tumors   . Stomach ulcer   . Lung cancer   . Ovarian cancer     per patient  . GERD (gastroesophageal reflux disease)    Past Surgical History  Procedure Laterality Date  . Abdominal hysterectomy  2011  . Breast lumpectomy  2011  . Colonoscopy with esophagogastroduodenoscopy (egd) N/A 05/22/2013    Procedure: COLONOSCOPY WITH ESOPHAGOGASTRODUODENOSCOPY (EGD);  Surgeon: Corbin Ade, MD;  Location: [facility];  Service: Endoscopy;  Laterality: N/A;  12:45   Family History  Problem Relation Age of Onset  . Cancer Other   . Diabetes Other   . Colon cancer      uncles per patient  . Stomach cancer      uncles per patient   History  Substance Use Topics  . Smoking status: Current Every Day Smoker -- 0.50 packs/day    Types: Cigarettes  . Smokeless tobacco: Not on file     Comment: trying to wean off, quit.   . Alcohol Use: No   OB History   Grav Para Term Preterm Abortions TAB SAB Ect Mult Living   2 1 1  1  1   1      Review of Systems  Constitutional: Negative for fever.  Gastrointestinal: Positive for nausea, vomiting and diarrhea. Negative for abdominal pain.  Musculoskeletal: Positive for myalgias. Negative for back pain.  Neurological: Positive for headaches.  All other systems reviewed and are negative.    Allergies  Aspirin; Ranitidine; Haldol; Imitrex; Nsaids; Suboxone; Sulfa antibiotics; Toradol; Tylenol; and Ultram  Home Medications   Current Outpatient Rx  Name  Route  Sig  Dispense  Refill  . albuterol-ipratropium (COMBIVENT) 18-103 MCG/ACT inhaler   Inhalation   Inhale 2 puffs into the lungs every 6 (six) hours as needed for wheezing or shortness of breath.          . ALPRAZolam (XANAX) 1 MG tablet   Oral   Take 1 tablet (1 mg total) by mouth 3 (three) times daily as needed for anxiety.   12 tablet   0   . cyclobenzaprine (FLEXERIL) 10 MG tablet   Oral   Take 10 mg by mouth 2 (two) times daily.         Marland Kitchen  desloratadine (CLARINEX) 5 MG tablet   Oral   Take 1 tablet (5 mg total) by mouth daily.   30 tablet   0   . dicyclomine (BENTYL) 10 MG capsule   Oral   Take 1 capsule (10 mg total) by mouth 4 (four) times daily -  before meals and at bedtime.   120 capsule   3   . estradiol (ESTRACE) 1 MG tablet   Oral   Take 1 tablet (1 mg total) by mouth daily.   12 tablet   0   . gabapentin (NEURONTIN) 600 MG tablet   Oral   Take 1,200 mg by mouth 2 (two) times daily.         Marland Kitchen lisinopril (PRINIVIL,ZESTRIL) 10 MG tablet   Oral   Take 1 tablet (10 mg total) by mouth daily.   12 tablet   0   . Multiple Vitamin (MULTIVITAMIN WITH MINERALS) TABS tablet   Oral   Take 1 tablet by mouth daily.         Marland Kitchen omeprazole (PRILOSEC) 20 MG capsule   Oral   Take 20 mg by mouth 2 (two) times daily.         . ondansetron (ZOFRAN) 4 MG tablet   Oral   Take 1 tablet (4 mg total) by mouth every 8 (eight) hours as needed for nausea.   30 tablet   1   . oxyCODONE (ROXICODONE) 15 MG immediate release tablet   Oral   Take 1 tablet (15 mg total) by mouth every 6 (six) hours as needed for pain.   40 tablet   0   . polyethylene glycol-electrolytes (TRILYTE) 420 G solution   Oral   Take 4,000 mLs by mouth as directed.   4000 mL   0   . promethazine (PHENERGAN) 25 MG tablet   Oral   Take 1 tablet (25 mg total) by mouth every 6 (six) hours as needed for nausea.   15 tablet   0   . QUEtiapine (SEROQUEL) 300 MG tablet   Oral   Take 300 mg by mouth at bedtime.          Triage Vitals: BP 120/81  Pulse 94  Temp(Src) 97.9 F (36.6 C) (Oral)  Resp 16  Ht 5\' 3"  (1.6 m)  Wt 119 lb (53.978 kg)  BMI 21.09 kg/m2  SpO2 100%  Physical Exam  Nursing note and vitals reviewed.  CONSTITUTIONAL: Well developed/well nourished, pt drinking mountain dew HEAD: Normocephalic/atraumatic EYES: EOMI/PERRL ENMT: Mucous membranes moist NECK: supple no meningeal signs SPINE:entire spine  nontender CV: S1/S2 noted, no murmurs/rubs/gallops noted LUNGS: Lungs are  clear to auscultation bilaterally, no apparent distress ABDOMEN: soft, nontender, no rebound or guarding GU:no cva tenderness NEURO: Pt is awake/alert, moves all extremitiesx4, gait without ataxia EXTREMITIES: pulses normal, full ROM SKIN: warm, color normal PSYCH: no abnormalities of mood noted  ED Course  Procedures (including critical care time)  Medications  ondansetron (ZOFRAN-ODT) disintegrating tablet 8 mg (not administered)    DIAGNOSTIC STUDIES: Oxygen Saturation is 100% on room air, normal by my interpretation.    COORDINATION OF CARE: 8:19 AM-Discussed treatment plan which includes Zofran and discharge with pt at bedside and pt agreed to plan. No pain medication was offered. Pt was instructed to f/u with her chronic pain doctor.  Pt with likely narcotic withdrawal - no pain meds in over 48 hours with myalgias/vomiting/diarrhea She appears stable, no distress, no neuro deficits, ambulatory  Stable for d/c home  EKG Interpretation   None       MDM   1. Opiate withdrawal    Nursing notes including past medical history and social history reviewed and considered in documentation   I personally performed the services described in this documentation, which was scribed in my presence. The recorded information has been reviewed and is accurate.      Joya Gaskins, MD 06/08/13 [phone]

## 2013-06-08 NOTE — ED Notes (Signed)
Pt reports has lung cancer  And liver cancer and is waiting biopsy reports  Before starting chemo.  Pt c/o headache and generalized body aches x 2 days.

## 2013-06-08 NOTE — ED Notes (Signed)
Pt from home, c/o pain due to lack of medication. States she is changing pain mgmt clinics and is out of medications.

## 2013-06-08 NOTE — ED Provider Notes (Signed)
CSN: [id number]     Arrival date & time 06/08/13  0917 History  This chart was scribed for non-physician practitioner, Irish Elders, NP working with Bonnita Levan. Bernette Mayers, MD by Greggory Stallion, ED scribe. This patient was seen in room TR06C/TR06C and the patient's care was started at 10:12 AM.   Chief Complaint  Patient presents with  . Pain   The history is provided by the patient. No language interpreter was used.   HPI Comments: Danielle Swanson is a 42 y.o. female with history of lung cancer, liver cancer and fibromyalgia who presents to the Emergency Department complaining of generalized pain due to being out of her medications. Pt states is is changed pain management clinicals and is currently out of all of her medications. She has an appointment scheduled with pain management on 11/25. Pt states she is currently waiting on biopsy results to check for other cancers.   Past Medical History  Diagnosis Date  . Bipolar 1 disorder   . PTSD (post-traumatic stress disorder)   . Fibromyalgia   . Mass of left breast     breast cancer?   Marland Kitchen Arthritis   . Coronary artery disease   . Myocardial infarct   . Tumors   . Stomach ulcer   . Lung cancer   . Ovarian cancer     per patient  . GERD (gastroesophageal reflux disease)    Past Surgical History  Procedure Laterality Date  . Abdominal hysterectomy  2011  . Breast lumpectomy  2011  . Colonoscopy with esophagogastroduodenoscopy (egd) N/A 05/22/2013    Procedure: COLONOSCOPY WITH ESOPHAGOGASTRODUODENOSCOPY (EGD);  Surgeon: Corbin Ade, MD;  Location: [facility];  Service: Endoscopy;  Laterality: N/A;  12:45   Family History  Problem Relation Age of Onset  . Cancer Other   . Diabetes Other   . Colon cancer      uncles per patient  . Stomach cancer      uncles per patient   History  Substance Use Topics  . Smoking status: Current Every Day Smoker -- 0.50 packs/day    Types: Cigarettes  . Smokeless tobacco: Not on file      Comment: trying to wean off, quit.   . Alcohol Use: No   OB History   Grav Para Term Preterm Abortions TAB SAB Ect Mult Living   2 1 1  1  1   1      Review of Systems  Musculoskeletal: Positive for arthralgias and myalgias.  All other systems reviewed and are negative.    Allergies  Aspirin; Ranitidine; Haldol; Imitrex; Nsaids; Suboxone; Sulfa antibiotics; Toradol; Tylenol; and Ultram  Home Medications   Current Outpatient Rx  Name  Route  Sig  Dispense  Refill  . albuterol-ipratropium (COMBIVENT) 18-103 MCG/ACT inhaler   Inhalation   Inhale 2 puffs into the lungs every 6 (six) hours as needed for wheezing or shortness of breath.          . ALPRAZolam (XANAX) 1 MG tablet   Oral   Take 1 tablet (1 mg total) by mouth 3 (three) times daily as needed for anxiety.   12 tablet   0   . cyclobenzaprine (FLEXERIL) 10 MG tablet   Oral   Take 10 mg by mouth 2 (two) times daily.         Marland Kitchen desloratadine (CLARINEX) 5 MG tablet   Oral   Take 1 tablet (5 mg total) by mouth daily.   30 tablet  0   . dicyclomine (BENTYL) 10 MG capsule   Oral   Take 1 capsule (10 mg total) by mouth 4 (four) times daily -  before meals and at bedtime.   120 capsule   3   . estradiol (ESTRACE) 1 MG tablet   Oral   Take 1 tablet (1 mg total) by mouth daily.   12 tablet   0   . gabapentin (NEURONTIN) 600 MG tablet   Oral   Take 1,200 mg by mouth 2 (two) times daily.         Marland Kitchen lisinopril (PRINIVIL,ZESTRIL) 10 MG tablet   Oral   Take 1 tablet (10 mg total) by mouth daily.   12 tablet   0   . Multiple Vitamin (MULTIVITAMIN WITH MINERALS) TABS tablet   Oral   Take 1 tablet by mouth daily.         Marland Kitchen omeprazole (PRILOSEC) 20 MG capsule   Oral   Take 20 mg by mouth 2 (two) times daily.         . ondansetron (ZOFRAN) 8 MG tablet   Oral   Take 1 tablet (8 mg total) by mouth every 8 (eight) hours as needed for nausea.   8 tablet   0   . polyethylene glycol-electrolytes (TRILYTE)  420 G solution   Oral   Take 4,000 mLs by mouth as directed.   4000 mL   0   . promethazine (PHENERGAN) 25 MG tablet   Oral   Take 1 tablet (25 mg total) by mouth every 6 (six) hours as needed for nausea.   15 tablet   0   . QUEtiapine (SEROQUEL) 300 MG tablet   Oral   Take 300 mg by mouth at bedtime.          BP 111/76  Pulse 96  Temp(Src) 97.8 F (36.6 C) (Oral)  Resp 20  SpO2 98%  Physical Exam  Nursing note and vitals reviewed. Constitutional: She is oriented to person, place, and time. She appears well-developed and well-nourished. No distress.  HENT:  Head: Normocephalic and atraumatic.  Eyes: EOM are normal.  Neck: Neck supple. No tracheal deviation present.  Cardiovascular: Normal rate, regular rhythm and normal heart sounds.   No murmur heard. Pulmonary/Chest: Effort normal and breath sounds normal. No respiratory distress. She has no wheezes. She has no rales.  Musculoskeletal: Normal range of motion.  Neurological: She is alert and oriented to person, place, and time.  Skin: Skin is warm and dry.  Psychiatric: She has a normal mood and affect. Her behavior is normal.    ED Course  Procedures (including critical care time)  DIAGNOSTIC STUDIES: Oxygen Saturation is 98% on RA, normal by my interpretation.    COORDINATION OF CARE: 10:16 AM-Discussed treatment plan which includes Zofran and Xanax with pt at bedside and pt agreed to plan. Pt will not be given any opiates or narcotics for pain control.   Labs Review Labs Reviewed - No data to display Imaging Review No results found.  EKG Interpretation   None       MDM   1. Opiate withdrawal     Recently seen at [facility] this morning. Pt is not in any acute distress except that she is withdrawing from opiates. No chest pain, difficulty breathing or shortness of breath. Xanax given here for anxiety but no narcotics prescribed.    I personally performed the services described in this  documentation, which was scribed in my  presence. The recorded information has been reviewed and is accurate.   Irish Elders, NP 06/08/13 1556

## 2013-06-10 ENCOUNTER — Telehealth: Payer: Self-pay | Admitting: General Practice

## 2013-06-10 NOTE — ED Provider Notes (Signed)
Medical screening examination/treatment/procedure(s) were performed by non-physician practitioner and as supervising physician I was immediately available for consultation/collaboration.  EKG Interpretation   None         Gottlieb Zuercher B. Caliyah Sieh, MD 06/10/13 0700 

## 2013-06-10 NOTE — Telephone Encounter (Signed)
Returning pt's call, no answer, unable to leave message.

## 2013-06-24 ENCOUNTER — Ambulatory Visit: Payer: Medicaid Other | Admitting: Gastroenterology

## 2013-06-24 ENCOUNTER — Telehealth: Payer: Self-pay | Admitting: Gastroenterology

## 2013-06-24 NOTE — Telephone Encounter (Signed)
Pt was a no show

## 2013-07-01 NOTE — Telephone Encounter (Signed)
Please send letter for f/u.  PATIENT WILL NEED E30 slot

## 2013-07-08 ENCOUNTER — Encounter (HOSPITAL_COMMUNITY): Payer: Self-pay | Admitting: Emergency Medicine

## 2013-07-08 ENCOUNTER — Emergency Department (HOSPITAL_COMMUNITY): Payer: Medicaid Other

## 2013-07-08 ENCOUNTER — Emergency Department (HOSPITAL_COMMUNITY)
Admission: EM | Admit: 2013-07-08 | Discharge: 2013-07-08 | Disposition: A | Discharge status: Home / Self Care | Payer: Medicaid Other | Attending: Emergency Medicine | Admitting: Emergency Medicine

## 2013-07-08 ENCOUNTER — Encounter: Payer: Self-pay | Admitting: Gastroenterology

## 2013-07-08 DIAGNOSIS — Z8711 Personal history of peptic ulcer disease: Secondary | ICD-10-CM | POA: Insufficient documentation

## 2013-07-08 DIAGNOSIS — F411 Generalized anxiety disorder: Secondary | ICD-10-CM | POA: Insufficient documentation

## 2013-07-08 DIAGNOSIS — R450 Nervousness: Secondary | ICD-10-CM | POA: Insufficient documentation

## 2013-07-08 DIAGNOSIS — Q674 Other congenital deformities of skull, face and jaw: Secondary | ICD-10-CM | POA: Insufficient documentation

## 2013-07-08 DIAGNOSIS — Z79899 Other long term (current) drug therapy: Secondary | ICD-10-CM | POA: Insufficient documentation

## 2013-07-08 DIAGNOSIS — M129 Arthropathy, unspecified: Secondary | ICD-10-CM | POA: Insufficient documentation

## 2013-07-08 DIAGNOSIS — R110 Nausea: Secondary | ICD-10-CM | POA: Insufficient documentation

## 2013-07-08 DIAGNOSIS — IMO0001 Reserved for inherently not codable concepts without codable children: Secondary | ICD-10-CM

## 2013-07-08 DIAGNOSIS — F319 Bipolar disorder, unspecified: Secondary | ICD-10-CM | POA: Insufficient documentation

## 2013-07-08 DIAGNOSIS — R4789 Other speech disturbances: Secondary | ICD-10-CM | POA: Insufficient documentation

## 2013-07-08 DIAGNOSIS — M6281 Muscle weakness (generalized): Secondary | ICD-10-CM | POA: Insufficient documentation

## 2013-07-08 DIAGNOSIS — M797 Fibromyalgia: Secondary | ICD-10-CM

## 2013-07-08 DIAGNOSIS — I252 Old myocardial infarction: Secondary | ICD-10-CM | POA: Insufficient documentation

## 2013-07-08 DIAGNOSIS — R209 Unspecified disturbances of skin sensation: Secondary | ICD-10-CM

## 2013-07-08 DIAGNOSIS — I10 Essential (primary) hypertension: Secondary | ICD-10-CM

## 2013-07-08 DIAGNOSIS — R42 Dizziness and giddiness: Secondary | ICD-10-CM | POA: Insufficient documentation

## 2013-07-08 DIAGNOSIS — Z9889 Other specified postprocedural states: Secondary | ICD-10-CM | POA: Insufficient documentation

## 2013-07-08 DIAGNOSIS — R200 Anesthesia of skin: Secondary | ICD-10-CM

## 2013-07-08 DIAGNOSIS — F17200 Nicotine dependence, unspecified, uncomplicated: Secondary | ICD-10-CM | POA: Insufficient documentation

## 2013-07-08 DIAGNOSIS — K219 Gastro-esophageal reflux disease without esophagitis: Secondary | ICD-10-CM | POA: Diagnosis present

## 2013-07-08 DIAGNOSIS — R259 Unspecified abnormal involuntary movements: Secondary | ICD-10-CM | POA: Insufficient documentation

## 2013-07-08 DIAGNOSIS — R29898 Other symptoms and signs involving the musculoskeletal system: Secondary | ICD-10-CM

## 2013-07-08 DIAGNOSIS — F4310 Post-traumatic stress disorder, unspecified: Secondary | ICD-10-CM | POA: Insufficient documentation

## 2013-07-08 DIAGNOSIS — Z85118 Personal history of other malignant neoplasm of bronchus and lung: Secondary | ICD-10-CM | POA: Insufficient documentation

## 2013-07-08 DIAGNOSIS — Z8543 Personal history of malignant neoplasm of ovary: Secondary | ICD-10-CM | POA: Insufficient documentation

## 2013-07-08 DIAGNOSIS — I2510 Atherosclerotic heart disease of native coronary artery without angina pectoris: Secondary | ICD-10-CM | POA: Insufficient documentation

## 2013-07-08 DIAGNOSIS — F29 Unspecified psychosis not due to a substance or known physiological condition: Secondary | ICD-10-CM | POA: Insufficient documentation

## 2013-07-08 LAB — URINALYSIS, ROUTINE W REFLEX MICROSCOPIC
Glucose, UA: NEGATIVE mg/dL
Hgb urine dipstick: NEGATIVE
Ketones, ur: NEGATIVE mg/dL
Leukocytes, UA: NEGATIVE
Nitrite: NEGATIVE
Specific Gravity, Urine: 1.01 (ref 1.005–1.030)
Urobilinogen, UA: 0.2 mg/dL (ref 0.0–1.0)
pH: 6 (ref 5.0–8.0)

## 2013-07-08 LAB — CBC WITH DIFFERENTIAL/PLATELET
Basophils Absolute: 0.1 10*3/uL (ref 0.0–0.1)
Eosinophils Relative: 8 % — ABNORMAL HIGH (ref 0–5)
Lymphocytes Relative: 51 % — ABNORMAL HIGH (ref 12–46)
Lymphs Abs: 3.6 10*3/uL (ref 0.7–4.0)
MCV: 93.7 fL (ref 78.0–100.0)
Monocytes Relative: 7 % (ref 3–12)
Neutro Abs: 2.3 10*3/uL (ref 1.7–7.7)
Platelets: 357 10*3/uL (ref 150–400)
RBC: 4.15 MIL/uL (ref 3.87–5.11)
WBC: 7 10*3/uL (ref 4.0–10.5)

## 2013-07-08 LAB — BASIC METABOLIC PANEL
BUN: 10 mg/dL (ref 6–23)
CO2: 27 mEq/L (ref 19–32)
Chloride: 106 mEq/L (ref 96–112)
Glucose, Bld: 84 mg/dL (ref 70–99)
Potassium: 3.9 mEq/L (ref 3.5–5.1)
Sodium: 142 mEq/L (ref 135–145)

## 2013-07-08 MED ORDER — FENTANYL CITRATE 0.05 MG/ML IJ SOLN
50.0000 ug | Freq: Once | INTRAMUSCULAR | Status: AC
  Administered 2013-07-08: 50 ug via INTRAVENOUS
  Filled 2013-07-08: qty 2

## 2013-07-08 MED ORDER — SODIUM CHLORIDE 0.9 % IV BOLUS (SEPSIS)
500.0000 mL | Freq: Once | INTRAVENOUS | Status: AC
  Administered 2013-07-08: 1000 mL via INTRAVENOUS

## 2013-07-08 MED ORDER — ONDANSETRON HCL 4 MG/2ML IJ SOLN
4.0000 mg | Freq: Once | INTRAMUSCULAR | Status: AC
  Administered 2013-07-08: 4 mg via INTRAVENOUS
  Filled 2013-07-08: qty 2

## 2013-07-08 NOTE — ED Notes (Signed)
Pt complain of left side of her face and tongue feeling numb. States she needs to chew on the right side

## 2013-07-08 NOTE — ED Provider Notes (Addendum)
CSN: [id number]     Arrival date & time 07/08/13  7829 History  This chart was scribed for Danielle Hutching, MD,  by Ashley Jacobs, ED Scribe. The patient was seen in room APA18/APA18 and the patient's care was started at 9:26 AM.  First MD Initiated Contact with Patient 07/08/13 0901     Chief Complaint  Patient presents with  . Numbness   (Consider location/radiation/quality/duration/timing/severity/associated sxs/prior Treatment) The history is provided by the patient and medical records. No language interpreter was used.   HPI Comments: Danielle Swanson is a 42 y.o. female who presents to the Emergency Department complaining of numbness that presented six hours ago. She felt dizzy and fell into her bookshelf. Pt had left arm numbness and tingling in her right leg with slurred speech. Pt has a facial drooping and numbness to the left side of her face. She states her tongue feels "thick and she feels nauseated. Pt is experiencing head pain which feels as thought it is about to "explode".  She reports her left arm is weak and numb presently. Her right leg is weak as well. Pt has lung cancer that was diagnosed two years ago. 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 Pt states feeling scared.  Pt's PCP is Fleet Contras, MD at [facility] Past Medical History  Diagnosis Date  . Bipolar 1 disorder   . PTSD (post-traumatic stress disorder)   . Fibromyalgia   . Mass of left breast     breast cancer?   Marland Kitchen Arthritis   . Coronary artery disease   . Myocardial infarct   . Tumors   . Stomach ulcer   . Lung cancer   . Ovarian cancer     per patient  . GERD (gastroesophageal reflux disease)    Past Surgical History  Procedure Laterality Date  . Abdominal hysterectomy  2011  . Breast lumpectomy  2011  . Colonoscopy with esophagogastroduodenoscopy (egd) N/A 05/22/2013    FAO:ZHYQMV EGD/Normal ileocolonoscopy-status  post segmental biopsy and stool sampling. Patient's symptoms are out of proportion to endoscopic findings  . Hand surgery     Family History  Problem Relation Age of Onset  . Cancer Other   . Diabetes Other   . Colon cancer      uncles per patient  . Stomach cancer      uncles per patient   History  Substance Use Topics  . Smoking status: Current Every Day Smoker -- 0.50 packs/day    Types: Cigarettes  . Smokeless tobacco: Not on file     Comment: trying to wean off, quit.   . Alcohol Use: No   OB History   Grav Para Term Preterm Abortions TAB SAB Ect Mult Living   2 1 1  1  1   1      Review of Systems  Gastrointestinal: Positive for nausea.  Musculoskeletal: Positive for gait problem.  Neurological: Positive for dizziness, tremors, facial asymmetry, speech difficulty, weakness and numbness.  Psychiatric/Behavioral: Positive for confusion. The patient is nervous/anxious.   All other systems reviewed and are negative.    Allergies  Aspirin; Ranitidine; Haldol; Imitrex; Nsaids; Suboxone; Sulfa antibiotics; Toradol; Tylenol; and Ultram  Home Medications   Current Outpatient Rx  Name  Route  Sig  Dispense  Refill  . albuterol-ipratropium (COMBIVENT) 18-103 MCG/ACT inhaler   Inhalation   Inhale 2 puffs into the lungs every 6 (six) hours  as needed for wheezing or shortness of breath.          . ALPRAZolam (XANAX) 0.5 MG tablet   Oral   Take 1 tablet (0.5 mg total) by mouth at bedtime as needed for anxiety.   10 tablet   0   . ALPRAZolam (XANAX) 1 MG tablet   Oral   Take 1 tablet (1 mg total) by mouth 3 (three) times daily as needed for anxiety.   12 tablet   0   . desloratadine (CLARINEX) 5 MG tablet   Oral   Take 1 tablet (5 mg total) by mouth daily.   30 tablet   0   . estradiol (ESTRACE) 1 MG tablet   Oral   Take 1 tablet (1 mg total) by mouth daily.   12 tablet   0   . gabapentin (NEURONTIN) 600 MG tablet   Oral   Take 600 mg by mouth 2 (two)  times daily.          Marland Kitchen lisinopril (PRINIVIL,ZESTRIL) 10 MG tablet   Oral   Take 1 tablet (10 mg total) by mouth daily.   12 tablet   0   . Multiple Vitamin (MULTIVITAMIN WITH MINERALS) TABS tablet   Oral   Take 1 tablet by mouth daily.         Marland Kitchen omeprazole (PRILOSEC) 20 MG capsule   Oral   Take 20 mg by mouth 2 (two) times daily.         . Oxycodone HCl 20 MG TABS   Oral   Take 1 tablet by mouth 4 (four) times daily.         . QUEtiapine (SEROQUEL) 300 MG tablet   Oral   Take 150 mg by mouth at bedtime.           BP 120/67  Pulse 86  Temp(Src) 97.6 F (36.4 C)  Resp 12  Ht 5' 3.5" (1.613 m)  Wt 135 lb (61.236 kg)  BMI 23.54 kg/m2  SpO2 98% Physical Exam  Nursing note and vitals reviewed. Constitutional: She is oriented to person, place, and time. She appears well-developed and well-nourished.  HENT:  Head: Normocephalic and atraumatic.  Eyes: Conjunctivae and EOM are normal. Pupils are equal, round, and reactive to light.  Neck: Normal range of motion. Neck supple.  Cardiovascular: Normal rate, regular rhythm and normal heart sounds.   Pulmonary/Chest: Effort normal and breath sounds normal.  Abdominal: Soft. Bowel sounds are normal.  Musculoskeletal: Normal range of motion.  Left arm and right leg weakness   Neurological: She is alert and oriented to person, place, and time.  Skin: Skin is warm and dry.  Psychiatric:  Slightly slurred speech. AOx2 ( unable to recall date)    ED Course  Procedures (including critical care time) DIAGNOSTIC STUDIES: Oxygen Saturation is 98% on room air, normal  by my interpretation.    COORDINATION OF CARE: 9:29AM  Discussed course of care with pt which includes head CT, EKG and laboratory tests . Pt understands and agrees. Labs Review Labs Reviewed  CBC WITH DIFFERENTIAL  BASIC METABOLIC PANEL  URINALYSIS, ROUTINE W REFLEX MICROSCOPIC   Imaging Review Ct Head Wo Contrast  07/08/2013   CLINICAL DATA:   42 year old female with left upper extremity numbness. Head pressure. Initial encounter.  EXAM: CT HEAD WITHOUT CONTRAST  TECHNIQUE: Contiguous axial images were obtained from the base of the skull through the vertex without intravenous contrast.  COMPARISON:  10/19/2011 and earlier.  FINDINGS: Mild mucosal thickening in the sphenoid sinuses has increased. Other Visualized paranasal sinuses and mastoids are clear. No acute osseous abnormality identified. Visualized orbits and scalp soft tissues are within normal limits. Mild *INSERT* calyx  Stable cerebral volume which appears normal for age. No midline shift, ventriculomegaly, mass effect, evidence of mass lesion, intracranial hemorrhage or evidence of cortically based acute infarction. Gray-white matter differentiation is within normal limits throughout the brain. No suspicious intracranial vascular hyperdensity.  IMPRESSION: Stable and normal noncontrast CT appearance of the brain.   Electronically Signed   By: Augusto Gamble M.D.   On: 07/08/2013 09:19   Mr Maxine Glenn Head Wo Contrast  07/08/2013   CLINICAL DATA:  Head pressure. Left arm numbness and weakness. Breast cancer  EXAM: MRI HEAD WITHOUT CONTRAST  MRA HEAD WITHOUT CONTRAST  TECHNIQUE: Multiplanar, multiecho pulse sequences of the brain and surrounding structures were obtained without intravenous contrast. Angiographic images of the head were obtained using MRA technique without contrast.  COMPARISON:  CT head today  FINDINGS: MRI HEAD FINDINGS  Ventricle size is normal. Small hyperintensity in the right frontal temporal white matter. Remaining white matter is normal.  Negative for acute infarct.  Negative for hemorrhage or mass.  Brainstem and cerebellum are normal. Vessels at the base of the brain are patent.  Sinusitis with mucosal thickening in the paranasal sinuses.  Negative for edema or midline shift.  MRA HEAD FINDINGS  Both vertebral arteries are patent to the basilar. Right posterior inferior  cerebellar artery is patent. Left posterior inferior cerebellar artery is not visualized. Bilateral AICA is patent. Bilateral superior cerebellar and posterior cerebral arteries are patent without stenosis  Internal carotid artery is widely patent. Anterior and middle cerebral arteries are widely patent.  Negative for aneurysm or intracranial stenosis.  IMPRESSION: No acute intracranial abnormality. No significant abnormality aside from sinusitis.  MRA head is negative.   Electronically Signed   By: Marlan Palau M.D.   On: 07/08/2013 14:52   Mr Brain Wo Contrast  07/08/2013   CLINICAL DATA:  Head pressure. Left arm numbness and weakness. Breast cancer  EXAM: MRI HEAD WITHOUT CONTRAST  MRA HEAD WITHOUT CONTRAST  TECHNIQUE: Multiplanar, multiecho pulse sequences of the brain and surrounding structures were obtained without intravenous contrast. Angiographic images of the head were obtained using MRA technique without contrast.  COMPARISON:  CT head today  FINDINGS: MRI HEAD FINDINGS  Ventricle size is normal. Small hyperintensity in the right frontal temporal white matter. Remaining white matter is normal.  Negative for acute infarct.  Negative for hemorrhage or mass.  Brainstem and cerebellum are normal. Vessels at the base of the brain are patent.  Sinusitis with mucosal thickening in the paranasal sinuses.  Negative for edema or midline shift.  MRA HEAD FINDINGS  Both vertebral arteries are patent to the basilar. Right posterior inferior cerebellar artery is patent. Left posterior inferior cerebellar artery is not visualized. Bilateral AICA is patent. Bilateral superior cerebellar and posterior cerebral arteries are patent without stenosis  Internal carotid artery is widely patent. Anterior and middle cerebral arteries are widely patent.  Negative for aneurysm or intracranial stenosis.  IMPRESSION: No acute intracranial abnormality. No significant abnormality aside from sinusitis.  MRA head is negative.    Electronically Signed   By: Marlan Palau M.D.   On: 07/08/2013 14:52    EKG Interpretation   None       MDM  No diagnosis found. History of left arm and right leg numbness  with slurred speech.  Discussed with Dr. Sherrie Mustache.  MRI/MRA pending I personally performed the services described in this documentation, which was scribed in my presence. The recorded information has been reviewed and is accurate.  16:45   Consultation from internal medicine.   Dr. Sherrie Mustache decided that patient could be discharged home.  She recommended decreasing lisinopril from 10 to 5 mg daily.  Danielle Hutching, MD 07/08/13 1326  Danielle Hutching, MD 07/08/13 [phone]

## 2013-07-08 NOTE — ED Notes (Signed)
Pt ambulated across the dept without any difficulty. Has been up to the bathroom numerous times without help

## 2013-07-08 NOTE — ED Notes (Signed)
Pt asking for pain medication. States she takes pan medication every day. EDP in with pt. States he will order her something

## 2013-07-08 NOTE — Consult Note (Signed)
The patient was seen and examined. She was discussed with nurse practitioner, Ms. Vedia Coffer. The patient's vital signs, laboratory studies, in chart were reviewed. Per history given to me, the dose of her blood pressure medication, lisinopril was recently increased from 5 mg to 10 mg. Her symptomatology may have been secondary to hypovolemia with transient hypotension as her blood pressure was on the lower end of normal during her stay in the emergency department. MRA/MRI of the head/brain was ordered to rule out an acute stroke. The results were unremarkable. Upon my examination, the patient had no focal deficits. She had no cranial nerve deficits. She was witnessed to ambulate to the bathroom in the emergency department by the registered nurse. The patient was strongly encouraged to stop smoking. She was instructed to decrease lisinopril from 10 mg to 5 mg. She was encouraged to stay well hydrated. She was discharged from the emergency department with instructions to followup with her primary care physician regarding her symptomatology. She voiced understanding.

## 2013-07-08 NOTE — ED Notes (Signed)
Pt keeps coming out of bed over the side rails and disconnecting herself from the monitor. Pt instructed to use her call bell when she needs anything

## 2013-07-08 NOTE — ED Notes (Signed)
Dr. Sherrie Mustache in to eval. Pt to be discharged home

## 2013-07-08 NOTE — ED Notes (Signed)
Danielle Swanson (Father) [phone] (h)  [phone] (c)

## 2013-07-08 NOTE — ED Notes (Signed)
In to medicate pt. Pt had disconnected herself again from the monitor and climbed out of bed to go to the bathroom

## 2013-07-08 NOTE — ED Notes (Signed)
Pt eating lunch

## 2013-07-08 NOTE — Consult Note (Signed)
[facility] Medical Consultation  LAURYNN MCCORVEY WUJ:[id number] DOB: 04/24/1971 DOA: 07/08/2013 PCP: Dorrene German, MD   Requesting physician: Dr Adriana Simas Date of consultation: 07/08/13 Reason for consultation: ED visit for cc left arm numbness and right leg weakness  Impression/Recommendations Principal Problem:   Left arm numbness: Patient is a smoker with a history of hypertension with questionable medication compliance. Some concern for TIA/stroke. MRI/MRA obtained in the emergency department. Therefore negative for acute abnormality as indicated above. In addition patient demonstrated her ability to climb over the bed rail and ambulate to the bathroom on 2 separate occasions. Neurological exam benign. Recommend she should be discharged to home from the emergency department and that she followup with her primary care provider regarding her left arm numbness. She does report a history of cervical disc degeneration.  Right leg weakness: See #1. Recent ambulated in hall during her stay in the emergency department with a steady gait without assistance. Neurological exam within the limits of normal.  Active Problems:  Hypertension: Patient's home medications include Cipro. She reports the dose was increased yesterday from 5 mg to 10 mg. Given her systolic blood pressure range during her stay in the emergency department recommend that she go back to 5 mg daily. Recommend she followup with her primary care provider in one week to evaluate blood pressure control.    Fibromyalgia: She reports she goes to the pain clinic in [place]. She also reports she has an appointment 07/09/2013. Patient's home medications include oxycodone 20 mg tabs 4 times daily.    Coronary artery disease: She reported no chest pain during her stay in the emergency department. EKG with normal sinus rhythm.    GERD (gastroesophageal reflux disease): Patient ate a full lunch during her stay in the emergency department  without any issues. She is on Prilosec at home. Recommend she continue this.       Chief Complaint: left arm numbness and right leg weakness.   HPI:  This is a 42 year old female with a past medical history that includes hypertension, fibromyalgia, bipolar 1 disorder, presents to the emergency room with the chief complaint of left arm numbness and right leg weakness. Information is obtained from the patient. She states that when she awakened this morning she developed numbness of the left arm. She denies any weakness of her arm. She reports that 7 days ago she had a "dizzy spell" and fell into the bookshelf that her home. She denies loss of consciousness or injury. She states she came to the emergency room because "I've had a stroke". She reports facial drooping and slurred speech. She states that she feels like her tongue is "thick". She also reports some weakness in her right leg. She states she also has a history of lung cancer that was diagnosed 2 years ago but she is not currently under any treatment as her oncologist "retired". She denies any fever chills or recent sick contacts. She denies any dysuria hematuria frequency or urgency. She denies abdominal pain nausea vomiting constipation diarrhea. She denies any chest pain palpitation shortness of breath headache or visual disturbances. Evaluation in the emergency room yields a basic metabolic panel is unremarkable. Complete blood count is unremarkable. Urinalysis is unremarkable.  EKG with normal sinus rhythm CT of the brain is unremarkable without acute abnormality. MRI/MRA of the brain was ordered in the emergency department. Results healed no acute intracranial abnormality. No significant abnormality aside  from sinusitis. MRA head is negative. Vital signs were stable in the emergency  room. Systolic blood pressure range was 106-120. She reports that just yesterday her lisinopril was increased from 5 mg to 10 mg. [facility] asked to consult for possible  admission   Review of Systems:  And point review of systems completed all systems are negative except as indicated in the history of present illness  Past Medical History  Diagnosis Date  . Bipolar 1 disorder   . PTSD (post-traumatic stress disorder)   . Fibromyalgia   . Mass of left breast     breast cancer?   Marland Kitchen Arthritis   . Coronary artery disease   . Myocardial infarct   . Tumors   . Stomach ulcer   . Lung cancer   . Ovarian cancer     per patient  . GERD (gastroesophageal reflux disease)    Past Surgical History  Procedure Laterality Date  . Abdominal hysterectomy  2011  . Breast lumpectomy  2011  . Colonoscopy with esophagogastroduodenoscopy (egd) N/A 05/22/2013    [id number] EGD/Normal ileocolonoscopy-status post segmental biopsy and stool sampling. Patient's symptoms are out of proportion to endoscopic findings  . Hand surgery     Social History:  reports that she has been smoking Cigarettes.  She has been smoking about 0.50 packs per day. She does not have any smokeless tobacco history on file. She reports that she does not drink alcohol or use illicit drugs.she is currently unemployed as she is on permanent disability and she lives with her father. She recently moved here from [place]  Allergies  Allergen Reactions  . Aspirin Other (See Comments)    Thins blood, swells throat  . Ranitidine Swelling    Tongue  . Haldol [Haloperidol Decanoate] Other (See Comments)    Causes Stiffness and drawing up  . Imitrex [Sumatriptan] Other (See Comments)    Heart Palpations   . Nsaids Other (See Comments)    Ulcers prohibit patient from being able to take the following medication  . Suboxone [Buprenorphine Hcl-Naloxone Hcl] Other (See Comments)    Stomach swelling  . Sulfa Antibiotics Other (See Comments)    Blisters skin  . Toradol [Ketorolac Tromethamine] Swelling  . Tylenol [Acetaminophen] Other (See Comments)    Too much liver damage  . Ultram [Tramadol Hcl]  Nausea Only    "biting" sensation within stomach   Family History  Problem Relation Age of Onset  . Cancer Other   . Diabetes Other   . Colon cancer      uncles per patient  . Stomach cancer      uncles per patient    Prior to Admission medications   Medication Sig Start Date End Date Taking? Authorizing Provider  acetaminophen (TYLENOL) 500 MG tablet Take 500 mg by mouth every 6 (six) hours as needed for moderate pain.   Yes Historical Provider, MD  albuterol-ipratropium (COMBIVENT) 18-103 MCG/ACT inhaler Inhale 2 puffs into the lungs every 6 (six) hours as needed for wheezing or shortness of breath.    Yes Historical Provider, MD  ALPRAZolam Prudy Feeler) 1 MG tablet Take 1 tablet (1 mg total) by mouth 3 (three) times daily as needed for anxiety. 02/19/13  Yes Jamesetta Orleans Lawyer, PA-C  desloratadine (CLARINEX) 5 MG tablet Take 1 tablet (5 mg total) by mouth daily. 04/15/12  Yes Burgess Amor, PA-C  estradiol (ESTRACE) 1 MG tablet Take 1 tablet (1 mg total) by mouth daily. 02/03/13  Yes Tammy L. Triplett, PA-C  gabapentin (NEURONTIN) 600 MG tablet Take 600 mg by mouth 2 (two) times  daily.    Yes Historical Provider, MD  lisinopril (PRINIVIL,ZESTRIL) 10 MG tablet Take 1 tablet (10 mg total) by mouth daily. 02/03/13  Yes Tammy L. Triplett, PA-C  Multiple Vitamin (MULTIVITAMIN WITH MINERALS) TABS tablet Take 1 tablet by mouth daily.   Yes Historical Provider, MD  omeprazole (PRILOSEC) 20 MG capsule Take 20 mg by mouth 2 (two) times daily.   Yes Historical Provider, MD  Oxycodone HCl 20 MG TABS Take 1 tablet by mouth 4 (four) times daily.   Yes Historical Provider, MD  PRESCRIPTION MEDICATION Place 2 drops into both eyes daily as needed (for conjunctivitis).   Yes Historical Provider, MD  promethazine (PHENERGAN) 25 MG tablet Take 25 mg by mouth every 6 (six) hours as needed for nausea or vomiting.   Yes Historical Provider, MD   Physical Exam: Blood pressure 106/62, pulse 77, temperature 98.1 F  (36.7 C), resp. rate 14, height 5' 3.5" (1.613 m), weight 61.236 kg (135 lb), SpO2 100.00%. Filed Vitals:   07/08/13 1540  BP:   Pulse: 77  Temp:   Resp: 14     General:  Petite calm no acute  Eyes: Pupils are equal round reactive to light. EOMI. There is no scleral icterus  ENT: Ears are clear nose without drainage oropharynx without erythema or exudate. Mucous membranes of her mouth are pink slightly dry  Neck: Supple no JVD full range of motion no lymphadenopathy  Cardiovascular: Regular rate and rhythm with occasional ectopic beat. I hear no murmur no gallop no rub. There is no lower extremity edema. Pedal pulses are present and palpable  Respiratory: She is a normal respiratory effort. Breath sounds are slightly distant and somewhat coarse. Mild end expiratory wheeze particularly on the right base. I hear no crackles  Abdomen: Flat soft positive bowel sounds. Her abdomen is nontender to palpation. Appreciate no mass organomegaly  Skin: Skin is warm and dry without any rashes or lesions  Musculoskeletal: Moves all extremities. Good muscle tone. Joints without swelling/erythema.  Psychiatric: Slightly anxious but cooperative.  Neurologic: Speech is slow and deliberate but clear. She has facial symmetry. Cranial nerves II through XII are grossly intact. Appreciate no pronator drift. Of note nursing staff indicate on 2 separate occasions patient called over the bedrail and ambulated to the bathroom independently. Nursing staff reports steady gait  Labs on Admission:  Basic Metabolic Panel:  Recent Labs Lab 07/08/13 0915  NA 142  K 3.9  CL 106  CO2 27  GLUCOSE 84  BUN 10  CREATININE 0.74  CALCIUM 9.3   Liver Function Tests: No results found for this basename: AST, ALT, ALKPHOS, BILITOT, PROT, ALBUMIN,  in the last 168 hours No results found for this basename: LIPASE, AMYLASE,  in the last 168 hours No results found for this basename: AMMONIA,  in the last 168  hours CBC:  Recent Labs Lab 07/08/13 0915  WBC 7.0  NEUTROABS 2.3  HGB 12.8  HCT 38.9  MCV 93.7  PLT 357   Cardiac Enzymes: No results found for this basename: CKTOTAL, CKMB, CKMBINDEX, TROPONINI,  in the last 168 hours BNP: No components found with this basename: POCBNP,  CBG: No results found for this basename: GLUCAP,  in the last 168 hours  Radiological Exams on Admission: Ct Head Wo Contrast  07/08/2013   CLINICAL DATA:  42 year old female with left upper extremity numbness. Head pressure. Initial encounter.  EXAM: CT HEAD WITHOUT CONTRAST  TECHNIQUE: Contiguous axial images were obtained from the base  of the skull through the vertex without intravenous contrast.  COMPARISON:  10/19/2011 and earlier.  FINDINGS: Mild mucosal thickening in the sphenoid sinuses has increased. Other Visualized paranasal sinuses and mastoids are clear. No acute osseous abnormality identified. Visualized orbits and scalp soft tissues are within normal limits. Mild *INSERT* calyx  Stable cerebral volume which appears normal for age. No midline shift, ventriculomegaly, mass effect, evidence of mass lesion, intracranial hemorrhage or evidence of cortically based acute infarction. Gray-white matter differentiation is within normal limits throughout the brain. No suspicious intracranial vascular hyperdensity.  IMPRESSION: Stable and normal noncontrast CT appearance of the brain.   Electronically Signed   By: Augusto Gamble M.D.   On: 07/08/2013 09:19   Mr Maxine Glenn Head Wo Contrast  07/08/2013   CLINICAL DATA:  Head pressure. Left arm numbness and weakness. Breast cancer  EXAM: MRI HEAD WITHOUT CONTRAST  MRA HEAD WITHOUT CONTRAST  TECHNIQUE: Multiplanar, multiecho pulse sequences of the brain and surrounding structures were obtained without intravenous contrast. Angiographic images of the head were obtained using MRA technique without contrast.  COMPARISON:  CT head today  FINDINGS: MRI HEAD FINDINGS  Ventricle size is  normal. Small hyperintensity in the right frontal temporal white matter. Remaining white matter is normal.  Negative for acute infarct.  Negative for hemorrhage or mass.  Brainstem and cerebellum are normal. Vessels at the base of the brain are patent.  Sinusitis with mucosal thickening in the paranasal sinuses.  Negative for edema or midline shift.  MRA HEAD FINDINGS  Both vertebral arteries are patent to the basilar. Right posterior inferior cerebellar artery is patent. Left posterior inferior cerebellar artery is not visualized. Bilateral AICA is patent. Bilateral superior cerebellar and posterior cerebral arteries are patent without stenosis  Internal carotid artery is widely patent. Anterior and middle cerebral arteries are widely patent.  Negative for aneurysm or intracranial stenosis.  IMPRESSION: No acute intracranial abnormality. No significant abnormality aside from sinusitis.  MRA head is negative.   Electronically Signed   By: Marlan Palau M.D.   On: 07/08/2013 14:52   Mr Brain Wo Contrast  07/08/2013   CLINICAL DATA:  Head pressure. Left arm numbness and weakness. Breast cancer  EXAM: MRI HEAD WITHOUT CONTRAST  MRA HEAD WITHOUT CONTRAST  TECHNIQUE: Multiplanar, multiecho pulse sequences of the brain and surrounding structures were obtained without intravenous contrast. Angiographic images of the head were obtained using MRA technique without contrast.  COMPARISON:  CT head today  FINDINGS: MRI HEAD FINDINGS  Ventricle size is normal. Small hyperintensity in the right frontal temporal white matter. Remaining white matter is normal.  Negative for acute infarct.  Negative for hemorrhage or mass.  Brainstem and cerebellum are normal. Vessels at the base of the brain are patent.  Sinusitis with mucosal thickening in the paranasal sinuses.  Negative for edema or midline shift.  MRA HEAD FINDINGS  Both vertebral arteries are patent to the basilar. Right posterior inferior cerebellar artery is patent.  Left posterior inferior cerebellar artery is not visualized. Bilateral AICA is patent. Bilateral superior cerebellar and posterior cerebral arteries are patent without stenosis  Internal carotid artery is widely patent. Anterior and middle cerebral arteries are widely patent.  Negative for aneurysm or intracranial stenosis.  IMPRESSION: No acute intracranial abnormality. No significant abnormality aside from sinusitis.  MRA head is negative.   Electronically Signed   By: Marlan Palau M.D.   On: 07/08/2013 14:52    EKG: Normal sinus rhythm with no acute changes  Time spent: 60 minutes  [facility]   If 7PM-7AM, please contact night-coverage [email or web address] Password [facility] 07/08/2013, 4:42 PM

## 2013-07-08 NOTE — Telephone Encounter (Signed)
Mailed letter °

## 2013-07-08 NOTE — ED Notes (Signed)
Pt states she woke up at 0300 this am with slurred speech, left arm numbness and right leg tingling.

## 2013-07-21 ENCOUNTER — Ambulatory Visit: Payer: Medicaid Other | Admitting: Internal Medicine

## 2013-07-28 ENCOUNTER — Ambulatory Visit: Payer: Medicaid Other

## 2013-08-06 ENCOUNTER — Emergency Department (HOSPITAL_COMMUNITY)
Admission: EM | Admit: 2013-08-06 | Discharge: 2013-08-06 | Disposition: A | Discharge status: Home / Self Care | Payer: Medicaid Other | Attending: Emergency Medicine | Admitting: Emergency Medicine

## 2013-08-06 ENCOUNTER — Emergency Department (HOSPITAL_COMMUNITY): Payer: Medicaid Other

## 2013-08-06 ENCOUNTER — Encounter (HOSPITAL_COMMUNITY): Payer: Self-pay | Admitting: Emergency Medicine

## 2013-08-06 DIAGNOSIS — R339 Retention of urine, unspecified: Secondary | ICD-10-CM | POA: Insufficient documentation

## 2013-08-06 DIAGNOSIS — K5900 Constipation, unspecified: Secondary | ICD-10-CM | POA: Insufficient documentation

## 2013-08-06 DIAGNOSIS — Z8739 Personal history of other diseases of the musculoskeletal system and connective tissue: Secondary | ICD-10-CM | POA: Insufficient documentation

## 2013-08-06 DIAGNOSIS — I252 Old myocardial infarction: Secondary | ICD-10-CM | POA: Insufficient documentation

## 2013-08-06 DIAGNOSIS — IMO0001 Reserved for inherently not codable concepts without codable children: Secondary | ICD-10-CM | POA: Insufficient documentation

## 2013-08-06 DIAGNOSIS — Z8505 Personal history of malignant neoplasm of liver: Secondary | ICD-10-CM | POA: Insufficient documentation

## 2013-08-06 DIAGNOSIS — M545 Low back pain, unspecified: Secondary | ICD-10-CM | POA: Insufficient documentation

## 2013-08-06 DIAGNOSIS — R509 Fever, unspecified: Secondary | ICD-10-CM | POA: Insufficient documentation

## 2013-08-06 DIAGNOSIS — Z85118 Personal history of other malignant neoplasm of bronchus and lung: Secondary | ICD-10-CM | POA: Insufficient documentation

## 2013-08-06 DIAGNOSIS — K219 Gastro-esophageal reflux disease without esophagitis: Secondary | ICD-10-CM | POA: Insufficient documentation

## 2013-08-06 DIAGNOSIS — Z8711 Personal history of peptic ulcer disease: Secondary | ICD-10-CM | POA: Insufficient documentation

## 2013-08-06 DIAGNOSIS — F17200 Nicotine dependence, unspecified, uncomplicated: Secondary | ICD-10-CM | POA: Insufficient documentation

## 2013-08-06 DIAGNOSIS — Z8542 Personal history of malignant neoplasm of other parts of uterus: Secondary | ICD-10-CM | POA: Insufficient documentation

## 2013-08-06 DIAGNOSIS — Z853 Personal history of malignant neoplasm of breast: Secondary | ICD-10-CM | POA: Insufficient documentation

## 2013-08-06 DIAGNOSIS — Z79899 Other long term (current) drug therapy: Secondary | ICD-10-CM | POA: Insufficient documentation

## 2013-08-06 DIAGNOSIS — F319 Bipolar disorder, unspecified: Secondary | ICD-10-CM | POA: Insufficient documentation

## 2013-08-06 DIAGNOSIS — Z90710 Acquired absence of both cervix and uterus: Secondary | ICD-10-CM | POA: Insufficient documentation

## 2013-08-06 DIAGNOSIS — Z3202 Encounter for pregnancy test, result negative: Secondary | ICD-10-CM | POA: Insufficient documentation

## 2013-08-06 DIAGNOSIS — Z8543 Personal history of malignant neoplasm of ovary: Secondary | ICD-10-CM | POA: Insufficient documentation

## 2013-08-06 DIAGNOSIS — I2510 Atherosclerotic heart disease of native coronary artery without angina pectoris: Secondary | ICD-10-CM | POA: Insufficient documentation

## 2013-08-06 HISTORY — DX: Malignant neoplasm of liver, not specified as primary or secondary: C22.9

## 2013-08-06 LAB — CBC WITH DIFFERENTIAL/PLATELET
Basophils Absolute: 0.1 10*3/uL (ref 0.0–0.1)
Basophils Relative: 1 % (ref 0–1)
EOS ABS: 0.4 10*3/uL (ref 0.0–0.7)
EOS PCT: 5 % (ref 0–5)
HCT: 41 % (ref 36.0–46.0)
Hemoglobin: 14.2 g/dL (ref 12.0–15.0)
Lymphocytes Relative: 42 % (ref 12–46)
Lymphs Abs: 3.3 10*3/uL (ref 0.7–4.0)
MCH: 32.5 pg (ref 26.0–34.0)
MCHC: 34.6 g/dL (ref 30.0–36.0)
MCV: 93.8 fL (ref 78.0–100.0)
Monocytes Absolute: 0.7 10*3/uL (ref 0.1–1.0)
Monocytes Relative: 9 % (ref 3–12)
Neutro Abs: 3.4 10*3/uL (ref 1.7–7.7)
Neutrophils Relative %: 44 % (ref 43–77)
Platelets: 361 10*3/uL (ref 150–400)
RBC: 4.37 MIL/uL (ref 3.87–5.11)
RDW: 13.6 % (ref 11.5–15.5)
WBC: 7.8 10*3/uL (ref 4.0–10.5)

## 2013-08-06 LAB — COMPREHENSIVE METABOLIC PANEL
ALT: 12 U/L (ref 0–35)
AST: 19 U/L (ref 0–37)
Albumin: 4 g/dL (ref 3.5–5.2)
Alkaline Phosphatase: 92 U/L (ref 39–117)
BUN: 7 mg/dL (ref 6–23)
CALCIUM: 9.5 mg/dL (ref 8.4–10.5)
CO2: 28 mEq/L (ref 19–32)
CREATININE: 0.73 mg/dL (ref 0.50–1.10)
Chloride: 101 mEq/L (ref 96–112)
GFR calc Af Amer: >90 mL/min (ref >90)
GFR calc non Af Amer: >90 mL/min (ref >90)
GLUCOSE: 67 mg/dL — AB (ref 70–99)
Potassium: 3.5 mEq/L — ABNORMAL LOW (ref 3.7–5.3)
SODIUM: 141 meq/L (ref 137–147)
TOTAL PROTEIN: 7.7 g/dL (ref 6.0–8.3)
Total Bilirubin: 0.3 mg/dL (ref 0.3–1.2)

## 2013-08-06 LAB — PREGNANCY, URINE: Preg Test, Ur: NEGATIVE

## 2013-08-06 LAB — URINALYSIS, ROUTINE W REFLEX MICROSCOPIC
BILIRUBIN URINE: NEGATIVE
Glucose, UA: NEGATIVE mg/dL
Ketones, ur: NEGATIVE mg/dL
Leukocytes, UA: NEGATIVE
Nitrite: NEGATIVE
PROTEIN: NEGATIVE mg/dL
SPECIFIC GRAVITY, URINE: 1.01 (ref 1.005–1.030)
UROBILINOGEN UA: 0.2 mg/dL (ref 0.0–1.0)
pH: 6 (ref 5.0–8.0)

## 2013-08-06 LAB — URINE MICROSCOPIC-ADD ON

## 2013-08-06 MED ORDER — HYDROMORPHONE HCL PF 1 MG/ML IJ SOLN
1.0000 mg | Freq: Once | INTRAMUSCULAR | Status: AC
  Administered 2013-08-06: 1 mg via INTRAVENOUS
  Filled 2013-08-06: qty 1

## 2013-08-06 MED ORDER — PROMETHAZINE HCL 25 MG PO TABS: 25.0000 mg | ORAL_TABLET | Freq: Four times a day (QID) | ORAL | Status: DC | PRN

## 2013-08-06 MED ORDER — ONDANSETRON HCL 4 MG/2ML IJ SOLN
4.0000 mg | Freq: Once | INTRAMUSCULAR | Status: AC
  Administered 2013-08-06: 4 mg via INTRAVENOUS
  Filled 2013-08-06: qty 2

## 2013-08-06 MED ORDER — SODIUM CHLORIDE 0.9 % IV BOLUS (SEPSIS)
1000.0000 mL | Freq: Once | INTRAVENOUS | Status: AC
  Administered 2013-08-06: 1000 mL via INTRAVENOUS

## 2013-08-06 MED ORDER — MORPHINE SULFATE 4 MG/ML IJ SOLN
4.0000 mg | Freq: Once | INTRAMUSCULAR | Status: AC
  Administered 2013-08-06: 4 mg via INTRAVENOUS
  Filled 2013-08-06: qty 1

## 2013-08-06 MED ORDER — POLYETHYLENE GLYCOL 3350 17 G PO PACK: 17.0000 g | PACK | Freq: Every day | ORAL | Status: DC

## 2013-08-06 NOTE — ED Notes (Signed)
Pt unable to void for post void residual.  Dr. Wyvonnia Dusky aware, instructed to insert foley cath.  Pt c/o flank pain and lower abd pain x 9 days.  Reports has not voided since 1am today.

## 2013-08-06 NOTE — ED Provider Notes (Signed)
CSN: [id number]     Arrival date & time 08/06/13  1119 History  This chart was scribed for Ezequiel Essex, MD by Ludger Nutting, ED Scribe. This patient was seen in room APA18/APA18 and the patient's care was started 11:51 AM.    Chief Complaint  Patient presents with  . Dysuria    The history is provided by the patient. No language interpreter was used.    HPI Comments: Danielle Swanson is a 43 y.o. female who presents to the Emergency Department complaining of constant, gradually worsened right sided lower back and flank pain that began 12 days ago. She denies any falls or trauma. She reports having difficulty urinating and urinary retention, states she cannot urinate when she tries to void. She also reports having nausea and reports taking phenergan and Zofran with mild relief. She reports having a fever yesterday of 101. She has a history of uterine cancer which metastasized to the ovaries, breast, lungs, and liver. She denies any current chemotherapy treatments. She denies weakness, numbness.   Past Medical History  Diagnosis Date  . Bipolar 1 disorder   . PTSD (post-traumatic stress disorder)   . Fibromyalgia   . Mass of left breast     breast cancer?   Marland Kitchen Arthritis   . Coronary artery disease   . Myocardial infarct   . Tumors   . Stomach ulcer   . Lung cancer   . Ovarian cancer     per patient  . GERD (gastroesophageal reflux disease)   . Liver cancer    Past Surgical History  Procedure Laterality Date  . Abdominal hysterectomy  2011  . Breast lumpectomy  2011  . Colonoscopy with esophagogastroduodenoscopy (egd) N/A 05/22/2013    TDD:[facility] EGD/Normal ileocolonoscopy-status post segmental biopsy and stool sampling. Patient's symptoms are out of proportion to endoscopic findings  . Hand surgery     Family History  Problem Relation Age of Onset  . Cancer Other   . Diabetes Other   . Colon cancer      uncles per patient  . Stomach cancer      uncles per patient    History  Substance Use Topics  . Smoking status: Current Every Day Smoker -- 0.50 packs/day    Types: Cigarettes  . Smokeless tobacco: Not on file     Comment: trying to wean off, quit.   . Alcohol Use: No   OB History   Grav Para Term Preterm Abortions TAB SAB Ect Mult Living   2 1 1  1  1   1      Review of Systems A complete 10 system review of systems was obtained and all systems are negative except as noted in the HPI and PMH.   Allergies  Aspirin; Ranitidine; Haldol; Imitrex; Nsaids; Suboxone; Sulfa antibiotics; Toradol; Tylenol; and Ultram  Home Medications   Current Outpatient Rx  Name  Route  Sig  Dispense  Refill  . albuterol-ipratropium (COMBIVENT) 18-103 MCG/ACT inhaler   Inhalation   Inhale 2 puffs into the lungs every 6 (six) hours as needed for wheezing or shortness of breath.          . ALPRAZolam (XANAX) 1 MG tablet   Oral   Take 1 tablet (1 mg total) by mouth 3 (three) times daily as needed for anxiety.   12 tablet   0   . desloratadine (CLARINEX) 5 MG tablet   Oral   Take 1 tablet (5 mg total) by mouth daily.  30 tablet   0   . estradiol (ESTRACE) 1 MG tablet   Oral   Take 1 tablet (1 mg total) by mouth daily.   12 tablet   0   . gabapentin (NEURONTIN) 300 MG capsule   Oral   Take 300 mg by mouth 2 (two) times daily.         Marland Kitchen lisinopril (PRINIVIL,ZESTRIL) 5 MG tablet   Oral   Take 5 mg by mouth daily.         Marland Kitchen omeprazole (PRILOSEC) 20 MG capsule   Oral   Take 20 mg by mouth 2 (two) times daily.         . ondansetron (ZOFRAN-ODT) 4 MG disintegrating tablet   Oral   Take 4 mg by mouth every 8 (eight) hours as needed for nausea or vomiting.         . promethazine (PHENERGAN) 25 MG tablet   Oral   Take 25 mg by mouth every 6 (six) hours as needed for nausea or vomiting.         . buprenorphine-naloxone (SUBOXONE) 8-2 MG SUBL SL tablet   Sublingual   Place 1 tablet under the tongue 2 (two) times daily.         .  polyethylene glycol (MIRALAX) packet   Oral   Take 17 g by mouth daily.   14 each   0    BP 122/47  Pulse 81  Temp(Src) 97.2 F (36.2 C) (Oral)  Resp 18  SpO2 98% Physical Exam  Nursing note and vitals reviewed. Constitutional: She is oriented to person, place, and time. She appears well-developed and well-nourished.  HENT:  Head: Normocephalic and atraumatic.  Cardiovascular: Normal rate, regular rhythm and normal heart sounds.   Pulmonary/Chest: Effort normal and breath sounds normal. No respiratory distress. She has no wheezes. She has no rales.  Abdominal: Soft. She exhibits distension. There is tenderness (suprapubic).  Musculoskeletal:  Right paraspinal lumbar tenderness.   Neurological: She is alert and oriented to person, place, and time.  5/5 strength in bilateral lower extremities. Ankle plantar and dorsiflexion intact. Great toe extension intact bilaterally. +2 DP and PT pulses. +2 patellar reflexes bilaterally. Normal gait.   Skin: Skin is warm and dry.  Psychiatric: She has a normal mood and affect.    ED Course  Procedures (including critical care time)  DIAGNOSTIC STUDIES: Oxygen Saturation is 97% on RA, adequate by my interpretation.    COORDINATION OF CARE: 12:12 PM Discussed treatment plan with pt at bedside and pt agreed to plan.   Labs Review Labs Reviewed  URINALYSIS, ROUTINE W REFLEX MICROSCOPIC - Abnormal; Notable for the following:    Hgb urine dipstick TRACE (*)    All other components within normal limits  COMPREHENSIVE METABOLIC PANEL - Abnormal; Notable for the following:    Potassium 3.5 (*)    Glucose, Bld 67 (*)    All other components within normal limits  URINE MICROSCOPIC-ADD ON - Abnormal; Notable for the following:    Squamous Epithelial / LPF FEW (*)    All other components within normal limits  CBC WITH DIFFERENTIAL  PREGNANCY, URINE   Imaging Review Ct Abdomen Pelvis Wo Contrast  08/06/2013   CLINICAL DATA:  Back pain  history of left-sided breast mass, lung cancer, ovarian cancer, liver cancer  EXAM: CT ABDOMEN AND PELVIS WITHOUT CONTRAST  TECHNIQUE: Multidetector CT imaging of the abdomen and pelvis was performed following the standard protocol without intravenous contrast.  COMPARISON:  PET-CT -06/26/2011; chest CT -06/08/2011  FINDINGS: The lack of intravenous contrast limits the ability to evaluate solid abdominal organs.  Normal hepatic contour. Normal noncontrast appearance of the gallbladder given degree of distention. No definite radiopaque gallstones. No definite pericholecystic fluid. No ascites.  Normal noncontrast appearance of the bilateral adrenal glands, pancreas and spleen.  Large colonic stool burden without evidence of obstruction. There is a minimal amount of high-density material (coronal images 35 and 40, series 4) within otherwise normal-appearing appendix. No associated periappendiceal stranding. No pneumoperitoneum, pneumatosis or portal venous gas.  Normal caliber of the abdominal aorta. Scattered shotty retroperitoneal lymph nodes are individually not enlarged by size criteria. No bulky retroperitoneal, mesenteric, pelvic or inguinal lymphadenopathy on this noncontrast examination.  Post hysterectomy. No discrete adnexal lesion. No free fluid within the pelvis. The urinary bladder is decompressed with a Foley catheter. There is a small amount of iatrogenic air within the urinary bladder.  Limited visualization the lower thorax demonstrates an approximately 0.5 cm nodule within the imaged caudal aspect of the lingula (image 4, series 3), unchanged to minimally decreased since the 05/2011 examination and thus of benign etiology. Minimal bibasilar subsegmental atelectasis. No focal airspace opacities. No pleural effusion. Normal heart size. No pericardial effusion.  No acute or aggressive osseous abnormalities. Mild DDD of T11-T12 disc space height loss, endplate irregularity anteriorly directed disc  osteophytosis. Regional soft tissues appear normal.  IMPRESSION: 1. No explanation for patient's right-sided flank pain, specifically, no evidence of nephrolithiasis, urinary or enteric obstruction 2. Possible appendicoliths within an otherwise normal-appearing appendix. 3. Large colonic stool burden through   Electronically Signed   By: Sandi Mariscal M.D.   On: 08/06/2013 14:25   Mr Lumbar Spine Wo Contrast  08/06/2013   CLINICAL DATA:  Urinary incontinence. History of lung and breast cancer.  EXAM: MRI LUMBAR SPINE WITHOUT CONTRAST  TECHNIQUE: Multiplanar, multisequence MR imaging was performed. No intravenous contrast was administered.  COMPARISON:  None.  FINDINGS: The skin covers the region from lower T11 through S3.  There is no evidence of metastatic disease or neural compression.  There are minimal, non-compressive disc bulges at L3-4 and L5-S1. There is mild facet degeneration at L4-5 and L5-S1 without slippage. The distal cord and conus appear normal as do the nerve roots of the region.  IMPRESSION: No cause of urinary incontinence identified. No evidence of metastatic disease in the region image. No neural compressive pathology.   Electronically Signed   By: Nelson Chimes M.D.   On: 08/06/2013 13:18    EKG Interpretation   None       MDM   1. Urinary retention   2. Constipation    Right-sided low back pain for the past 12 days. Urinary retention since last night. No nausea, vomiting or fever. History of breast cancer that is metastatic to lungs and liver. Currently not getting treatment.  Patient unable to void. Foley catheter placed with 600 cc of output. In setting of cancer and urinary retention, will obtain MRI to evaluate for cauda equina syndrome.   MRI is negative for cord compression or other acute pathology. Creatinine is stable. Urinalysis is negative.   CT scan does not show any etiology of patient's urinary retention.   patient will be discharged with Foley catheter in  place and followup with urology. Imaging did not reveal source of patient's urinary retention. No evidence of cord compression or cauda equina. Consider medication related. She has an appointment with the pain clinic  tomorrow. Will treat constipation with miralax.  Advised to discontinue phenergan as this may be contributing.  D/w Truddie Crumble at [facility] to cancel phenergan prescription.  I personally performed the services described in this documentation, which was scribed in my presence. The recorded information has been reviewed and is accurate.   Ezequiel Essex, MD 08/06/13 [phone]

## 2013-08-06 NOTE — ED Notes (Signed)
Pt still in MRI 

## 2013-08-06 NOTE — ED Notes (Signed)
Pt complain of back pain 7/10. States morphine does not help her pain but dilaudid does. EDP aware

## 2013-08-06 NOTE — ED Notes (Signed)
Pt c/o right side lower back/flank pain since yesterday. Burning with urination. States has not urinated since 8pm last night. Pt feels like her bladder is going to explode but nothing comes out. Pt has current lung and liver cancer.

## 2013-08-06 NOTE — Discharge Instructions (Signed)
Acute Urinary Retention Acute urinary retention is the temporary inability to urinate. This is an uncommon problem in women. It can be caused by:  Infection.  A side effect of a medicine.  A problem in a nearby organ that presses or squeezes on the bladder or the urethra (the tube that drains the bladder).  Psychological problems.   Surgery on your bladder, urethra, or pelvic organs that causes obstruction to the outflow of urine from your bladder. HOME CARE INSTRUCTIONS  If you are sent home with a Foley catheter and a drainage system, you will need to discuss the best course of action with your health care provider. While the catheter is in, maintain a good intake of fluids. Keep the drainage bag emptied and lower than your catheter. This is so that contaminated urine will not flow back into your bladder, which could lead to a urinary tract infection. There are two main types of drainage bags. One is a large bag that usually is used at night. It has a good capacity that will allow you to sleep through the night without having to empty it. The second type is called a leg bag. It has a smaller capacity so it needs to be emptied more frequently. However, the main advantage is that it can be attached by a leg strap and goes underneath your clothing, allowing you the freedom to move about or leave your home. Only take over-the-counter or prescription medicines for pain, discomfort, or fever as directed by your health care provider.  SEEK MEDICAL CARE IF:  You develop a low-grade fever.  You experience spasms or leakage of urine with the spasms. SEEK IMMEDIATE MEDICAL CARE IF:   You develop chills or fever.  Your catheter stops draining urine.  Your catheter falls out.  You start to develop increased bleeding that does not respond to rest and increased fluid intake. MAKE SURE YOU:  Understand these instructions.  Will watch your condition.  Will get help right away if you are not  doing well or get worse. Document Released: 07/09/2006 Document Revised: 04/30/2013 Document Reviewed: 12/19/2012 [facility] Patient Information 2014 [place].

## 2013-08-06 NOTE — ED Notes (Signed)
639ml urine in foley bag.  Pt reports feeling relief in abd.

## 2013-09-04 ENCOUNTER — Encounter (HOSPITAL_COMMUNITY): Payer: Self-pay | Admitting: Emergency Medicine

## 2013-09-04 ENCOUNTER — Emergency Department (HOSPITAL_COMMUNITY): Payer: Medicaid Other

## 2013-09-04 ENCOUNTER — Emergency Department (HOSPITAL_COMMUNITY)
Admission: EM | Admit: 2013-09-04 | Discharge: 2013-09-04 | Disposition: A | Discharge status: Home / Self Care | Payer: Medicaid Other | Attending: Emergency Medicine | Admitting: Emergency Medicine

## 2013-09-04 DIAGNOSIS — IMO0002 Reserved for concepts with insufficient information to code with codable children: Secondary | ICD-10-CM | POA: Insufficient documentation

## 2013-09-04 DIAGNOSIS — F4310 Post-traumatic stress disorder, unspecified: Secondary | ICD-10-CM | POA: Insufficient documentation

## 2013-09-04 DIAGNOSIS — W19XXXA Unspecified fall, initial encounter: Secondary | ICD-10-CM

## 2013-09-04 DIAGNOSIS — S199XXA Unspecified injury of neck, initial encounter: Principal | ICD-10-CM

## 2013-09-04 DIAGNOSIS — M542 Cervicalgia: Secondary | ICD-10-CM

## 2013-09-04 DIAGNOSIS — Z8739 Personal history of other diseases of the musculoskeletal system and connective tissue: Secondary | ICD-10-CM | POA: Insufficient documentation

## 2013-09-04 DIAGNOSIS — Y92009 Unspecified place in unspecified non-institutional (private) residence as the place of occurrence of the external cause: Secondary | ICD-10-CM | POA: Insufficient documentation

## 2013-09-04 DIAGNOSIS — R209 Unspecified disturbances of skin sensation: Secondary | ICD-10-CM | POA: Insufficient documentation

## 2013-09-04 DIAGNOSIS — K219 Gastro-esophageal reflux disease without esophagitis: Secondary | ICD-10-CM | POA: Insufficient documentation

## 2013-09-04 DIAGNOSIS — I2510 Atherosclerotic heart disease of native coronary artery without angina pectoris: Secondary | ICD-10-CM | POA: Insufficient documentation

## 2013-09-04 DIAGNOSIS — S0990XA Unspecified injury of head, initial encounter: Secondary | ICD-10-CM | POA: Insufficient documentation

## 2013-09-04 DIAGNOSIS — Y939 Activity, unspecified: Secondary | ICD-10-CM | POA: Insufficient documentation

## 2013-09-04 DIAGNOSIS — Z85118 Personal history of other malignant neoplasm of bronchus and lung: Secondary | ICD-10-CM | POA: Insufficient documentation

## 2013-09-04 DIAGNOSIS — S3981XA Other specified injuries of abdomen, initial encounter: Secondary | ICD-10-CM | POA: Insufficient documentation

## 2013-09-04 DIAGNOSIS — Z8543 Personal history of malignant neoplasm of ovary: Secondary | ICD-10-CM | POA: Insufficient documentation

## 2013-09-04 DIAGNOSIS — S0993XA Unspecified injury of face, initial encounter: Secondary | ICD-10-CM | POA: Insufficient documentation

## 2013-09-04 DIAGNOSIS — F319 Bipolar disorder, unspecified: Secondary | ICD-10-CM | POA: Insufficient documentation

## 2013-09-04 DIAGNOSIS — W010XXA Fall on same level from slipping, tripping and stumbling without subsequent striking against object, initial encounter: Secondary | ICD-10-CM | POA: Insufficient documentation

## 2013-09-04 DIAGNOSIS — Z79899 Other long term (current) drug therapy: Secondary | ICD-10-CM | POA: Insufficient documentation

## 2013-09-04 DIAGNOSIS — F17200 Nicotine dependence, unspecified, uncomplicated: Secondary | ICD-10-CM | POA: Insufficient documentation

## 2013-09-04 DIAGNOSIS — Z8505 Personal history of malignant neoplasm of liver: Secondary | ICD-10-CM | POA: Insufficient documentation

## 2013-09-04 DIAGNOSIS — M549 Dorsalgia, unspecified: Secondary | ICD-10-CM

## 2013-09-04 DIAGNOSIS — Z90710 Acquired absence of both cervix and uterus: Secondary | ICD-10-CM | POA: Insufficient documentation

## 2013-09-04 DIAGNOSIS — I252 Old myocardial infarction: Secondary | ICD-10-CM | POA: Insufficient documentation

## 2013-09-04 LAB — URINALYSIS, ROUTINE W REFLEX MICROSCOPIC
Bilirubin Urine: NEGATIVE
GLUCOSE, UA: NEGATIVE mg/dL
HGB URINE DIPSTICK: NEGATIVE
Ketones, ur: NEGATIVE mg/dL
Leukocytes, UA: NEGATIVE
NITRITE: NEGATIVE
Protein, ur: NEGATIVE mg/dL
Urobilinogen, UA: 0.2 mg/dL (ref 0.0–1.0)
pH: 6 (ref 5.0–8.0)

## 2013-09-04 MED ORDER — METHOCARBAMOL 500 MG PO TABS
1000.0000 mg | ORAL_TABLET | Freq: Once | ORAL | Status: AC
  Administered 2013-09-04: 1000 mg via ORAL
  Filled 2013-09-04: qty 2

## 2013-09-04 MED ORDER — PREDNISONE 50 MG PO TABS
60.0000 mg | ORAL_TABLET | Freq: Once | ORAL | Status: AC
  Administered 2013-09-04: 14:00:00 60 mg via ORAL
  Filled 2013-09-04 (×2): qty 1

## 2013-09-04 NOTE — ED Notes (Signed)
PT REQUESTING PAIN MEDS

## 2013-09-04 NOTE — ED Notes (Signed)
Pt reports tripped over a rug at home and fell.  C/O head, neck, back pain, and says legs and r arm feel numb.

## 2013-09-04 NOTE — ED Provider Notes (Signed)
CSN: [id number]     Arrival date & time 09/04/13  4917 History   First MD Initiated Contact with Patient 09/04/13 0930     Chief Complaint  Patient presents with  . Fall     (Consider location/radiation/quality/duration/timing/severity/associated sxs/prior Treatment) HPI Comments: Pt states she has a hx of disc disease and fibromyalgia. She also reports arthritis and history of multiple sites of cancer. She reports since her fall, she has some numbness of the legs. She has some numbness and tingling from time to time, but this is worse since the fall. No loss of bowel or bladder function.  Patient is a 43 y.o. female presenting with fall. The history is provided by the patient.  Fall This is a new problem. The current episode started today. The problem occurs constantly. The problem has been gradually worsening. Associated symptoms include abdominal pain, arthralgias, headaches, myalgias and neck pain. Pertinent negatives include no chest pain, coughing, vertigo or visual change. The symptoms are aggravated by walking and standing. She has tried nothing for the symptoms. The treatment provided no relief.    Past Medical History  Diagnosis Date  . Bipolar 1 disorder   . PTSD (post-traumatic stress disorder)   . Fibromyalgia   . Mass of left breast     breast cancer?   Marland Kitchen Arthritis   . Coronary artery disease   . Myocardial infarct   . Tumors   . Stomach ulcer   . Lung cancer   . Ovarian cancer     per patient  . GERD (gastroesophageal reflux disease)   . Liver cancer    Past Surgical History  Procedure Laterality Date  . Abdominal hysterectomy  2011  . Breast lumpectomy  2011  . Colonoscopy with esophagogastroduodenoscopy (egd) N/A 05/22/2013    H[id number] EGD/Normal ileocolonoscopy-status post segmental biopsy and stool sampling. Patient's symptoms are out of proportion to endoscopic findings  . Hand surgery     Family History  Problem Relation Age of Onset  . Cancer Other    . Diabetes Other   . Colon cancer      uncles per patient  . Stomach cancer      uncles per patient   History  Substance Use Topics  . Smoking status: Current Every Day Smoker -- 0.50 packs/day    Types: Cigarettes  . Smokeless tobacco: Not on file     Comment: trying to wean off, quit.   . Alcohol Use: No   OB History   Grav Para Term Preterm Abortions TAB SAB Ect Mult Living   2 1 1  1  1   1      Review of Systems  Constitutional: Negative for activity change.       All ROS Neg except as noted in HPI  HENT: Negative for nosebleeds.   Eyes: Negative for photophobia and discharge.  Respiratory: Negative for cough, shortness of breath and wheezing.   Cardiovascular: Negative for chest pain and palpitations.  Gastrointestinal: Positive for abdominal pain. Negative for blood in stool.  Genitourinary: Negative for dysuria, frequency and hematuria.  Musculoskeletal: Positive for arthralgias, myalgias and neck pain. Negative for back pain.  Skin: Negative.   Neurological: Positive for headaches. Negative for dizziness, vertigo, seizures and speech difficulty.  Psychiatric/Behavioral: Negative for hallucinations and confusion.      Allergies  Aspirin; Ranitidine; Haldol; Imitrex; Nsaids; Suboxone; Sulfa antibiotics; Toradol; Tylenol; and Ultram  Home Medications   Current Outpatient Rx  Name  Route  Sig  Dispense  Refill  . albuterol-ipratropium (COMBIVENT) 18-103 MCG/ACT inhaler   Inhalation   Inhale 2 puffs into the lungs every 6 (six) hours as needed for wheezing or shortness of breath.          . ALPRAZolam (XANAX) 1 MG tablet   Oral   Take 1 tablet (1 mg total) by mouth 3 (three) times daily as needed for anxiety.   12 tablet   0   . desloratadine (CLARINEX) 5 MG tablet   Oral   Take 1 tablet (5 mg total) by mouth daily.   30 tablet   0   . estradiol (ESTRACE) 1 MG tablet   Oral   Take 1 tablet (1 mg total) by mouth daily.   12 tablet   0   .  gabapentin (NEURONTIN) 300 MG capsule   Oral   Take 300 mg by mouth 2 (two) times daily.         Marland Kitchen lisinopril (PRINIVIL,ZESTRIL) 5 MG tablet   Oral   Take 5 mg by mouth daily.         Marland Kitchen omeprazole (PRILOSEC) 20 MG capsule   Oral   Take 20 mg by mouth 2 (two) times daily.         . ondansetron (ZOFRAN-ODT) 4 MG disintegrating tablet   Oral   Take 4 mg by mouth every 8 (eight) hours as needed for nausea or vomiting.         . polyethylene glycol (MIRALAX) packet   Oral   Take 17 g by mouth daily.   14 each   0   . promethazine (PHENERGAN) 25 MG tablet   Oral   Take 25 mg by mouth every 6 (six) hours as needed for nausea or vomiting.          BP 104/51  Pulse 88  Temp(Src) 97.9 F (36.6 C) (Oral)  Resp 20  Ht 5\' 3"  (1.6 m)  Wt 115 lb (52.164 kg)  BMI 20.38 kg/m2  SpO2 98% Physical Exam  Nursing note and vitals reviewed. Constitutional: She is oriented to person, place, and time. She appears well-developed and well-nourished.  Non-toxic appearance.  HENT:  Head: Normocephalic.  Right Ear: Tympanic membrane and external ear normal.  Left Ear: Tympanic membrane and external ear normal.  Soreness to palpation of the occipital portion of the scalp. No palpable hematoma.  Eyes: EOM and lids are normal. Pupils are equal, round, and reactive to light.  Neck: Normal range of motion. Neck supple. Carotid bruit is not present.  Cervical collar in place. No palpable step off of the cervical spine. There is some tightness and tenderness of the paraspinal area of the cervical region. The trachea is in the midline.  Cardiovascular: Normal rate, regular rhythm, normal heart sounds, intact distal pulses and normal pulses.   Pulmonary/Chest: Breath sounds normal. No respiratory distress.  Abdominal: Soft. Bowel sounds are normal. There is no tenderness. There is no guarding.  Musculoskeletal: Normal range of motion.  There is pain to palpation along the lumbar spine. There is  paraspinal area tenderness to palpation and with change of position. There is no palpable step off of the lumbar spine. There no hot areas appreciated.  Lymphadenopathy:       Head (right side): No submandibular adenopathy present.       Head (left side): No submandibular adenopathy present.    She has no cervical adenopathy.  Neurological: She is alert and oriented to person, place, and  time. She has normal strength. No cranial nerve deficit or sensory deficit. She exhibits normal muscle tone. Coordination normal.  Skin: Skin is warm and dry.  Psychiatric: She has a normal mood and affect. Her speech is normal.    ED Course  Procedures (including critical care time) Labs Review Labs Reviewed  URINALYSIS, ROUTINE W REFLEX MICROSCOPIC   Imaging Review No results found.  EKG Interpretation   None       MDM   Final diagnoses:  None    *I have reviewed nursing notes, vital signs, and all appropriate lab and imaging results for this patient.**  Patient states she tripped over a rug at home, fell and injured her head, neck, and back. States that her legs feel numb. No loss of bowel or bladder function.  Urine negative for occult blood. CT head, neck, and l spine all negative for acute problem.  Some spurring and degenerative changes noted. Pt is ambulatory. Plan - Pt treated with prednisone and robaxin. Pt to follow up with pcp.  Lenox Ahr, PA-C 09/05/13 [phone]

## 2013-09-04 NOTE — Discharge Instructions (Signed)
The scans of the head, face, neck, and lumbar spine are negative for fracture or acute problem. Some arthritis changes noted. Please see Dr Jeanie Cooks for recheck and management of these issues.

## 2013-09-06 NOTE — ED Provider Notes (Signed)
Medical screening examination/treatment/procedure(s) were performed by non-physician practitioner and as supervising physician I was immediately available for consultation/collaboration.  EKG Interpretation   None         Ronnae Kaser M Esthefany Herrig, DO 09/06/13 1133 

## 2013-10-01 ENCOUNTER — Telehealth: Payer: Self-pay

## 2013-10-01 NOTE — Telephone Encounter (Signed)
Pt called- left voicemail- she again is wanting a letter to take to pain management to get her pain meds changed. Pt called 06/02/13 with the same request and AS stated pt needed ov. Pt was scheduled for 06/24/13 and no showed for that appointment.   Manuela Schwartz please schedule pt ov.

## 2013-10-02 ENCOUNTER — Encounter: Payer: Self-pay | Admitting: Gastroenterology

## 2013-10-02 NOTE — Telephone Encounter (Signed)
I called the patient to notify her that she needs to be seen in the office.  She no showed for her last two appts.  Patient is aware of appt.

## 2013-10-02 NOTE — Telephone Encounter (Signed)
Pt has OV for 4/8 at 11 with AS and appt card was mailed

## 2013-10-29 ENCOUNTER — Ambulatory Visit: Payer: Medicaid Other | Admitting: Gastroenterology

## 2013-10-29 ENCOUNTER — Encounter: Payer: Self-pay | Admitting: Gastroenterology

## 2013-10-29 ENCOUNTER — Telehealth: Payer: Self-pay | Admitting: Gastroenterology

## 2013-10-29 NOTE — Telephone Encounter (Signed)
Pt was a no show

## 2013-10-29 NOTE — Telephone Encounter (Signed)
MAILED LETTER °

## 2013-10-31 ENCOUNTER — Emergency Department (HOSPITAL_COMMUNITY)
Admission: EM | Admit: 2013-10-31 | Discharge: 2013-10-31 | Disposition: A | Discharge status: Home / Self Care | Payer: Medicaid Other | Attending: Emergency Medicine | Admitting: Emergency Medicine

## 2013-10-31 ENCOUNTER — Encounter (HOSPITAL_COMMUNITY): Payer: Self-pay | Admitting: Emergency Medicine

## 2013-10-31 DIAGNOSIS — I252 Old myocardial infarction: Secondary | ICD-10-CM | POA: Insufficient documentation

## 2013-10-31 DIAGNOSIS — K219 Gastro-esophageal reflux disease without esophagitis: Secondary | ICD-10-CM | POA: Insufficient documentation

## 2013-10-31 DIAGNOSIS — I2510 Atherosclerotic heart disease of native coronary artery without angina pectoris: Secondary | ICD-10-CM | POA: Insufficient documentation

## 2013-10-31 DIAGNOSIS — F17200 Nicotine dependence, unspecified, uncomplicated: Secondary | ICD-10-CM | POA: Insufficient documentation

## 2013-10-31 DIAGNOSIS — F319 Bipolar disorder, unspecified: Secondary | ICD-10-CM | POA: Insufficient documentation

## 2013-10-31 DIAGNOSIS — Z8742 Personal history of other diseases of the female genital tract: Secondary | ICD-10-CM | POA: Insufficient documentation

## 2013-10-31 DIAGNOSIS — Z8739 Personal history of other diseases of the musculoskeletal system and connective tissue: Secondary | ICD-10-CM | POA: Insufficient documentation

## 2013-10-31 DIAGNOSIS — R52 Pain, unspecified: Secondary | ICD-10-CM | POA: Insufficient documentation

## 2013-10-31 DIAGNOSIS — Z8543 Personal history of malignant neoplasm of ovary: Secondary | ICD-10-CM | POA: Insufficient documentation

## 2013-10-31 DIAGNOSIS — IMO0001 Reserved for inherently not codable concepts without codable children: Secondary | ICD-10-CM | POA: Insufficient documentation

## 2013-10-31 DIAGNOSIS — Z79899 Other long term (current) drug therapy: Secondary | ICD-10-CM | POA: Insufficient documentation

## 2013-10-31 DIAGNOSIS — G8929 Other chronic pain: Secondary | ICD-10-CM | POA: Insufficient documentation

## 2013-10-31 DIAGNOSIS — Z85118 Personal history of other malignant neoplasm of bronchus and lung: Secondary | ICD-10-CM | POA: Insufficient documentation

## 2013-10-31 DIAGNOSIS — Z8505 Personal history of malignant neoplasm of liver: Secondary | ICD-10-CM | POA: Insufficient documentation

## 2013-10-31 MED ORDER — HYDROMORPHONE HCL PF 1 MG/ML IJ SOLN
1.0000 mg | Freq: Once | INTRAMUSCULAR | Status: AC
  Administered 2013-10-31: 1 mg via INTRAMUSCULAR
  Filled 2013-10-31: qty 1

## 2013-10-31 MED ORDER — ONDANSETRON 8 MG PO TBDP: ORAL_TABLET | ORAL | Status: DC

## 2013-10-31 MED ORDER — ONDANSETRON 8 MG PO TBDP
8.0000 mg | ORAL_TABLET | Freq: Once | ORAL | Status: AC
  Administered 2013-10-31: 8 mg via ORAL
  Filled 2013-10-31: qty 1

## 2013-10-31 NOTE — ED Notes (Addendum)
Headache, "skeletal pain", chest hurts, Hx of liver , lung cancer,  Uterine cancer,  Lumpectomy for breast surgery  N/V  "I'm not peeing enough"  vomiting

## 2013-10-31 NOTE — ED Notes (Signed)
Pt alert & oriented x4, stable gait. Patient given discharge instructions, paperwork & prescription(s). Patient  instructed to stop at the registration desk to finish any additional paperwork. Patient verbalized understanding. Pt left department w/ no further questions. 

## 2013-10-31 NOTE — ED Provider Notes (Signed)
CSN: [id number]     Arrival date & time 10/31/13  1024 History   First MD Initiated Contact with Patient 10/31/13 1313     No chief complaint on file.  Chief Complaint:   Generalized body pain.  (Consider location/radiation/quality/duration/timing/severity/associated sxs/prior Treatment) HPI...... patient complains of generalized body pain. She claims to have several forms of cancer.   Additionally she sees a pain management in [place].   Next appointment is Monday.  No chest pain, dyspnea, dysuria, fever, chills. Severity is moderate. Nothing makes symptoms better or worse.  Past Medical History  Diagnosis Date  . Bipolar 1 disorder   . PTSD (post-traumatic stress disorder)   . Fibromyalgia   . Mass of left breast     breast cancer?   Marland Kitchen Arthritis   . Coronary artery disease   . Myocardial infarct   . Tumors   . Stomach ulcer   . Lung cancer   . Ovarian cancer     per patient  . GERD (gastroesophageal reflux disease)   . Liver cancer    Past Surgical History  Procedure Laterality Date  . Abdominal hysterectomy  2011  . Breast lumpectomy  2011  . Colonoscopy with esophagogastroduodenoscopy (egd) N/A 05/22/2013    WEX:HBZJIR EGD/Normal ileocolonoscopy-status post segmental biopsy and stool sampling. Patient's symptoms are out of proportion to endoscopic findings  . Hand surgery     Family History  Problem Relation Age of Onset  . Cancer Other   . Diabetes Other   . Colon cancer      uncles per patient  . Stomach cancer      uncles per patient   History  Substance Use Topics  . Smoking status: Current Every Day Smoker -- 0.50 packs/day    Types: Cigarettes  . Smokeless tobacco: Not on file     Comment: trying to wean off, quit.   . Alcohol Use: No   OB History   Grav Para Term Preterm Abortions TAB SAB Ect Mult Living   2 1 1  1  1   1      Review of Systems  All other systems reviewed and are negative.     Allergies  Aspirin; Ranitidine; Haldol;  Imitrex; Nsaids; Suboxone; Sulfa antibiotics; Toradol; Tylenol; and Ultram  Home Medications   Current Outpatient Rx  Name  Route  Sig  Dispense  Refill  . albuterol-ipratropium (COMBIVENT) 18-103 MCG/ACT inhaler   Inhalation   Inhale 2 puffs into the lungs every 6 (six) hours as needed for wheezing or shortness of breath.          . ALPRAZolam (XANAX) 1 MG tablet   Oral   Take 1 tablet (1 mg total) by mouth 3 (three) times daily as needed for anxiety.   12 tablet   0   . calcium carbonate (TUMS - DOSED IN MG ELEMENTAL CALCIUM) 500 MG chewable tablet   Oral   Chew 2 tablets by mouth daily as needed for indigestion or heartburn.         . Cyanocobalamin (VITAMIN B 12 PO)   Oral   Take 1 tablet by mouth daily.         Marland Kitchen desloratadine (CLARINEX) 5 MG tablet   Oral   Take 1 tablet (5 mg total) by mouth daily.   30 tablet   0   . estradiol (ESTRACE) 1 MG tablet   Oral   Take 1 tablet (1 mg total) by mouth daily.   12 tablet  0   . furosemide (LASIX) 20 MG tablet   Oral   Take 20 mg by mouth daily as needed for fluid.         Marland Kitchen gabapentin (NEURONTIN) 300 MG capsule   Oral   Take 300 mg by mouth 2 (two) times daily.         Marland Kitchen lisinopril (PRINIVIL,ZESTRIL) 5 MG tablet   Oral   Take 5 mg by mouth daily.         Marland Kitchen omeprazole (PRILOSEC) 20 MG capsule   Oral   Take 20 mg by mouth 2 (two) times daily.         . OxyCODONE (OXYCONTIN) 20 mg T12A 12 hr tablet   Oral   Take 20 mg by mouth every 6 (six) hours.         . polyethylene glycol (MIRALAX) packet   Oral   Take 17 g by mouth daily.   14 each   0    BP 104/54  Pulse 93  Temp(Src) 97.7 F (36.5 C) (Oral)  Resp 18  SpO2 97% Physical Exam  Nursing note and vitals reviewed. Constitutional: She is oriented to person, place, and time. She appears well-developed and well-nourished.  HENT:  Head: Normocephalic and atraumatic.  Eyes: Conjunctivae and EOM are normal. Pupils are equal, round, and  reactive to light.  Neck: Normal range of motion. Neck supple.  Cardiovascular: Normal rate, regular rhythm and normal heart sounds.   Pulmonary/Chest: Effort normal and breath sounds normal.  Abdominal: Soft. Bowel sounds are normal.  Musculoskeletal: Normal range of motion.  Neurological: She is alert and oriented to person, place, and time.  Skin: Skin is warm and dry.  Psychiatric: She has a normal mood and affect. Her behavior is normal.    ED Course  Procedures (including critical care time) DIAGNOSTIC STUDIES: Oxygen Saturation is 97% on room air,  Normal,  by my interpretation.    COORDINATION OF CARE: 4:08 PM- Pt advised of plan for treatment and pt agrees.    Labs Review Labs Reviewed - No data to display Imaging Review No results found.   EKG Interpretation None      MDM   Final diagnoses:  Chronic pain    Patient appears to be in no acute distress. No testing necessary. Dilaudid 1 mg IM given.  Low blood pressure noted.   Patient does not appear ill in any way.  I personally performed the services described in this documentation, which was scribed in my presence. The recorded information has been reviewed and is accurate.     Nat Christen, MD 10/31/13 [phone]

## 2013-10-31 NOTE — Discharge Instructions (Signed)
Follow up your dr

## 2013-11-02 ENCOUNTER — Emergency Department (HOSPITAL_COMMUNITY): Payer: Medicaid Other

## 2013-11-02 ENCOUNTER — Emergency Department (HOSPITAL_COMMUNITY)
Admission: EM | Admit: 2013-11-02 | Discharge: 2013-11-02 | Disposition: A | Discharge status: Home / Self Care | Payer: Medicaid Other | Attending: Emergency Medicine | Admitting: Emergency Medicine

## 2013-11-02 ENCOUNTER — Encounter (HOSPITAL_COMMUNITY): Payer: Self-pay | Admitting: Emergency Medicine

## 2013-11-02 DIAGNOSIS — Z79899 Other long term (current) drug therapy: Secondary | ICD-10-CM | POA: Insufficient documentation

## 2013-11-02 DIAGNOSIS — Z8739 Personal history of other diseases of the musculoskeletal system and connective tissue: Secondary | ICD-10-CM | POA: Insufficient documentation

## 2013-11-02 DIAGNOSIS — K219 Gastro-esophageal reflux disease without esophagitis: Secondary | ICD-10-CM | POA: Insufficient documentation

## 2013-11-02 DIAGNOSIS — Y9389 Activity, other specified: Secondary | ICD-10-CM | POA: Insufficient documentation

## 2013-11-02 DIAGNOSIS — W010XXA Fall on same level from slipping, tripping and stumbling without subsequent striking against object, initial encounter: Secondary | ICD-10-CM | POA: Insufficient documentation

## 2013-11-02 DIAGNOSIS — R519 Headache, unspecified: Secondary | ICD-10-CM

## 2013-11-02 DIAGNOSIS — H538 Other visual disturbances: Secondary | ICD-10-CM | POA: Insufficient documentation

## 2013-11-02 DIAGNOSIS — Z8543 Personal history of malignant neoplasm of ovary: Secondary | ICD-10-CM | POA: Insufficient documentation

## 2013-11-02 DIAGNOSIS — IMO0001 Reserved for inherently not codable concepts without codable children: Secondary | ICD-10-CM | POA: Insufficient documentation

## 2013-11-02 DIAGNOSIS — Z8742 Personal history of other diseases of the female genital tract: Secondary | ICD-10-CM | POA: Insufficient documentation

## 2013-11-02 DIAGNOSIS — Y92009 Unspecified place in unspecified non-institutional (private) residence as the place of occurrence of the external cause: Secondary | ICD-10-CM | POA: Insufficient documentation

## 2013-11-02 DIAGNOSIS — R51 Headache: Secondary | ICD-10-CM | POA: Insufficient documentation

## 2013-11-02 DIAGNOSIS — F319 Bipolar disorder, unspecified: Secondary | ICD-10-CM | POA: Insufficient documentation

## 2013-11-02 DIAGNOSIS — I2510 Atherosclerotic heart disease of native coronary artery without angina pectoris: Secondary | ICD-10-CM | POA: Insufficient documentation

## 2013-11-02 DIAGNOSIS — Z8505 Personal history of malignant neoplasm of liver: Secondary | ICD-10-CM | POA: Insufficient documentation

## 2013-11-02 DIAGNOSIS — I252 Old myocardial infarction: Secondary | ICD-10-CM | POA: Insufficient documentation

## 2013-11-02 DIAGNOSIS — F17200 Nicotine dependence, unspecified, uncomplicated: Secondary | ICD-10-CM | POA: Insufficient documentation

## 2013-11-02 DIAGNOSIS — Z85118 Personal history of other malignant neoplasm of bronchus and lung: Secondary | ICD-10-CM | POA: Insufficient documentation

## 2013-11-02 DIAGNOSIS — Z043 Encounter for examination and observation following other accident: Secondary | ICD-10-CM | POA: Insufficient documentation

## 2013-11-02 DIAGNOSIS — R112 Nausea with vomiting, unspecified: Secondary | ICD-10-CM | POA: Insufficient documentation

## 2013-11-02 LAB — CBC WITH DIFFERENTIAL/PLATELET
Basophils Absolute: 0.1 10*3/uL (ref 0.0–0.1)
Basophils Relative: 1 % (ref 0–1)
Eosinophils Absolute: 0.7 10*3/uL (ref 0.0–0.7)
Eosinophils Relative: 9 % — ABNORMAL HIGH (ref 0–5)
HCT: 37.1 % (ref 36.0–46.0)
Hemoglobin: 12.2 g/dL (ref 12.0–15.0)
Lymphocytes Relative: 47 % — ABNORMAL HIGH (ref 12–46)
Lymphs Abs: 3.8 10*3/uL (ref 0.7–4.0)
MCH: 31 pg (ref 26.0–34.0)
MCHC: 32.9 g/dL (ref 30.0–36.0)
MCV: 94.4 fL (ref 78.0–100.0)
Monocytes Absolute: 0.6 10*3/uL (ref 0.1–1.0)
Monocytes Relative: 7 % (ref 3–12)
Neutro Abs: 3 10*3/uL (ref 1.7–7.7)
Neutrophils Relative %: 37 % — ABNORMAL LOW (ref 43–77)
Platelets: 342 10*3/uL (ref 150–400)
RBC: 3.93 MIL/uL (ref 3.87–5.11)
RDW: 14.1 % (ref 11.5–15.5)
WBC: 8.1 10*3/uL (ref 4.0–10.5)

## 2013-11-02 LAB — BASIC METABOLIC PANEL
BUN: 9 mg/dL (ref 6–23)
CO2: 28 mEq/L (ref 19–32)
Calcium: 9 mg/dL (ref 8.4–10.5)
Chloride: 105 mEq/L (ref 96–112)
Creatinine, Ser: 0.84 mg/dL (ref 0.50–1.10)
GFR calc Af Amer: >90 mL/min (ref >90)
GFR calc non Af Amer: 83 mL/min — ABNORMAL LOW (ref >90)
Glucose, Bld: 100 mg/dL — ABNORMAL HIGH (ref 70–99)
Potassium: 4 mEq/L (ref 3.7–5.3)
Sodium: 141 mEq/L (ref 137–147)

## 2013-11-02 MED ORDER — METOCLOPRAMIDE HCL 5 MG/ML IJ SOLN
10.0000 mg | Freq: Once | INTRAMUSCULAR | Status: AC
  Administered 2013-11-02: 10 mg via INTRAVENOUS
  Filled 2013-11-02: qty 2

## 2013-11-02 MED ORDER — DIPHENHYDRAMINE HCL 50 MG/ML IJ SOLN
25.0000 mg | Freq: Once | INTRAMUSCULAR | Status: AC
  Administered 2013-11-02: 25 mg via INTRAVENOUS
  Filled 2013-11-02: qty 1

## 2013-11-02 NOTE — ED Notes (Signed)
Patient refused to sign discharge papers. When asked what her pain level is at discharge patient stated "Who cares?" Patient jerked discharge papers from this nurse and stomped out of the room to the exit.

## 2013-11-02 NOTE — Discharge Instructions (Signed)

## 2013-11-02 NOTE — ED Notes (Signed)
Patient ambulating with no assistance or difficulty in room at this time.

## 2013-11-02 NOTE — ED Provider Notes (Signed)
CSN: [id number]     Arrival date & time 11/02/13  2117 History  This chart was scribed for Virgel Manifold, MD by Jenne Campus, ED Scribe. This patient was seen in room APA18/APA18 and the patient's care was started at 9:53 PM.     Chief Complaint  Patient presents with  . Numbness     The history is provided by the patient. No language interpreter was used.    HPI Comments: Danielle Swanson is a 43 y.o. female brought in by ambulance, who presents to the Emergency Department complaining of right-sided arm and leg numbness with associated blurred vision in the right eye that started after a trip and fall about 4 hours ago.She reports that she tripped and fell at home and landed on the floor. She denies LOC. She states that she noted numbness about 15 minutes after the fall. She reports that she has experienced similar symptoms with her prior CVA. She denies any noted weakness to either extremity.   She has a secondary complaint of HA described as pressure for the past week with associated nausea and emesis for the past 3 days. She has been intolerant of foods but has been able to drink Gatorade and [facility]. She was seen in the ED 2 days ago for generalized body aches and made no mention of the symptoms during that visit. She denies any fevers or chills.   Past Medical History  Diagnosis Date  . Bipolar 1 disorder   . PTSD (post-traumatic stress disorder)   . Fibromyalgia   . Mass of left breast     breast cancer?   Marland Kitchen Arthritis   . Coronary artery disease   . Myocardial infarct   . Tumors   . Stomach ulcer   . Lung cancer   . Ovarian cancer     per patient  . GERD (gastroesophageal reflux disease)   . Liver cancer    Past Surgical History  Procedure Laterality Date  . Abdominal hysterectomy  2011  . Breast lumpectomy  2011  . Colonoscopy with esophagogastroduodenoscopy (egd) N/A 05/22/2013    OYD:XAJOIN EGD/Normal ileocolonoscopy-status post segmental biopsy and stool  sampling. Patient's symptoms are out of proportion to endoscopic findings  . Hand surgery     Family History  Problem Relation Age of Onset  . Cancer Other   . Diabetes Other   . Colon cancer      uncles per patient  . Stomach cancer      uncles per patient   History  Substance Use Topics  . Smoking status: Current Every Day Smoker -- 0.50 packs/day    Types: Cigarettes  . Smokeless tobacco: Not on file     Comment: trying to wean off, quit.   . Alcohol Use: Yes   OB History   Grav Para Term Preterm Abortions TAB SAB Ect Mult Living   2 1 1  1  1   1      Review of Systems  Constitutional: Negative for fever and chills.  Eyes: Positive for visual disturbance (right eye).  Gastrointestinal: Positive for nausea and vomiting.  Neurological: Positive for numbness and headaches. Negative for weakness.  All other systems reviewed and are negative.  Allergies  Aspirin; Ranitidine; Haldol; Imitrex; Nsaids; Suboxone; Sulfa antibiotics; Toradol; Tylenol; and Ultram  Home Medications   Current Outpatient Rx  Name  Route  Sig  Dispense  Refill  . albuterol-ipratropium (COMBIVENT) 18-103 MCG/ACT inhaler   Inhalation   Inhale 2 puffs  into the lungs every 6 (six) hours as needed for wheezing or shortness of breath.          . ALPRAZolam (XANAX) 1 MG tablet   Oral   Take 1 tablet (1 mg total) by mouth 3 (three) times daily as needed for anxiety.   12 tablet   0   . Cyanocobalamin (VITAMIN B 12 PO)   Oral   Take 1 tablet by mouth daily.         Marland Kitchen desloratadine (CLARINEX) 5 MG tablet   Oral   Take 1 tablet (5 mg total) by mouth daily.   30 tablet   0   . estradiol (ESTRACE) 1 MG tablet   Oral   Take 1 tablet (1 mg total) by mouth daily.   12 tablet   0   . furosemide (LASIX) 20 MG tablet   Oral   Take 20 mg by mouth daily as needed for fluid.         Marland Kitchen gabapentin (NEURONTIN) 300 MG capsule   Oral   Take 300 mg by mouth 3 (three) times daily.          Marland Kitchen  lisinopril (PRINIVIL,ZESTRIL) 10 MG tablet   Oral   Take 10 mg by mouth daily.         Marland Kitchen omeprazole (PRILOSEC) 20 MG capsule   Oral   Take 20 mg by mouth 2 (two) times daily.         . ondansetron (ZOFRAN-ODT) 8 MG disintegrating tablet   Oral   Take 8 mg by mouth every 4 (four) hours as needed for nausea or vomiting.         . OxyCODONE (OXYCONTIN) 20 mg T12A 12 hr tablet   Oral   Take 20 mg by mouth 4 (four) times daily.          . polyethylene glycol (MIRALAX) packet   Oral   Take 17 g by mouth daily.   14 each   0   . calcium carbonate (TUMS - DOSED IN MG ELEMENTAL CALCIUM) 500 MG chewable tablet   Oral   Chew 2 tablets by mouth daily as needed for indigestion or heartburn.          Triage Vitals: BP 120/59  Pulse 84  Temp(Src) 98.2 F (36.8 C) (Oral)  Resp 20  Ht 5' 3.5" (1.613 m)  Wt 123 lb (55.792 kg)  BMI 21.44 kg/m2  SpO2 100%  Physical Exam  Nursing note and vitals reviewed. Constitutional: She is oriented to person, place, and time. She appears well-developed and well-nourished. No distress.  HENT:  Head: Normocephalic and atraumatic.  Eyes: Conjunctivae and EOM are normal. Pupils are equal, round, and reactive to light.  Neck: Normal range of motion.  Cardiovascular: Normal rate, regular rhythm and normal heart sounds.   Pulmonary/Chest: Effort normal and breath sounds normal.  Abdominal: Soft. She exhibits no distension. There is no tenderness.  Musculoskeletal: Normal range of motion.  Neurological: She is alert and oriented to person, place, and time. No cranial nerve deficit. She exhibits normal muscle tone. Coordination normal.  Normal strength in upper and lower extremities. Gait steady.   Skin: Skin is warm and dry.  Psychiatric: She has a normal mood and affect. Judgment normal.    ED Course  Procedures (including critical care time)  Medications  metoCLOPramide (REGLAN) injection 10 mg (not administered)  diphenhydrAMINE  (BENADRYL) injection 25 mg (not administered)    DIAGNOSTIC STUDIES: Oxygen  Saturation is 100% on RA, normal by my interpretation.    COORDINATION OF CARE: 10:00 PM-Discussed treatment plan which includes medications, CT of head, CBC panel and BMP with pt at bedside and pt agreed to plan.   Labs Review Labs Reviewed - No data to display Imaging Review No results found.   EKG Interpretation None      MDM   Final diagnoses:  Headache    43yF with headache. Suspect primary HA. Consider emergent secondary causes such as bleed, infectious or mass but doubt. There is no history of trauma. Pt has a nonfocal neurological exam. Afebrile and neck supple. Pt reports hx of multiple types of cancer but I could not objectively substantiate her claims through my review of her records. No use of blood thinning medication. Consider ocular etiology such as acute angle closure glaucoma but doubt. Pt denies acute change in visual acuity and eye exam unremarkable. Doubt temporal arteritis given age, no temporal tenderness and temporal artery pulsations palpable. Doubt CO poisoning. No contacts with similar symptoms. Doubt venous thrombosis. Doubt carotid or vertebral arteries dissection. Symptoms improved with meds although pt continues to press for narcotics. Discussed that I do not routinely treat headaches with them.. Feel that can be safely discharged, but strict return precautions discussed. Outpt fu.      Virgel Manifold, MD 11/10/13 [phone]

## 2013-11-02 NOTE — ED Notes (Signed)
Per EMS, patient c/o right sided arm and right sided leg numbness.  Patient c/o headache for a week.

## 2013-11-12 ENCOUNTER — Encounter: Payer: Self-pay | Admitting: Gastroenterology

## 2013-11-12 ENCOUNTER — Ambulatory Visit: Payer: Medicaid Other | Admitting: Gastroenterology

## 2013-11-12 ENCOUNTER — Telehealth: Payer: Self-pay | Admitting: Gastroenterology

## 2013-11-12 NOTE — Telephone Encounter (Signed)
Pt was a no show

## 2013-11-12 NOTE — Telephone Encounter (Signed)
Mailed letter °

## 2013-11-12 NOTE — Telephone Encounter (Signed)
discharge

## 2013-11-12 NOTE — Telephone Encounter (Signed)
Certified letter mailed today

## 2013-11-12 NOTE — Telephone Encounter (Signed)
I think we can discharge for being non-productive.  Please just give the order for me to send d/c letter.  I'm also sending this to Dr. Gala Romney to get his opinion.  Routing to Dr. Gala Romney.

## 2013-11-12 NOTE — Telephone Encounter (Signed)
Three no shows since 06/2013, two this month. She did not have abd u/s recommended by Dr. Gala Swanson after EGD/TCS last year.  Do we need to discharge from practice???

## 2013-11-27 ENCOUNTER — Emergency Department (HOSPITAL_COMMUNITY)
Admission: EM | Admit: 2013-11-27 | Discharge: 2013-11-27 | Discharge status: Against Medical Advice | Payer: Medicaid Other | Source: Home / Self Care | Attending: Emergency Medicine | Admitting: Emergency Medicine

## 2013-11-27 ENCOUNTER — Encounter (HOSPITAL_COMMUNITY): Payer: Self-pay | Admitting: Emergency Medicine

## 2013-11-27 ENCOUNTER — Emergency Department (HOSPITAL_COMMUNITY): Payer: Medicaid Other

## 2013-11-27 DIAGNOSIS — R000 Tachycardia, unspecified: Secondary | ICD-10-CM | POA: Diagnosis present

## 2013-11-27 DIAGNOSIS — Z91013 Allergy to seafood: Secondary | ICD-10-CM

## 2013-11-27 DIAGNOSIS — F1120 Opioid dependence, uncomplicated: Secondary | ICD-10-CM | POA: Diagnosis present

## 2013-11-27 DIAGNOSIS — F1123 Opioid dependence with withdrawal: Secondary | ICD-10-CM

## 2013-11-27 DIAGNOSIS — K259 Gastric ulcer, unspecified as acute or chronic, without hemorrhage or perforation: Secondary | ICD-10-CM | POA: Insufficient documentation

## 2013-11-27 DIAGNOSIS — Z8505 Personal history of malignant neoplasm of liver: Secondary | ICD-10-CM

## 2013-11-27 DIAGNOSIS — Z886 Allergy status to analgesic agent status: Secondary | ICD-10-CM

## 2013-11-27 DIAGNOSIS — Z79899 Other long term (current) drug therapy: Secondary | ICD-10-CM | POA: Insufficient documentation

## 2013-11-27 DIAGNOSIS — E872 Acidosis, unspecified: Secondary | ICD-10-CM | POA: Diagnosis present

## 2013-11-27 DIAGNOSIS — K219 Gastro-esophageal reflux disease without esophagitis: Secondary | ICD-10-CM | POA: Diagnosis present

## 2013-11-27 DIAGNOSIS — X75XXXA Intentional self-harm by explosive material, initial encounter: Secondary | ICD-10-CM

## 2013-11-27 DIAGNOSIS — R079 Chest pain, unspecified: Secondary | ICD-10-CM

## 2013-11-27 DIAGNOSIS — J95811 Postprocedural pneumothorax: Secondary | ICD-10-CM | POA: Diagnosis present

## 2013-11-27 DIAGNOSIS — Z91199 Patient's noncompliance with other medical treatment and regimen due to unspecified reason: Secondary | ICD-10-CM

## 2013-11-27 DIAGNOSIS — F39 Unspecified mood [affective] disorder: Secondary | ICD-10-CM | POA: Diagnosis present

## 2013-11-27 DIAGNOSIS — Z8543 Personal history of malignant neoplasm of ovary: Secondary | ICD-10-CM

## 2013-11-27 DIAGNOSIS — D72829 Elevated white blood cell count, unspecified: Secondary | ICD-10-CM | POA: Insufficient documentation

## 2013-11-27 DIAGNOSIS — G8918 Other acute postprocedural pain: Secondary | ICD-10-CM | POA: Diagnosis not present

## 2013-11-27 DIAGNOSIS — Z85118 Personal history of other malignant neoplasm of bronchus and lung: Secondary | ICD-10-CM

## 2013-11-27 DIAGNOSIS — I2510 Atherosclerotic heart disease of native coronary artery without angina pectoris: Secondary | ICD-10-CM

## 2013-11-27 DIAGNOSIS — Z853 Personal history of malignant neoplasm of breast: Secondary | ICD-10-CM

## 2013-11-27 DIAGNOSIS — F22 Delusional disorders: Secondary | ICD-10-CM | POA: Diagnosis present

## 2013-11-27 DIAGNOSIS — F4310 Post-traumatic stress disorder, unspecified: Secondary | ICD-10-CM | POA: Diagnosis present

## 2013-11-27 DIAGNOSIS — Z9119 Patient's noncompliance with other medical treatment and regimen: Secondary | ICD-10-CM

## 2013-11-27 DIAGNOSIS — I959 Hypotension, unspecified: Secondary | ICD-10-CM | POA: Diagnosis present

## 2013-11-27 DIAGNOSIS — Z888 Allergy status to other drugs, medicaments and biological substances status: Secondary | ICD-10-CM

## 2013-11-27 DIAGNOSIS — S32309A Unspecified fracture of unspecified ilium, initial encounter for closed fracture: Secondary | ICD-10-CM | POA: Diagnosis present

## 2013-11-27 DIAGNOSIS — F319 Bipolar disorder, unspecified: Secondary | ICD-10-CM | POA: Insufficient documentation

## 2013-11-27 DIAGNOSIS — F1193 Opioid use, unspecified with withdrawal: Secondary | ICD-10-CM

## 2013-11-27 DIAGNOSIS — R45851 Suicidal ideations: Secondary | ICD-10-CM

## 2013-11-27 DIAGNOSIS — N179 Acute kidney failure, unspecified: Secondary | ICD-10-CM | POA: Diagnosis present

## 2013-11-27 DIAGNOSIS — G4700 Insomnia, unspecified: Secondary | ICD-10-CM | POA: Diagnosis present

## 2013-11-27 DIAGNOSIS — I252 Old myocardial infarction: Secondary | ICD-10-CM | POA: Insufficient documentation

## 2013-11-27 DIAGNOSIS — Z882 Allergy status to sulfonamides status: Secondary | ICD-10-CM

## 2013-11-27 DIAGNOSIS — F411 Generalized anxiety disorder: Secondary | ICD-10-CM | POA: Diagnosis present

## 2013-11-27 DIAGNOSIS — Z8739 Personal history of other diseases of the musculoskeletal system and connective tissue: Secondary | ICD-10-CM

## 2013-11-27 DIAGNOSIS — X749XXA Intentional self-harm by unspecified firearm discharge, initial encounter: Secondary | ICD-10-CM | POA: Diagnosis present

## 2013-11-27 DIAGNOSIS — F1994 Other psychoactive substance use, unspecified with psychoactive substance-induced mood disorder: Secondary | ICD-10-CM | POA: Diagnosis present

## 2013-11-27 DIAGNOSIS — F17200 Nicotine dependence, unspecified, uncomplicated: Secondary | ICD-10-CM | POA: Insufficient documentation

## 2013-11-27 DIAGNOSIS — S31809A Unspecified open wound of unspecified buttock, initial encounter: Secondary | ICD-10-CM | POA: Diagnosis present

## 2013-11-27 DIAGNOSIS — G8929 Other chronic pain: Secondary | ICD-10-CM | POA: Diagnosis present

## 2013-11-27 DIAGNOSIS — Z885 Allergy status to narcotic agent status: Secondary | ICD-10-CM

## 2013-11-27 DIAGNOSIS — Z91018 Allergy to other foods: Secondary | ICD-10-CM

## 2013-11-27 DIAGNOSIS — K659 Peritonitis, unspecified: Secondary | ICD-10-CM | POA: Diagnosis present

## 2013-11-27 DIAGNOSIS — IMO0001 Reserved for inherently not codable concepts without codable children: Secondary | ICD-10-CM | POA: Diagnosis present

## 2013-11-27 DIAGNOSIS — D62 Acute posthemorrhagic anemia: Secondary | ICD-10-CM | POA: Diagnosis present

## 2013-11-27 DIAGNOSIS — I10 Essential (primary) hypertension: Secondary | ICD-10-CM | POA: Diagnosis present

## 2013-11-27 DIAGNOSIS — IMO0002 Reserved for concepts with insufficient information to code with codable children: Principal | ICD-10-CM | POA: Diagnosis present

## 2013-11-27 DIAGNOSIS — K560 Paralytic ileus: Secondary | ICD-10-CM | POA: Diagnosis not present

## 2013-11-27 LAB — CBC WITH DIFFERENTIAL/PLATELET
Basophils Absolute: 0.1 10*3/uL (ref 0.0–0.1)
Basophils Relative: 0 % (ref 0–1)
EOS ABS: 0.3 10*3/uL (ref 0.0–0.7)
EOS PCT: 1 % (ref 0–5)
HCT: 42.8 % (ref 36.0–46.0)
HEMOGLOBIN: 14.8 g/dL (ref 12.0–15.0)
LYMPHS ABS: 5.1 10*3/uL — AB (ref 0.7–4.0)
Lymphocytes Relative: 24 % (ref 12–46)
MCH: 32.1 pg (ref 26.0–34.0)
MCHC: 34.6 g/dL (ref 30.0–36.0)
MCV: 92.8 fL (ref 78.0–100.0)
MONOS PCT: 7 % (ref 3–12)
Monocytes Absolute: 1.6 10*3/uL — ABNORMAL HIGH (ref 0.1–1.0)
Neutro Abs: 14.6 10*3/uL — ABNORMAL HIGH (ref 1.7–7.7)
Neutrophils Relative %: 68 % (ref 43–77)
Platelets: 462 10*3/uL — ABNORMAL HIGH (ref 150–400)
RBC: 4.61 MIL/uL (ref 3.87–5.11)
RDW: 14.1 % (ref 11.5–15.5)
WBC: 21.7 10*3/uL — ABNORMAL HIGH (ref 4.0–10.5)

## 2013-11-27 LAB — COMPREHENSIVE METABOLIC PANEL
ALT: 8 U/L (ref 0–35)
AST: 12 U/L (ref 0–37)
Albumin: 4 g/dL (ref 3.5–5.2)
Alkaline Phosphatase: 78 U/L (ref 39–117)
BUN: 15 mg/dL (ref 6–23)
CALCIUM: 9.5 mg/dL (ref 8.4–10.5)
CO2: 23 meq/L (ref 19–32)
CREATININE: 0.85 mg/dL (ref 0.50–1.10)
Chloride: 101 mEq/L (ref 96–112)
GFR calc Af Amer: >90 mL/min (ref >90)
GFR, EST NON AFRICAN AMERICAN: 82 mL/min — AB (ref >90)
GLUCOSE: 88 mg/dL (ref 70–99)
Potassium: 3.7 mEq/L (ref 3.7–5.3)
Sodium: 137 mEq/L (ref 137–147)
Total Bilirubin: 0.5 mg/dL (ref 0.3–1.2)
Total Protein: 7.4 g/dL (ref 6.0–8.3)

## 2013-11-27 LAB — TROPONIN I: Troponin I: <0.3 ng/mL (ref <0.30)

## 2013-11-27 NOTE — ED Notes (Signed)
Patient would like something for pain and nausea at this time.

## 2013-11-27 NOTE — ED Provider Notes (Signed)
CSN: [id number]     Arrival date & time 11/27/13  1312 History  This chart was scribed for Danielle Acosta, MD by Jenne Campus, ED Scribe. This patient was seen in room APA01/APA01 and the patient's care was started at 2:25 PM.   Chief Complaint  Patient presents with  . Chest Pain     The history is provided by the patient. No language interpreter was used.    HPI Comments: Danielle Swanson is a 43 y.o. female with a h/o liver, breast and ovarian CA with no current oncology follow up who presents to the Emergency Department complaining of intermittent CP described as a "clenching" with radiation down the left arm with associated left arm numbness and diarrhea described as "pure acid" that started last night. The CP is not associated with deep breathing and is non-pleuritic and non-exertional. She reports similar symptoms from opiate withdrawal with CP and associated diarrhea. 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 Pt states that she was started on Oxycodone 20 mg PRN shortly after stopping her cocaine use for OA, fibromyalgia and stress migraines but has since run out 3 days ago due to having a trace of cocaine in urine for a pain management assessment in December 2015. She states that due to this fact her opiates were not refilled. She states that she was not excused from clinic but is thinking of breaking her contract due find another clinic. She reports that her last visit was April 12th, 2015. She has a h/o MI in 2011 that was treated at an [place] [facility] with a subsequent angioplasty and stent placement. 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  Lives with dad  Past Medical History  Diagnosis Date  . Bipolar 1 disorder   . PTSD (post-traumatic stress disorder)   . Fibromyalgia   . Mass of left breast      breast cancer?   Marland Kitchen Arthritis   . Coronary artery disease   . Myocardial infarct   . Tumors   . Stomach ulcer   . Lung cancer   . Ovarian cancer     per patient  . GERD (gastroesophageal reflux disease)   . Liver cancer    Past Surgical History  Procedure Laterality Date  . Abdominal hysterectomy  2011  . Breast lumpectomy  2011  . Colonoscopy with esophagogastroduodenoscopy (egd) N/A 05/22/2013    WUJ:WJXBJY EGD/Normal ileocolonoscopy-status post segmental biopsy and stool sampling. Patient's symptoms are out of proportion to endoscopic findings  . Hand surgery     Family History  Problem Relation Age of Onset  . Cancer Other   . Diabetes Other   . Colon cancer      uncles per patient  . Stomach cancer      uncles per patient   History  Substance Use Topics  . Smoking status: Current Every Day Smoker -- 0.50 packs/day for 29 years    Types: Cigarettes  . Smokeless tobacco: Never Used     Comment: trying to wean off, quit.   . Alcohol Use: No   OB History   Grav Para Term Preterm Abortions TAB SAB Ect Mult Living   2 1 1  1  1   1      Review of Systems  A complete 10 system  review of systems was obtained and all systems are negative except as noted in the HPI and PMH.    Allergies  Aspirin; Ranitidine; Haldol; Imitrex; Nsaids; Suboxone; Sulfa antibiotics; Toradol; Tylenol; and Ultram  Home Medications   Prior to Admission medications   Medication Sig Start Date End Date Taking? Authorizing Provider  albuterol-ipratropium (COMBIVENT) 18-103 MCG/ACT inhaler Inhale 2 puffs into the lungs every 6 (six) hours as needed for wheezing or shortness of breath.    Yes Historical Provider, MD  ALPRAZolam Duanne Moron) 1 MG tablet Take 1 tablet (1 mg total) by mouth 3 (three) times daily as needed for anxiety. 02/19/13  Yes Resa Miner Lawyer, PA-C  desloratadine (CLARINEX) 5 MG tablet Take 1 tablet (5 mg total) by mouth daily. 04/15/12  Yes Evalee Jefferson, PA-C  estradiol  (ESTRACE) 1 MG tablet Take 1 tablet (1 mg total) by mouth daily. 02/03/13  Yes Tammy L. Triplett, PA-C  furosemide (LASIX) 20 MG tablet Take 20 mg by mouth daily as needed for fluid.   Yes Historical Provider, MD  gabapentin (NEURONTIN) 300 MG capsule Take 300 mg by mouth 3 (three) times daily.    Yes Historical Provider, MD  lisinopril (PRINIVIL,ZESTRIL) 10 MG tablet Take 10 mg by mouth daily.   Yes Historical Provider, MD  omeprazole (PRILOSEC) 20 MG capsule Take 20 mg by mouth 2 (two) times daily.   Yes Historical Provider, MD  sodium phosphate (FLEET) enema Place 1 enema rectally once. follow package directions   Yes Historical Provider, MD  OxyCODONE (OXYCONTIN) 20 mg T12A 12 hr tablet Take 20 mg by mouth 4 (four) times daily.     Historical Provider, MD  polyethylene glycol (MIRALAX / GLYCOLAX) packet Take 17 g by mouth daily as needed for mild constipation.    Historical Provider, MD   Triage vitals: BP 104/56  Pulse 116  Temp(Src) 98.1 F (36.7 C) (Oral)  Resp 24  Ht 5\' 3"  (1.6 m)  Wt 119 lb (53.978 kg)  BMI 21.09 kg/m2  SpO2 98%  Physical Exam  Nursing note and vitals reviewed. Constitutional: She is oriented to person, place, and time. She appears well-developed and well-nourished. No distress.  HENT:  Head: Normocephalic and atraumatic.  Eyes: EOM are normal.  Neck: Neck supple. No tracheal deviation present.  Cardiovascular: Regular rhythm.   Mild tachycardia  Pulmonary/Chest: Effort normal and breath sounds normal. No respiratory distress.  Abdominal: Soft. There is no tenderness.  Musculoskeletal: Normal range of motion. She exhibits no edema.  Neurological: She is alert and oriented to person, place, and time.  Skin: Skin is warm and dry.  Psychiatric: She has a normal mood and affect. Her behavior is normal.    ED Course  Procedures (including critical care time)  DIAGNOSTIC STUDIES: Oxygen Saturation is 98% on RA, normal by my interpretation.    COORDINATION  OF CARE: 2:33 PM-EKG is normal. Advised pt that opiate withdrawal doesn't cause MI or CVA. Discussed treatment plan which includes CXR, CBC panel and CMP with pt at bedside and pt agreed to plan.   Labs Review Labs Reviewed  CBC WITH DIFFERENTIAL - Abnormal; Notable for the following:    WBC 21.7 (*)    Platelets 462 (*)    Neutro Abs 14.6 (*)    Lymphs Abs 5.1 (*)    Monocytes Absolute 1.6 (*)    All other components within normal limits  COMPREHENSIVE METABOLIC PANEL - Abnormal; Notable for the following:    GFR calc non Af  Amer 39 (*)    All other components within normal limits  TROPONIN I  URINALYSIS, ROUTINE W REFLEX MICROSCOPIC    Imaging Review Dg Chest 2 View  11/27/2013   CLINICAL DATA:  Chest pain.  EXAM: CHEST  2 VIEW  COMPARISON:  DG RIBS UNILATERAL W/CHEST*L* dated 11/22/2011  FINDINGS: The heart size and mediastinal contours are within normal limits. Both lungs are clear. The visualized skeletal structures are unremarkable.  IMPRESSION: No active cardiopulmonary disease.   Electronically Signed   By: Elon Alas   On: 11/27/2013 14:31     EKG Interpretation   Date/Time:  Thursday Nov 27 2013 13:27:28 EDT Ventricular Rate:  116 PR Interval:  138 QRS Duration: 74 QT Interval:  300 QTC Calculation: 417 R Axis:   57 Text Interpretation:  Sinus tachycardia Cannot rule out Anterior infarct ,  age undetermined Abnormal ECG Confirmed by COOK  MD, Zakirah Weingart ([id number]) on  11/27/2013 2:54:49 PM      MDM   Final diagnoses:  Chest pain  Opiate withdrawal  Leukocytosis   Initially the EKG showed a sinus tachycardia however on exam the patient is only mildly tachycardic with a pulse of 105. Her abdomen is soft and nontender, she is not hypotensive, febrile or hypoxic and her workup shows normal electrolytes and liver function, normal troponin and a chest x-ray which shows no acute findings. Her CBC confirmed a leukocytosis of unknown significance. I ordered a urinalysis to  see if there was a urinary tract infection possibly contributing to the patient's generalized ill feeling however she decided that she did not want to give a urine sample and left AGAINST MEDICAL ADVICE. I am unsure of the reason that the patient did not want a urine sample though I suspect she did not want to have a drug test today. A drug test was not ordered. The patient was warned of the risk of leaving [facility], she expressed her understanding and decided to leave anyway.   I personally performed the services described in this documentation, which was scribed in my presence. The recorded information has been reviewed and is accurate.       Danielle Acosta, MD 11/27/13 [phone]

## 2013-11-27 NOTE — ED Notes (Signed)
Pt called out for pain medication and nausea medication. Went to talk with pt and pt sitting up in bed states, " im leaving. If he isn't going to treat my pain and my addiction withdrawal I will just to go [facility]. I told him I was hurting and he didn't do anything for me" reminded pt Dr. Liana Gerold tylenol or ibuprofen and pt refused. Pt cursing while getting dressed. Charge nurse and doctor aware of pt leaving AMA.

## 2013-11-27 NOTE — ED Notes (Signed)
Patient c/o left chest pain that started yesterday. Patient reports shortness of breath, nausea, vomiting, and tingling in left arm. Patient reports hx of MI in past. Per patient also going through opiate withdrawals, reports liquid stools. Per patient last had opiates 3 days ago.

## 2013-11-28 ENCOUNTER — Emergency Department (HOSPITAL_COMMUNITY): Payer: Medicaid Other

## 2013-11-28 ENCOUNTER — Emergency Department (HOSPITAL_COMMUNITY): Payer: Medicaid Other | Admitting: Anesthesiology

## 2013-11-28 ENCOUNTER — Inpatient Hospital Stay (HOSPITAL_COMMUNITY)
Admission: EM | Admit: 2013-11-28 | Discharge: 2013-12-05 | DRG: 957 | Payer: Medicaid Other | Attending: General Surgery | Admitting: General Surgery

## 2013-11-28 ENCOUNTER — Encounter (HOSPITAL_COMMUNITY): Admission: EM | Payer: Self-pay | Source: Home / Self Care

## 2013-11-28 ENCOUNTER — Inpatient Hospital Stay (HOSPITAL_COMMUNITY): Payer: Medicaid Other

## 2013-11-28 ENCOUNTER — Encounter (HOSPITAL_COMMUNITY): Payer: Medicaid Other | Admitting: Anesthesiology

## 2013-11-28 DIAGNOSIS — Z789 Other specified health status: Secondary | ICD-10-CM | POA: Diagnosis present

## 2013-11-28 DIAGNOSIS — S31139A Puncture wound of abdominal wall without foreign body, unspecified quadrant without penetration into peritoneal cavity, initial encounter: Secondary | ICD-10-CM

## 2013-11-28 DIAGNOSIS — S71009A Unspecified open wound, unspecified hip, initial encounter: Secondary | ICD-10-CM

## 2013-11-28 DIAGNOSIS — S36409A Unspecified injury of unspecified part of small intestine, initial encounter: Secondary | ICD-10-CM | POA: Diagnosis present

## 2013-11-28 DIAGNOSIS — I959 Hypotension, unspecified: Secondary | ICD-10-CM

## 2013-11-28 DIAGNOSIS — S270XXA Traumatic pneumothorax, initial encounter: Secondary | ICD-10-CM

## 2013-11-28 DIAGNOSIS — T50905A Adverse effect of unspecified drugs, medicaments and biological substances, initial encounter: Secondary | ICD-10-CM

## 2013-11-28 DIAGNOSIS — IMO0002 Reserved for concepts with insufficient information to code with codable children: Secondary | ICD-10-CM

## 2013-11-28 DIAGNOSIS — W3400XA Accidental discharge from unspecified firearms or gun, initial encounter: Secondary | ICD-10-CM

## 2013-11-28 DIAGNOSIS — J95811 Postprocedural pneumothorax: Secondary | ICD-10-CM | POA: Diagnosis not present

## 2013-11-28 DIAGNOSIS — S32309A Unspecified fracture of unspecified ilium, initial encounter for closed fracture: Secondary | ICD-10-CM | POA: Diagnosis present

## 2013-11-28 DIAGNOSIS — D62 Acute posthemorrhagic anemia: Secondary | ICD-10-CM | POA: Diagnosis not present

## 2013-11-28 DIAGNOSIS — F4310 Post-traumatic stress disorder, unspecified: Secondary | ICD-10-CM | POA: Diagnosis present

## 2013-11-28 DIAGNOSIS — S71109A Unspecified open wound, unspecified thigh, initial encounter: Secondary | ICD-10-CM

## 2013-11-28 DIAGNOSIS — N179 Acute kidney failure, unspecified: Secondary | ICD-10-CM | POA: Diagnosis not present

## 2013-11-28 DIAGNOSIS — R571 Hypovolemic shock: Secondary | ICD-10-CM | POA: Diagnosis present

## 2013-11-28 DIAGNOSIS — F319 Bipolar disorder, unspecified: Secondary | ICD-10-CM

## 2013-11-28 HISTORY — PX: BOWEL RESECTION: SHX1257

## 2013-11-28 HISTORY — PX: LAPAROTOMY: SHX154

## 2013-11-28 HISTORY — PX: WOUND EXPLORATION: SHX6188

## 2013-11-28 HISTORY — DX: Essential (primary) hypertension: I10

## 2013-11-28 HISTORY — DX: Personal history of other mental and behavioral disorders: Z86.59

## 2013-11-28 HISTORY — PX: CHEST TUBE INSERTION: SHX231

## 2013-11-28 LAB — POCT I-STAT 3, ART BLOOD GAS (G3+)
Acid-base deficit: 3 mmol/L — ABNORMAL HIGH (ref 0.0–2.0)
Bicarbonate: 22.1 mEq/L (ref 20.0–24.0)
O2 Saturation: 93 %
PCO2 ART: 37.3 mmHg (ref 35.0–45.0)
TCO2: 23 mmol/L (ref 0–100)
pH, Arterial: 7.38 (ref 7.350–7.450)
pO2, Arterial: 66 mmHg — ABNORMAL LOW (ref 80.0–100.0)

## 2013-11-28 LAB — POCT I-STAT 7, (LYTES, BLD GAS, ICA,H+H)
ACID-BASE EXCESS: 1 mmol/L (ref 0.0–2.0)
Acid-base deficit: 11 mmol/L — ABNORMAL HIGH (ref 0.0–2.0)
Bicarbonate: 16.1 mEq/L — ABNORMAL LOW (ref 20.0–24.0)
Bicarbonate: 26.1 mEq/L — ABNORMAL HIGH (ref 20.0–24.0)
CALCIUM ION: 1.06 mmol/L — AB (ref 1.12–1.23)
CALCIUM ION: 0.75 mmol/L — AB (ref 1.12–1.23)
HCT: 33 % — ABNORMAL LOW (ref 36.0–46.0)
HEMATOCRIT: 32 % — AB (ref 36.0–46.0)
HEMOGLOBIN: 10.9 g/dL — AB (ref 12.0–15.0)
Hemoglobin: 11.2 g/dL — ABNORMAL LOW (ref 12.0–15.0)
O2 SAT: 100 %
O2 Saturation: 100 %
Patient temperature: 34
Potassium: 3.8 mEq/L (ref 3.7–5.3)
Potassium: 3.5 mEq/L — ABNORMAL LOW (ref 3.7–5.3)
SODIUM: 144 meq/L (ref 137–147)
Sodium: 138 mEq/L (ref 137–147)
TCO2: 17 mmol/L (ref 0–100)
TCO2: 27 mmol/L (ref 0–100)
pCO2 arterial: 38 mmHg (ref 35.0–45.0)
pCO2 arterial: 35.9 mmHg (ref 35.0–45.0)
pH, Arterial: 7.235 — ABNORMAL LOW (ref 7.350–7.450)
pH, Arterial: 7.457 — ABNORMAL HIGH (ref 7.350–7.450)
pO2, Arterial: 410 mmHg — ABNORMAL HIGH (ref 80.0–100.0)
pO2, Arterial: 336 mmHg — ABNORMAL HIGH (ref 80.0–100.0)

## 2013-11-28 LAB — I-STAT CHEM 8, ED
BUN: 19 mg/dL (ref 6–23)
CALCIUM ION: 1.1 mmol/L — AB (ref 1.12–1.23)
CHLORIDE: 104 meq/L (ref 96–112)
Creatinine, Ser: 1.4 mg/dL — ABNORMAL HIGH (ref 0.50–1.10)
Glucose, Bld: 110 mg/dL — ABNORMAL HIGH (ref 70–99)
HEMATOCRIT: 38 % (ref 36.0–46.0)
HEMOGLOBIN: 12.9 g/dL (ref 12.0–15.0)
Potassium: 3.4 mEq/L — ABNORMAL LOW (ref 3.7–5.3)
Sodium: 138 mEq/L (ref 137–147)
TCO2: 20 mmol/L (ref 0–100)

## 2013-11-28 LAB — CBC
HCT: 35.6 % — ABNORMAL LOW (ref 36.0–46.0)
HEMATOCRIT: 24.4 % — AB (ref 36.0–46.0)
HEMATOCRIT: 35.5 % — AB (ref 36.0–46.0)
HEMOGLOBIN: 11.9 g/dL — AB (ref 12.0–15.0)
Hemoglobin: 11.9 g/dL — ABNORMAL LOW (ref 12.0–15.0)
Hemoglobin: 8.5 g/dL — ABNORMAL LOW (ref 12.0–15.0)
MCH: 31.2 pg (ref 26.0–34.0)
MCH: 30.1 pg (ref 26.0–34.0)
MCH: 29 pg (ref 26.0–34.0)
MCHC: 33.4 g/dL (ref 30.0–36.0)
MCHC: 34.8 g/dL (ref 30.0–36.0)
MCHC: 33.5 g/dL (ref 30.0–36.0)
MCV: 93.4 fL (ref 78.0–100.0)
MCV: 86.5 fL (ref 78.0–100.0)
MCV: 86.4 fL (ref 78.0–100.0)
Platelets: 343 10*3/uL (ref 150–400)
Platelets: 126 10*3/uL — ABNORMAL LOW (ref 150–400)
Platelets: 171 10*3/uL (ref 150–400)
RBC: 3.81 MIL/uL — ABNORMAL LOW (ref 3.87–5.11)
RBC: 2.82 MIL/uL — AB (ref 3.87–5.11)
RBC: 4.11 MIL/uL (ref 3.87–5.11)
RDW: 14.1 % (ref 11.5–15.5)
RDW: 17.2 % — ABNORMAL HIGH (ref 11.5–15.5)
RDW: 17.3 % — ABNORMAL HIGH (ref 11.5–15.5)
WBC: 13.5 10*3/uL — AB (ref 4.0–10.5)
WBC: 6.6 10*3/uL (ref 4.0–10.5)
WBC: 8.7 10*3/uL (ref 4.0–10.5)

## 2013-11-28 LAB — ETHANOL

## 2013-11-28 LAB — COMPREHENSIVE METABOLIC PANEL
ALK PHOS: 60 U/L (ref 39–117)
ALT: 9 U/L (ref 0–35)
AST: 18 U/L (ref 0–37)
Albumin: 3.6 g/dL (ref 3.5–5.2)
BUN: 19 mg/dL (ref 6–23)
CALCIUM: 8 mg/dL — AB (ref 8.4–10.5)
CO2: 19 mEq/L (ref 19–32)
Chloride: 103 mEq/L (ref 96–112)
Creatinine, Ser: 1.2 mg/dL — ABNORMAL HIGH (ref 0.50–1.10)
GFR calc non Af Amer: 54 mL/min — ABNORMAL LOW (ref >90)
GFR, EST AFRICAN AMERICAN: 63 mL/min — AB (ref >90)
GLUCOSE: 113 mg/dL — AB (ref 70–99)
Potassium: 3.7 mEq/L (ref 3.7–5.3)
Sodium: 138 mEq/L (ref 137–147)
Total Bilirubin: 0.5 mg/dL (ref 0.3–1.2)
Total Protein: 6.1 g/dL (ref 6.0–8.3)

## 2013-11-28 LAB — BASIC METABOLIC PANEL
BUN: 13 mg/dL (ref 6–23)
CHLORIDE: 106 meq/L (ref 96–112)
CO2: 22 mEq/L (ref 19–32)
Calcium: 6.8 mg/dL — ABNORMAL LOW (ref 8.4–10.5)
Creatinine, Ser: 0.68 mg/dL (ref 0.50–1.10)
GFR calc Af Amer: >90 mL/min (ref >90)
Glucose, Bld: 112 mg/dL — ABNORMAL HIGH (ref 70–99)
POTASSIUM: 3.3 meq/L — AB (ref 3.7–5.3)
SODIUM: 140 meq/L (ref 137–147)

## 2013-11-28 LAB — CG4 I-STAT (LACTIC ACID): Lactic Acid, Venous: 2.04 mmol/L (ref 0.5–2.2)

## 2013-11-28 LAB — MRSA PCR SCREENING: MRSA by PCR: NEGATIVE

## 2013-11-28 LAB — PROTIME-INR
INR: 1.11 (ref 0.00–1.49)
INR: 1.69 — AB (ref 0.00–1.49)
PROTHROMBIN TIME: 14.1 s (ref 11.6–15.2)
Prothrombin Time: 19.4 seconds — ABNORMAL HIGH (ref 11.6–15.2)

## 2013-11-28 LAB — ABO/RH: ABO/RH(D): O POS

## 2013-11-28 LAB — CDS SEROLOGY

## 2013-11-28 SURGERY — LAPAROTOMY, EXPLORATORY
Anesthesia: General | Site: Chest | Laterality: Right

## 2013-11-28 MED ORDER — SODIUM BICARBONATE 8.4 % IV SOLN
INTRAVENOUS | Status: AC
  Filled 2013-11-28: qty 50

## 2013-11-28 MED ORDER — HEMOSTATIC AGENTS (NO CHARGE) OPTIME
TOPICAL | Status: DC | PRN
  Administered 2013-11-28 (×2): 1 via TOPICAL

## 2013-11-28 MED ORDER — CHLORHEXIDINE GLUCONATE 0.12 % MT SOLN
15.0000 mL | Freq: Two times a day (BID) | OROMUCOSAL | Status: DC
  Administered 2013-11-28 – 2013-12-04 (×12): 15 mL via OROMUCOSAL
  Filled 2013-11-28 (×10): qty 15

## 2013-11-28 MED ORDER — ROCURONIUM BROMIDE 50 MG/5ML IV SOLN
INTRAVENOUS | Status: AC
  Filled 2013-11-28: qty 1

## 2013-11-28 MED ORDER — NEOSTIGMINE METHYLSULFATE 10 MG/10ML IV SOLN
INTRAVENOUS | Status: DC | PRN
  Administered 2013-11-28: 4 mg via INTRAVENOUS

## 2013-11-28 MED ORDER — PROPOFOL 10 MG/ML IV BOLUS
INTRAVENOUS | Status: AC
  Filled 2013-11-28: qty 20

## 2013-11-28 MED ORDER — DIPHENHYDRAMINE HCL 12.5 MG/5ML PO ELIX
12.5000 mg | ORAL_SOLUTION | Freq: Four times a day (QID) | ORAL | Status: DC | PRN
  Filled 2013-11-28: qty 5

## 2013-11-28 MED ORDER — HYDROMORPHONE HCL PF 1 MG/ML IJ SOLN
0.2500 mg | INTRAMUSCULAR | Status: DC | PRN
  Administered 2013-11-28 (×2): 0.5 mg via INTRAVENOUS

## 2013-11-28 MED ORDER — PANTOPRAZOLE SODIUM 40 MG IV SOLR
40.0000 mg | Freq: Every day | INTRAVENOUS | Status: DC
  Administered 2013-11-28 – 2013-12-01 (×4): 40 mg via INTRAVENOUS
  Filled 2013-11-28 (×5): qty 40

## 2013-11-28 MED ORDER — SUCCINYLCHOLINE CHLORIDE 20 MG/ML IJ SOLN
INTRAMUSCULAR | Status: AC
  Filled 2013-11-28: qty 1

## 2013-11-28 MED ORDER — MIDAZOLAM HCL 5 MG/5ML IJ SOLN
INTRAMUSCULAR | Status: DC | PRN
  Administered 2013-11-28: 2 mg via INTRAVENOUS

## 2013-11-28 MED ORDER — LACTATED RINGERS IV SOLN
INTRAVENOUS | Status: DC | PRN
Start: 2013-11-28 — End: 2013-11-28
  Administered 2013-11-28: 08:00:00 via INTRAVENOUS

## 2013-11-28 MED ORDER — DIPHENHYDRAMINE HCL 50 MG/ML IJ SOLN
12.5000 mg | Freq: Four times a day (QID) | INTRAMUSCULAR | Status: DC | PRN
  Administered 2013-11-29 – 2013-11-30 (×2): 12.5 mg via INTRAVENOUS
  Filled 2013-11-28 (×4): qty 1

## 2013-11-28 MED ORDER — NALOXONE HCL 0.4 MG/ML IJ SOLN: 0.4000 mg | INTRAMUSCULAR | Status: DC | PRN

## 2013-11-28 MED ORDER — BIOTENE DRY MOUTH MT LIQD
15.0000 mL | Freq: Four times a day (QID) | OROMUCOSAL | Status: DC
  Administered 2013-11-28 – 2013-12-05 (×17): 15 mL via OROMUCOSAL

## 2013-11-28 MED ORDER — ONDANSETRON HCL 4 MG/2ML IJ SOLN
4.0000 mg | Freq: Four times a day (QID) | INTRAMUSCULAR | Status: DC | PRN
  Administered 2013-11-28 – 2013-12-04 (×6): 4 mg via INTRAVENOUS
  Filled 2013-11-28 (×6): qty 2

## 2013-11-28 MED ORDER — FENTANYL CITRATE 0.05 MG/ML IJ SOLN
INTRAMUSCULAR | Status: AC
  Filled 2013-11-28: qty 5

## 2013-11-28 MED ORDER — SUCCINYLCHOLINE CHLORIDE 20 MG/ML IJ SOLN
INTRAMUSCULAR | Status: DC | PRN
  Administered 2013-11-28: 120 mg via INTRAVENOUS

## 2013-11-28 MED ORDER — GLYCOPYRROLATE 0.2 MG/ML IJ SOLN
INTRAMUSCULAR | Status: DC | PRN
  Administered 2013-11-28: 0.6 mg via INTRAVENOUS

## 2013-11-28 MED ORDER — ARTIFICIAL TEARS OP OINT
TOPICAL_OINTMENT | OPHTHALMIC | Status: AC
  Filled 2013-11-28: qty 3.5

## 2013-11-28 MED ORDER — ROCURONIUM BROMIDE 100 MG/10ML IV SOLN
INTRAVENOUS | Status: DC | PRN
  Administered 2013-11-28: 50 mg via INTRAVENOUS

## 2013-11-28 MED ORDER — 0.9 % SODIUM CHLORIDE (POUR BTL) OPTIME
TOPICAL | Status: DC | PRN
  Administered 2013-11-28: 6000 mL

## 2013-11-28 MED ORDER — SODIUM CHLORIDE 0.9 % IJ SOLN: 9.0000 mL | INTRAMUSCULAR | Status: DC | PRN

## 2013-11-28 MED ORDER — MIDAZOLAM HCL 2 MG/2ML IJ SOLN
INTRAMUSCULAR | Status: AC
  Filled 2013-11-28: qty 2

## 2013-11-28 MED ORDER — PANTOPRAZOLE SODIUM 40 MG PO TBEC: 40.0000 mg | DELAYED_RELEASE_TABLET | Freq: Every day | ORAL | Status: DC

## 2013-11-28 MED ORDER — POVIDONE-IODINE 10 % EX OINT
TOPICAL_OINTMENT | CUTANEOUS | Status: AC
  Filled 2013-11-28: qty 28.35

## 2013-11-28 MED ORDER — SODIUM CHLORIDE 0.9 % IV SOLN
INTRAVENOUS | Status: DC | PRN
  Administered 2013-11-28: 08:00:00 via INTRAVENOUS

## 2013-11-28 MED ORDER — LACTATED RINGERS IV SOLN
INTRAVENOUS | Status: DC | PRN
  Administered 2013-11-28 (×2): via INTRAVENOUS

## 2013-11-28 MED ORDER — KCL IN DEXTROSE-NACL 20-5-0.45 MEQ/L-%-% IV SOLN
INTRAVENOUS | Status: DC
  Administered 2013-11-28 – 2013-12-01 (×7): via INTRAVENOUS
  Filled 2013-11-28 (×13): qty 1000

## 2013-11-28 MED ORDER — ETOMIDATE 2 MG/ML IV SOLN
INTRAVENOUS | Status: AC
  Filled 2013-11-28: qty 10

## 2013-11-28 MED ORDER — METHYLENE BLUE 1 % INJ SOLN
INTRAMUSCULAR | Status: DC | PRN
  Administered 2013-11-28: 100 mg via INTRAVENOUS

## 2013-11-28 MED ORDER — HYDROMORPHONE 0.3 MG/ML IV SOLN
INTRAVENOUS | Status: DC
  Administered 2013-11-28: 11:00:00 via INTRAVENOUS
  Administered 2013-11-28 (×2): 2.7 mg via INTRAVENOUS
  Administered 2013-11-29: 3 mg via INTRAVENOUS
  Administered 2013-11-29: 1.2 mg via INTRAVENOUS
  Administered 2013-11-29: 3.3 mg via INTRAVENOUS
  Administered 2013-11-29: via INTRAVENOUS
  Administered 2013-11-29: 3.9 mg via INTRAVENOUS
  Administered 2013-11-29: 1.2 mg via INTRAVENOUS
  Administered 2013-11-29: 3.9 mg via INTRAVENOUS
  Administered 2013-11-29: 0.3 mg via INTRAVENOUS
  Administered 2013-11-29: 3 mg via INTRAVENOUS
  Administered 2013-11-30: 8.1 mg via INTRAVENOUS
  Administered 2013-11-30: 22:00:00 via INTRAVENOUS
  Administered 2013-11-30: 1.8 mg via INTRAVENOUS
  Administered 2013-11-30: 1.9 mg via INTRAVENOUS
  Administered 2013-11-30: 07:00:00 via INTRAVENOUS
  Administered 2013-11-30: 3.9 mg via INTRAVENOUS
  Administered 2013-11-30 (×2): 3 mg via INTRAVENOUS
  Administered 2013-12-01: 2.4 mg via INTRAVENOUS
  Administered 2013-12-01: 4.2 mg via INTRAVENOUS
  Administered 2013-12-01: 3.6 mg via INTRAVENOUS
  Administered 2013-12-01: 08:00:00 via INTRAVENOUS
  Filled 2013-11-28 (×10): qty 25

## 2013-11-28 MED ORDER — CEFAZOLIN SODIUM-DEXTROSE 2-3 GM-% IV SOLR
INTRAVENOUS | Status: DC | PRN
  Administered 2013-11-28: 2 g via INTRAVENOUS

## 2013-11-28 MED ORDER — FENTANYL CITRATE 0.05 MG/ML IJ SOLN
INTRAMUSCULAR | Status: DC | PRN
  Administered 2013-11-28: 50 ug via INTRAVENOUS
  Administered 2013-11-28: 150 ug via INTRAVENOUS
  Administered 2013-11-28: 50 ug via INTRAVENOUS

## 2013-11-28 MED ORDER — ONDANSETRON HCL 4 MG/2ML IJ SOLN
INTRAMUSCULAR | Status: DC | PRN
  Administered 2013-11-28: 4 mg via INTRAVENOUS

## 2013-11-28 MED ORDER — HYDROMORPHONE HCL PF 1 MG/ML IJ SOLN
INTRAMUSCULAR | Status: AC
  Filled 2013-11-28: qty 1

## 2013-11-28 MED ORDER — ETOMIDATE 2 MG/ML IV SOLN
INTRAVENOUS | Status: DC | PRN
  Administered 2013-11-28: 14 mg via INTRAVENOUS

## 2013-11-28 MED ORDER — LIDOCAINE HCL (CARDIAC) 20 MG/ML IV SOLN
INTRAVENOUS | Status: DC | PRN
  Administered 2013-11-28: 50 mg via INTRAVENOUS

## 2013-11-28 MED ORDER — ARTIFICIAL TEARS OP OINT
TOPICAL_OINTMENT | OPHTHALMIC | Status: DC | PRN
  Administered 2013-11-28: 1 via OPHTHALMIC

## 2013-11-28 MED ORDER — CEFAZOLIN SODIUM 1-5 GM-% IV SOLN
1.0000 g | Freq: Four times a day (QID) | INTRAVENOUS | Status: DC
  Administered 2013-11-28 – 2013-12-01 (×12): 1 g via INTRAVENOUS
  Filled 2013-11-28 (×15): qty 50

## 2013-11-28 MED ORDER — HYDROMORPHONE 0.3 MG/ML IV SOLN
INTRAVENOUS | Status: AC
  Filled 2013-11-28: qty 25

## 2013-11-28 MED ORDER — POVIDONE-IODINE 10 % EX OINT
TOPICAL_OINTMENT | CUTANEOUS | Status: DC | PRN
  Administered 2013-11-28: 1 via TOPICAL

## 2013-11-28 MED ORDER — SODIUM BICARBONATE 8.4 % IV SOLN
INTRAVENOUS | Status: DC | PRN
  Administered 2013-11-28 (×2): 50 meq via INTRAVENOUS

## 2013-11-28 SURGICAL SUPPLY — 76 items
BANDAGE GAUZE ELAST BULKY 4 IN (GAUZE/BANDAGES/DRESSINGS) ×2 IMPLANT
BLADE SURG ROTATE 9660 (MISCELLANEOUS) IMPLANT
BRR ADH 5X3 SEPRAFILM 6 SHT (MISCELLANEOUS)
CANISTER SUCTION 2500CC (MISCELLANEOUS) ×5 IMPLANT
CHLORAPREP W/TINT 26ML (MISCELLANEOUS) ×5 IMPLANT
COVER MAYO STAND STRL (DRAPES) IMPLANT
COVER SURGICAL LIGHT HANDLE (MISCELLANEOUS) ×5 IMPLANT
DRAIN CHANNEL 19F RND (DRAIN) ×2 IMPLANT
DRAPE LAPAROSCOPIC ABDOMINAL (DRAPES) ×5 IMPLANT
DRAPE PROXIMA HALF (DRAPES) IMPLANT
DRAPE UTILITY 15X26 W/TAPE STR (DRAPE) ×10 IMPLANT
DRAPE WARM FLUID 44X44 (DRAPE) ×5 IMPLANT
DRSG OPSITE POSTOP 4X10 (GAUZE/BANDAGES/DRESSINGS) IMPLANT
DRSG OPSITE POSTOP 4X8 (GAUZE/BANDAGES/DRESSINGS) IMPLANT
ELECT BLADE 4.0 EZ CLEAN MEGAD (MISCELLANEOUS) ×5
ELECT BLADE 6.5 EXT (BLADE) IMPLANT
ELECT CAUTERY BLADE 6.4 (BLADE) ×10 IMPLANT
ELECT REM PT RETURN 9FT ADLT (ELECTROSURGICAL) ×5
ELECTRODE BLDE 4.0 EZ CLN MEGD (MISCELLANEOUS) IMPLANT
ELECTRODE REM PT RTRN 9FT ADLT (ELECTROSURGICAL) ×3 IMPLANT
GAUZE XEROFORM 1X8 LF (GAUZE/BANDAGES/DRESSINGS) ×2 IMPLANT
GLOVE BIO SURGEON STRL SZ7.5 (GLOVE) ×2 IMPLANT
GLOVE BIO SURGEON STRL SZ8 (GLOVE) ×2 IMPLANT
GLOVE BIOGEL PI IND STRL 6.5 (GLOVE) IMPLANT
GLOVE BIOGEL PI IND STRL 7.0 (GLOVE) IMPLANT
GLOVE BIOGEL PI IND STRL 8 (GLOVE) ×3 IMPLANT
GLOVE BIOGEL PI INDICATOR 6.5 (GLOVE) ×2
GLOVE BIOGEL PI INDICATOR 7.0 (GLOVE) ×4
GLOVE BIOGEL PI INDICATOR 8 (GLOVE) ×6
GLOVE ECLIPSE 6.0 STRL STRAW (GLOVE) ×2 IMPLANT
GLOVE ECLIPSE 7.5 STRL STRAW (GLOVE) ×5 IMPLANT
GOWN STRL REUS W/ TWL LRG LVL3 (GOWN DISPOSABLE) ×9 IMPLANT
GOWN STRL REUS W/ TWL XL LVL3 (GOWN DISPOSABLE) IMPLANT
GOWN STRL REUS W/TWL LRG LVL3 (GOWN DISPOSABLE) ×15
GOWN STRL REUS W/TWL XL LVL3 (GOWN DISPOSABLE) ×10
HEMOSTAT SNOW SURGICEL 2X4 (HEMOSTASIS) ×2 IMPLANT
HEMOSTAT SURGICEL 2X14 (HEMOSTASIS) ×2 IMPLANT
KIT BASIN OR (CUSTOM PROCEDURE TRAY) ×5 IMPLANT
KIT ROOM TURNOVER OR (KITS) ×5 IMPLANT
LIGASURE IMPACT 36 18CM CVD LR (INSTRUMENTS) ×2 IMPLANT
NS IRRIG 1000ML POUR BTL (IV SOLUTION) ×10 IMPLANT
PACK GENERAL/GYN (CUSTOM PROCEDURE TRAY) ×5 IMPLANT
PAD ABD 8X10 STRL (GAUZE/BANDAGES/DRESSINGS) ×2 IMPLANT
PAD ARMBOARD 7.5X6 YLW CONV (MISCELLANEOUS) ×5 IMPLANT
PENCIL BUTTON HOLSTER BLD 10FT (ELECTRODE) ×2 IMPLANT
PENCIL FOOT CONTROL (ELECTRODE) ×2 IMPLANT
RELOAD PROXIMATE 75MM BLUE (ENDOMECHANICALS) ×25 IMPLANT
RELOAD PROXIMATE TA60MM BLUE (ENDOMECHANICALS) ×5 IMPLANT
RELOAD STAPLE 60 BLU REG PROX (ENDOMECHANICALS) IMPLANT
RELOAD STAPLE 75 3.8 BLU REG (ENDOMECHANICALS) IMPLANT
SEPRAFILM PROCEDURAL PACK 3X5 (MISCELLANEOUS) IMPLANT
SPECIMEN JAR LARGE (MISCELLANEOUS) IMPLANT
SPONGE GAUZE 4X4 12PLY (GAUZE/BANDAGES/DRESSINGS) ×2 IMPLANT
SPONGE GAUZE 4X4 12PLY STER LF (GAUZE/BANDAGES/DRESSINGS) ×2 IMPLANT
SPONGE LAP 18X18 X RAY DECT (DISPOSABLE) IMPLANT
STAPLER GUN LINEAR PROX 60 (STAPLE) ×2 IMPLANT
STAPLER PROXIMATE 75MM BLUE (STAPLE) ×4 IMPLANT
STAPLER VISISTAT 35W (STAPLE) ×5 IMPLANT
SUCTION POOLE TIP (SUCTIONS) ×5 IMPLANT
SUT NOVA 1 T20/GS 25DT (SUTURE) IMPLANT
SUT PDS AB 1 TP1 96 (SUTURE) ×10 IMPLANT
SUT SILK 0 FSL (SUTURE) ×2 IMPLANT
SUT SILK 2 0 SH CR/8 (SUTURE) ×9 IMPLANT
SUT SILK 2 0 TIES 10X30 (SUTURE) ×5 IMPLANT
SUT SILK 3 0 SH CR/8 (SUTURE) ×9 IMPLANT
SUT SILK 3 0 TIES 10X30 (SUTURE) ×5 IMPLANT
SUT VIC AB 2-0 SH 18 (SUTURE) ×2 IMPLANT
SUT VIC AB 3-0 SH 18 (SUTURE) ×2 IMPLANT
SYR BULB IRRIGATION 50ML (SYRINGE) ×2 IMPLANT
TAPE CLOTH SURG 6X10 WHT LF (GAUZE/BANDAGES/DRESSINGS) ×6 IMPLANT
TOWEL OR 17X26 10 PK STRL BLUE (TOWEL DISPOSABLE) ×5 IMPLANT
TRAY CHEST TUBE INSERTION (SET/KITS/TRAYS/PACK) ×2 IMPLANT
TRAY FOLEY CATH 16FRSI W/METER (SET/KITS/TRAYS/PACK) IMPLANT
TUBE CONNECTING 12'X1/4 (SUCTIONS)
TUBE CONNECTING 12X1/4 (SUCTIONS) IMPLANT
YANKAUER SUCT BULB TIP NO VENT (SUCTIONS) IMPLANT

## 2013-11-28 NOTE — OR Nursing (Addendum)
At [phone] it was noted by Karren Cobble that patient had arrived to OR 7 with right eyebrow piercing partially in place but that the piercing was not found in place at end of case. Piercing found, bageed and tagged with patient sticker and placed with patient consent.

## 2013-11-28 NOTE — OR Nursing (Addendum)
Karren Cobble called report to receiving nurse for 3MW @0840 . Metal object removed from abdomen by Dr. Hulen Skains. Documented and delivered to [facility] per policy. Abdomen closure @0903 . End for procedure @0908 .wound exploration of buttock from 0910 to 0924. Chest tube insertion @0927 , end @0929 . Abdominal xray for incorrect count cleared by Dr Jeralyn Ruths @0958 .
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
## 2013-11-28 NOTE — ED Notes (Signed)
1st Unit Emergency blood infusing into L hand unit [id number].

## 2013-11-28 NOTE — Progress Notes (Signed)
UR completed.  Jood Retana, RN BSN [facility] CCM Trauma/Neuro ICU Case Manager [phone]  

## 2013-11-28 NOTE — Addendum Note (Signed)
Addendum created 11/28/13 1515 by Scheryl Darter, CRNA   Modules edited: Anesthesia Medication Administration
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
## 2013-11-28 NOTE — ED Notes (Signed)
2nd Unit Emergency Blood Infusing [place] Unit # [phone].

## 2013-11-28 NOTE — Anesthesia Preprocedure Evaluation (Addendum)
Anesthesia Evaluation  Patient identified by MRN, date of birth, ID band Patient unresponsive  General Assessment Comment:Brought emergently to the OR stat. CE  Reviewed: Unable to perform ROS - Chart review only  Airway Mallampati: II      Dental   Pulmonary          Cardiovascular     Neuro/Psych    GI/Hepatic   Endo/Other    Renal/GU      Musculoskeletal   Abdominal   Peds  Hematology   Anesthesia Other Findings   Reproductive/Obstetrics                          Anesthesia Physical Anesthesia Plan  ASA: IV and emergent  Anesthesia Plan: General ETT   Post-op Pain Management:    Induction: Intravenous, Rapid sequence and Cricoid pressure planned  Airway Management Planned: Oral ETT  Additional Equipment:   Intra-op Plan:   Post-operative Plan: Possible Post-op intubation/ventilation  Informed Consent: I have reviewed the patients History and Physical, chart, labs and discussed the procedure including the risks, benefits and alternatives for the proposed anesthesia with the patient or authorized representative who has indicated his/her understanding and acceptance.   Dental advisory given  Plan Discussed with: CRNA, Anesthesiologist and Surgeon  Anesthesia Plan Comments:        Anesthesia Quick Evaluation
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
## 2013-11-28 NOTE — Anesthesia Postprocedure Evaluation (Signed)
  Anesthesia Post-op Note  Patient: Danielle Swanson  Procedure(s) Performed: Procedure(s): EXPLORATORY LAPAROTOMY (N/A) SMALL BOWEL RESECTION (N/A) CHEST TUBE INSERTION (Right) WOUND EXPLORATION (Right)  Patient Location: PACU  Anesthesia Type:General  Level of Consciousness: awake  Airway and Oxygen Therapy: Patient Spontanous Breathing  Post-op Pain: mild  Post-op Assessment: Post-op Vital signs reviewed  Post-op Vital Signs: Reviewed  Last Vitals:  Filed Vitals:   11/28/13 1400  BP: 125/96  Pulse: 107  Temp:   Resp: 17    Complications: No apparent anesthesia complications
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
## 2013-11-28 NOTE — Progress Notes (Signed)
Patient refusing PCA encouragement. Verbally threatening towards staff. Danielle Swanson

## 2013-11-28 NOTE — Anesthesia Procedure Notes (Signed)
Performed by: Scheryl Darter

## 2013-11-28 NOTE — Progress Notes (Signed)
Patient refused CT Scan. 
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
## 2013-11-28 NOTE — Transfer of Care (Signed)
Immediate Anesthesia Transfer of Care Note  Patient: Danielle Swanson  Procedure(s) Performed: Procedure(s): EXPLORATORY LAPAROTOMY (N/A) SMALL BOWEL RESECTION (N/A) CHEST TUBE INSERTION (Right) WOUND EXPLORATION (Right)  Patient Location: PACU  Anesthesia Type:General  Level of Consciousness: awake, alert , oriented and sedated  Airway & Oxygen Therapy: Patient Spontanous Breathing and Patient connected to face mask oxygen  Post-op Assessment: Report given to PACU RN, Post -op Vital signs reviewed and stable and Patient moving all extremities  Post vital signs: Reviewed and stable  Complications: No apparent anesthesia complications

## 2013-11-28 NOTE — Progress Notes (Signed)
Patient refused X-Ray of pelvis. MD Notified

## 2013-11-29 ENCOUNTER — Encounter (HOSPITAL_COMMUNITY): Payer: Self-pay | Admitting: *Deleted

## 2013-11-29 ENCOUNTER — Inpatient Hospital Stay (HOSPITAL_COMMUNITY): Payer: Medicaid Other

## 2013-11-29 DIAGNOSIS — S329XXA Fracture of unspecified parts of lumbosacral spine and pelvis, initial encounter for closed fracture: Secondary | ICD-10-CM

## 2013-11-29 DIAGNOSIS — R45851 Suicidal ideations: Secondary | ICD-10-CM

## 2013-11-29 DIAGNOSIS — N179 Acute kidney failure, unspecified: Secondary | ICD-10-CM

## 2013-11-29 DIAGNOSIS — D62 Acute posthemorrhagic anemia: Secondary | ICD-10-CM

## 2013-11-29 LAB — PREPARE FRESH FROZEN PLASMA
UNIT DIVISION: 0
UNIT DIVISION: 0
Unit division: 0
Unit division: 0
Unit division: 0
Unit division: 0

## 2013-11-29 LAB — BLOOD PRODUCT ORDER (VERBAL) VERIFICATION

## 2013-11-29 LAB — CBC
HEMATOCRIT: 41.6 % (ref 36.0–46.0)
HEMOGLOBIN: 14.1 g/dL (ref 12.0–15.0)
MCH: 29.2 pg (ref 26.0–34.0)
MCHC: 33.9 g/dL (ref 30.0–36.0)
MCV: 86.1 fL (ref 78.0–100.0)
PLATELETS: 193 10*3/uL (ref 150–400)
RBC: 4.83 MIL/uL (ref 3.87–5.11)
RDW: 18 % — ABNORMAL HIGH (ref 11.5–15.5)
WBC: 14.4 10*3/uL — AB (ref 4.0–10.5)

## 2013-11-29 LAB — BASIC METABOLIC PANEL
BUN: 5 mg/dL — ABNORMAL LOW (ref 6–23)
CALCIUM: 7.2 mg/dL — AB (ref 8.4–10.5)
CHLORIDE: 104 meq/L (ref 96–112)
CO2: 20 meq/L (ref 19–32)
Creatinine, Ser: 0.54 mg/dL (ref 0.50–1.10)
GFR calc Af Amer: >90 mL/min (ref >90)
GFR calc non Af Amer: >90 mL/min (ref >90)
Glucose, Bld: 122 mg/dL — ABNORMAL HIGH (ref 70–99)
Potassium: 3.7 mEq/L (ref 3.7–5.3)
Sodium: 136 mEq/L — ABNORMAL LOW (ref 137–147)

## 2013-11-29 MED ORDER — NICOTINE 14 MG/24HR TD PT24
14.0000 mg | MEDICATED_PATCH | TRANSDERMAL | Status: DC
  Administered 2013-11-29 – 2013-12-04 (×6): 14 mg via TRANSDERMAL
  Filled 2013-11-29 (×11): qty 1

## 2013-11-29 MED ORDER — LORAZEPAM BOLUS VIA INFUSION
0.5000 mg | Freq: Three times a day (TID) | INTRAVENOUS | Status: DC | PRN
  Filled 2013-11-29: qty 1

## 2013-11-29 MED ORDER — ENOXAPARIN SODIUM 40 MG/0.4ML ~~LOC~~ SOLN
40.0000 mg | SUBCUTANEOUS | Status: DC
  Administered 2013-11-29 – 2013-12-04 (×6): 40 mg via SUBCUTANEOUS
  Filled 2013-11-29 (×7): qty 0.4

## 2013-11-29 MED ORDER — LORAZEPAM 2 MG/ML IJ SOLN
0.5000 mg | Freq: Three times a day (TID) | INTRAMUSCULAR | Status: DC | PRN
  Administered 2013-11-29 – 2013-12-01 (×7): 1 mg via INTRAVENOUS
  Filled 2013-11-29 (×7): qty 1

## 2013-11-29 MED ORDER — IOHEXOL 300 MG/ML  SOLN
100.0000 mL | Freq: Once | INTRAMUSCULAR | Status: AC | PRN
  Administered 2013-11-29: 100 mL via INTRAVENOUS
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
## 2013-11-29 NOTE — Progress Notes (Signed)
Agree with above 
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
## 2013-11-30 ENCOUNTER — Inpatient Hospital Stay (HOSPITAL_COMMUNITY): Payer: Medicaid Other

## 2013-11-30 LAB — TYPE AND SCREEN
ABO/RH(D): O POS
Antibody Screen: NEGATIVE
UNIT DIVISION: 0
UNIT DIVISION: 0
UNIT DIVISION: 0
UNIT DIVISION: 0
UNIT DIVISION: 0
Unit division: 0
Unit division: 0
Unit division: 0
Unit division: 0
Unit division: 0

## 2013-11-30 MED ORDER — LACTATED RINGERS IV BOLUS (SEPSIS): 1000.0000 mL | Freq: Three times a day (TID) | INTRAVENOUS | Status: DC | PRN

## 2013-11-30 MED ORDER — ACETAMINOPHEN 650 MG RE SUPP: 650.0000 mg | Freq: Four times a day (QID) | RECTAL | Status: DC | PRN

## 2013-11-30 MED ORDER — METOPROLOL TARTRATE 1 MG/ML IV SOLN: 5.0000 mg | Freq: Four times a day (QID) | INTRAVENOUS | Status: DC | PRN

## 2013-11-30 MED ORDER — LIP MEDEX EX OINT
1.0000 "application " | TOPICAL_OINTMENT | Freq: Two times a day (BID) | CUTANEOUS | Status: DC
  Administered 2013-11-30 – 2013-12-04 (×9): 1 via TOPICAL
  Filled 2013-11-30: qty 7

## 2013-11-30 MED ORDER — PROMETHAZINE HCL 25 MG/ML IJ SOLN
6.2500 mg | Freq: Four times a day (QID) | INTRAMUSCULAR | Status: DC | PRN
  Administered 2013-12-01: 12.5 mg via INTRAVENOUS
  Administered 2013-12-01: 25 mg via INTRAVENOUS
  Filled 2013-11-30 (×2): qty 1

## 2013-11-30 MED ORDER — MENTHOL 3 MG MT LOZG
1.0000 | LOZENGE | OROMUCOSAL | Status: DC | PRN
  Administered 2013-11-30: 3 mg via ORAL
  Filled 2013-11-30: qty 9

## 2013-11-30 MED ORDER — MAGIC MOUTHWASH
15.0000 mL | Freq: Four times a day (QID) | ORAL | Status: DC | PRN
Start: 2013-11-30 — End: 2013-12-02

## 2013-11-30 MED ORDER — ALUM & MAG HYDROXIDE-SIMETH 200-200-20 MG/5ML PO SUSP
30.0000 mL | Freq: Four times a day (QID) | ORAL | Status: DC | PRN
  Administered 2013-12-02: 30 mL via ORAL
  Filled 2013-11-30: qty 30

## 2013-11-30 MED ORDER — BISACODYL 10 MG RE SUPP
10.0000 mg | Freq: Two times a day (BID) | RECTAL | Status: DC | PRN
Start: 2013-11-30 — End: 2013-12-05

## 2013-11-30 MED ORDER — PHENOL 1.4 % MT LIQD: 1.0000 | OROMUCOSAL | Status: DC | PRN

## 2013-11-30 NOTE — Progress Notes (Signed)
Agree w d/c chest tube NGT until ileus resolves Follow gluteal closure

## 2013-11-30 NOTE — Progress Notes (Signed)
Pt c/o constant abdominal pain.  TC to Dr. Donne Hazel.  Stated the PCA Dilaudid could be changed to full dose Morphine, but it would not be as effective.  Explained to pt, she stated she would wait to see if the Dilaudid "does better".  Will continue to monitor.

## 2013-11-30 NOTE — Progress Notes (Signed)
Pt finally sleeping. Asked NT not to wake pt to get this set of vitals due to pt resting comfortably.
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
## 2013-11-30 NOTE — Progress Notes (Signed)
Pt awake, restless.  Not c/o pain at this time.  Has had some nausea, no vomiting. Will continue to monitor.

## 2013-11-30 NOTE — Progress Notes (Addendum)
Pt refused the dressing change "for now".  States too much pain, it will make it worse.  "I was in pain the first night, and last night".  Pt asking for her home pain medications to be given.  Explained she is NPO and she is getting the pain medication by IV instead.  Pt voiced understanding, but says she guesses she will just be expected to stay in pain.  The full dose of Morphine was offered (see1 AM note) pt declined.

## 2013-12-01 ENCOUNTER — Encounter (HOSPITAL_COMMUNITY): Payer: Self-pay | Admitting: General Surgery

## 2013-12-01 ENCOUNTER — Inpatient Hospital Stay (HOSPITAL_COMMUNITY): Payer: Medicaid Other

## 2013-12-01 DIAGNOSIS — M797 Fibromyalgia: Secondary | ICD-10-CM | POA: Insufficient documentation

## 2013-12-01 DIAGNOSIS — M1990 Unspecified osteoarthritis, unspecified site: Secondary | ICD-10-CM | POA: Insufficient documentation

## 2013-12-01 DIAGNOSIS — T50905A Adverse effect of unspecified drugs, medicaments and biological substances, initial encounter: Secondary | ICD-10-CM

## 2013-12-01 DIAGNOSIS — F4310 Post-traumatic stress disorder, unspecified: Secondary | ICD-10-CM

## 2013-12-01 DIAGNOSIS — Z789 Other specified health status: Secondary | ICD-10-CM | POA: Diagnosis present

## 2013-12-01 DIAGNOSIS — J95811 Postprocedural pneumothorax: Secondary | ICD-10-CM | POA: Diagnosis not present

## 2013-12-01 DIAGNOSIS — F1994 Other psychoactive substance use, unspecified with psychoactive substance-induced mood disorder: Secondary | ICD-10-CM

## 2013-12-01 DIAGNOSIS — N179 Acute kidney failure, unspecified: Secondary | ICD-10-CM | POA: Diagnosis not present

## 2013-12-01 DIAGNOSIS — D62 Acute posthemorrhagic anemia: Secondary | ICD-10-CM | POA: Diagnosis not present

## 2013-12-01 DIAGNOSIS — S32309A Unspecified fracture of unspecified ilium, initial encounter for closed fracture: Secondary | ICD-10-CM | POA: Diagnosis present

## 2013-12-01 DIAGNOSIS — F319 Bipolar disorder, unspecified: Secondary | ICD-10-CM

## 2013-12-01 DIAGNOSIS — S36409A Unspecified injury of unspecified part of small intestine, initial encounter: Secondary | ICD-10-CM | POA: Diagnosis present

## 2013-12-01 LAB — CBC
HCT: 36.6 % (ref 36.0–46.0)
Hemoglobin: 12.1 g/dL (ref 12.0–15.0)
MCH: 29.2 pg (ref 26.0–34.0)
MCHC: 33.1 g/dL (ref 30.0–36.0)
MCV: 88.2 fL (ref 78.0–100.0)
PLATELETS: 221 10*3/uL (ref 150–400)
RBC: 4.15 MIL/uL (ref 3.87–5.11)
RDW: 17.3 % — AB (ref 11.5–15.5)
WBC: 13.3 10*3/uL — AB (ref 4.0–10.5)

## 2013-12-01 MED ORDER — DIPHENHYDRAMINE HCL 25 MG PO CAPS
25.0000 mg | ORAL_CAPSULE | Freq: Four times a day (QID) | ORAL | Status: DC | PRN
  Administered 2013-12-01 (×2): 25 mg via ORAL
  Filled 2013-12-01 (×2): qty 1

## 2013-12-01 MED ORDER — LISINOPRIL 10 MG PO TABS
10.0000 mg | ORAL_TABLET | Freq: Every day | ORAL | Status: DC
  Administered 2013-12-01 – 2013-12-05 (×5): 10 mg via ORAL
  Filled 2013-12-01 (×6): qty 1

## 2013-12-01 MED ORDER — OXYCODONE HCL 5 MG PO TABS
10.0000 mg | ORAL_TABLET | ORAL | Status: DC | PRN
  Administered 2013-12-01 – 2013-12-05 (×17): 20 mg via ORAL
  Filled 2013-12-01 (×17): qty 4

## 2013-12-01 MED ORDER — ALPRAZOLAM 0.5 MG PO TABS
1.0000 mg | ORAL_TABLET | Freq: Three times a day (TID) | ORAL | Status: DC
  Administered 2013-12-01 – 2013-12-05 (×12): 1 mg via ORAL
  Filled 2013-12-01 (×12): qty 2

## 2013-12-01 MED ORDER — IPRATROPIUM-ALBUTEROL 20-100 MCG/ACT IN AERS: 2.0000 | INHALATION_SPRAY | Freq: Two times a day (BID) | RESPIRATORY_TRACT | Status: DC | PRN

## 2013-12-01 MED ORDER — ESTRADIOL 1 MG PO TABS
0.5000 mg | ORAL_TABLET | Freq: Two times a day (BID) | ORAL | Status: DC
  Administered 2013-12-01 – 2013-12-04 (×8): 0.5 mg via ORAL
  Filled 2013-12-01 (×11): qty 0.5

## 2013-12-01 MED ORDER — HYDROMORPHONE HCL PF 1 MG/ML IJ SOLN
1.0000 mg | INTRAMUSCULAR | Status: DC | PRN
  Administered 2013-12-01 – 2013-12-02 (×3): 1 mg via INTRAVENOUS
  Filled 2013-12-01 (×3): qty 1

## 2013-12-01 MED ORDER — GABAPENTIN 300 MG PO CAPS
300.0000 mg | ORAL_CAPSULE | Freq: Three times a day (TID) | ORAL | Status: DC
  Administered 2013-12-01 – 2013-12-05 (×12): 300 mg via ORAL
  Filled 2013-12-01 (×17): qty 1

## 2013-12-01 MED ORDER — HYDROMORPHONE HCL PF 1 MG/ML IJ SOLN
1.0000 mg | Freq: Once | INTRAMUSCULAR | Status: AC
  Administered 2013-12-01: 1 mg via INTRAVENOUS
  Filled 2013-12-01: qty 1

## 2013-12-01 MED ORDER — IPRATROPIUM-ALBUTEROL 0.5-2.5 (3) MG/3ML IN SOLN: 3.0000 mL | Freq: Two times a day (BID) | RESPIRATORY_TRACT | Status: DC | PRN

## 2013-12-01 NOTE — Progress Notes (Signed)
Passed some more gas. Will check this PM and see if we can remove NGT. It had high output but it has been clamped. Patient examined and I agree with the assessment and plan  Georganna Skeans, MD, MPH, FACS Trauma: [phone] General Surgery: [phone]  12/01/2013 11:37 AM
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
## 2013-12-01 NOTE — Progress Notes (Signed)
PT Cancellation Note  Patient Details Name: Danielle Swanson MRN: [id number] DOB: 15-Apr-1971   Cancelled Treatment:    Reason Eval/Treat Not Completed: Patient declined x2 stating getting NG out this am and heard bad family news.  This p.m. Painful and weak.  Will try back in am.     [name] 12/01/2013, 4:35 PM
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
## 2013-12-01 NOTE — Consult Note (Signed)
WOC wound consult note Reason for Consult: placement of initial NPWT VAC to midline abdominal wound Wound type: surgical  Measurement: 19.5cm x 4.0cm x 2.5cm  Wound bed: subcutaneous, clean, pink Drainage (amount, consistency, odor) minimal, serosanguinous on dressing. Periwound: intact Dressing procedure/placement/frequency: 1pc of black granufoam cut to fit and placed in the patients wound, drape and seal at 157mmHG. Pt tolerated well on PCA, used during dressing change.   [place] nurse will remain available to assist with VAC as needed.   Thanks  Diahann Guajardo [place], [place] [phone])
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
## 2013-12-01 NOTE — Progress Notes (Signed)
Pt did not want to use the Kpad for her abdominal area, pain has decreased overnight.  Pt used ice therapy for abdomen and Kpad for her chronic back pain as needed.

## 2013-12-02 DIAGNOSIS — F319 Bipolar disorder, unspecified: Secondary | ICD-10-CM | POA: Insufficient documentation

## 2013-12-02 DIAGNOSIS — F4310 Post-traumatic stress disorder, unspecified: Secondary | ICD-10-CM | POA: Diagnosis present

## 2013-12-02 DIAGNOSIS — I10 Essential (primary) hypertension: Secondary | ICD-10-CM | POA: Insufficient documentation

## 2013-12-02 MED ORDER — SIMETHICONE 80 MG PO CHEW
80.0000 mg | CHEWABLE_TABLET | Freq: Four times a day (QID) | ORAL | Status: DC | PRN
  Administered 2013-12-02 – 2013-12-04 (×4): 80 mg via ORAL
  Filled 2013-12-02 (×4): qty 1

## 2013-12-02 MED ORDER — MORPHINE SULFATE ER 15 MG PO TBCR
15.0000 mg | EXTENDED_RELEASE_TABLET | Freq: Two times a day (BID) | ORAL | Status: DC
  Administered 2013-12-02 – 2013-12-05 (×7): 15 mg via ORAL
  Filled 2013-12-02 (×7): qty 1

## 2013-12-02 MED ORDER — CALCIUM CARBONATE ANTACID 500 MG PO CHEW
400.0000 mg | CHEWABLE_TABLET | Freq: Three times a day (TID) | ORAL | Status: DC | PRN
  Administered 2013-12-02: 400 mg via ORAL
  Filled 2013-12-02: qty 1

## 2013-12-02 MED ORDER — HYDROMORPHONE HCL PF 1 MG/ML IJ SOLN
1.0000 mg | INTRAMUSCULAR | Status: DC | PRN
  Administered 2013-12-02 – 2013-12-05 (×11): 1 mg via INTRAVENOUS
  Filled 2013-12-02 (×11): qty 1

## 2013-12-02 MED ORDER — BACITRACIN ZINC 500 UNIT/GM EX OINT
TOPICAL_OINTMENT | Freq: Two times a day (BID) | CUTANEOUS | Status: DC
  Administered 2013-12-02 – 2013-12-03 (×3): 15.5556 via TOPICAL
  Administered 2013-12-03 – 2013-12-04 (×2): via TOPICAL
  Administered 2013-12-04: 15.5556 via TOPICAL
  Filled 2013-12-02: qty 28.35
  Filled 2013-12-02: qty 15

## 2013-12-02 MED ORDER — QUETIAPINE FUMARATE 50 MG PO TABS
50.0000 mg | ORAL_TABLET | Freq: Every day | ORAL | Status: DC
  Administered 2013-12-02: 50 mg via ORAL
  Filled 2013-12-02 (×2): qty 1

## 2013-12-02 NOTE — Progress Notes (Signed)
Requests Tums. Passing a lot of gas. Trying fulls. Patient examined and I agree with the assessment and plan  Georganna Skeans, MD, MPH, FACS Trauma: [phone] General Surgery: [phone]  12/02/2013 10:09 AM
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
## 2013-12-03 MED ORDER — DOCUSATE SODIUM 100 MG PO CAPS
100.0000 mg | ORAL_CAPSULE | Freq: Two times a day (BID) | ORAL | Status: DC
  Administered 2013-12-03 – 2013-12-04 (×2): 100 mg via ORAL
  Filled 2013-12-03 (×2): qty 1

## 2013-12-03 MED ORDER — FLEET ENEMA 7-19 GM/118ML RE ENEM
1.0000 | ENEMA | Freq: Once | RECTAL | Status: AC
  Administered 2013-12-03: 1 via RECTAL
  Filled 2013-12-03: qty 1

## 2013-12-03 MED ORDER — QUETIAPINE FUMARATE 25 MG PO TABS
25.0000 mg | ORAL_TABLET | Freq: Every day | ORAL | Status: DC
  Administered 2013-12-03 – 2013-12-04 (×2): 25 mg via ORAL
  Filled 2013-12-03 (×3): qty 1

## 2013-12-03 MED ORDER — POLYETHYLENE GLYCOL 3350 17 G PO PACK
17.0000 g | PACK | Freq: Every day | ORAL | Status: DC
  Administered 2013-12-03 – 2013-12-04 (×2): 17 g via ORAL
  Filled 2013-12-03 (×3): qty 1

## 2013-12-03 NOTE — Progress Notes (Signed)
Patient doing well.  Had bowel movement with enema.  Excellent bowel sounds.  VAC in place.  Should be able to be discharged soon.  This patient has been seen and I agree with the findings and treatment plan.  Kathryne Eriksson. Dahlia Bailiff, MD, [place] [phone] (pager) [phone] (direct pager) Trauma Surgeon
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
## 2013-12-04 LAB — CBC
HEMATOCRIT: 32.9 % — AB (ref 36.0–46.0)
HEMOGLOBIN: 11 g/dL — AB (ref 12.0–15.0)
MCH: 29.3 pg (ref 26.0–34.0)
MCHC: 33.4 g/dL (ref 30.0–36.0)
MCV: 87.7 fL (ref 78.0–100.0)
PLATELETS: 375 10*3/uL (ref 150–400)
RBC: 3.75 MIL/uL — AB (ref 3.87–5.11)
RDW: 17.2 % — ABNORMAL HIGH (ref 11.5–15.5)
WBC: 9.3 10*3/uL (ref 4.0–10.5)
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
## 2013-12-04 NOTE — Progress Notes (Signed)
Still some distention and C/O nausea. She had several BMs after the enema. Active BS. Encouraged ambulation. I suspect she will improve and be ready to D/C tomorrow. The location of her dispo is likely affecting how she feels. Patient examined and I agree with the assessment and plan  Georganna Skeans, MD, MPH, FACS Trauma: [phone] General Surgery: [phone]  12/04/2013 10:15 AM
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
## 2013-12-05 ENCOUNTER — Encounter (HOSPITAL_COMMUNITY): Payer: Self-pay | Admitting: Emergency Medicine

## 2013-12-05 DIAGNOSIS — R571 Hypovolemic shock: Secondary | ICD-10-CM | POA: Diagnosis present

## 2013-12-05 MED ORDER — QUETIAPINE FUMARATE 25 MG PO TABS: 25.0000 mg | ORAL_TABLET | Freq: Every day | ORAL | Status: AC

## 2013-12-05 MED ORDER — MORPHINE SULFATE ER 15 MG PO TBCR: EXTENDED_RELEASE_TABLET | ORAL | Status: DC

## 2013-12-05 MED ORDER — OXYCODONE-ACETAMINOPHEN 10-325 MG PO TABS: 1.0000 | ORAL_TABLET | ORAL | Status: AC | PRN

## 2013-12-05 MED ORDER — QUETIAPINE FUMARATE 25 MG PO TABS: 25.0000 mg | ORAL_TABLET | Freq: Every day | ORAL | Status: DC
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
## 2013-12-05 NOTE — Discharge Summary (Signed)
Discharge paperwork given to patient and reviewed. Patient discharged to custody of [place].

## 2013-12-05 NOTE — Discharge Summary (Signed)
Danielle Hechavarria, MD, MPH, FACS Trauma: [phone] General Surgery: [phone]  

## 2013-12-05 NOTE — Progress Notes (Signed)
Agree Maryhelen Lindler, MD, MPH, FACS Trauma: [phone] General Surgery: [phone]  
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
## 2013-12-05 NOTE — Discharge Instructions (Signed)
No lifting more than 5 pounds for 6 weeks.  No driving while taking MS Contin or oxycodone.  Wash wounds daily in shower with soap and water. Do not soak. Apply antibiotic ointment (e.g. Neosporin) to the right buttock wound twice daily and as needed to keep moist. Cover with dry dressing.

## 2013-12-17 ENCOUNTER — Telehealth (HOSPITAL_COMMUNITY): Payer: Self-pay

## 2013-12-17 NOTE — Telephone Encounter (Signed)
Scheduled appt for 6/3 at 2:30

## 2013-12-18 ENCOUNTER — Telehealth (HOSPITAL_COMMUNITY): Payer: Self-pay

## 2013-12-19 ENCOUNTER — Emergency Department (HOSPITAL_COMMUNITY)
Admission: EM | Admit: 2013-12-19 | Discharge: 2013-12-20 | Disposition: A | Discharge status: Home / Self Care | Payer: Medicaid Other | Attending: Emergency Medicine | Admitting: Emergency Medicine

## 2013-12-19 ENCOUNTER — Emergency Department (HOSPITAL_COMMUNITY): Payer: Medicaid Other

## 2013-12-19 ENCOUNTER — Encounter (HOSPITAL_COMMUNITY): Payer: Self-pay | Admitting: Emergency Medicine

## 2013-12-19 DIAGNOSIS — F17200 Nicotine dependence, unspecified, uncomplicated: Secondary | ICD-10-CM | POA: Insufficient documentation

## 2013-12-19 DIAGNOSIS — I252 Old myocardial infarction: Secondary | ICD-10-CM | POA: Insufficient documentation

## 2013-12-19 DIAGNOSIS — Z8505 Personal history of malignant neoplasm of liver: Secondary | ICD-10-CM | POA: Insufficient documentation

## 2013-12-19 DIAGNOSIS — Z4889 Encounter for other specified surgical aftercare: Secondary | ICD-10-CM

## 2013-12-19 DIAGNOSIS — Z79899 Other long term (current) drug therapy: Secondary | ICD-10-CM | POA: Insufficient documentation

## 2013-12-19 DIAGNOSIS — Z4801 Encounter for change or removal of surgical wound dressing: Secondary | ICD-10-CM | POA: Insufficient documentation

## 2013-12-19 DIAGNOSIS — Z85118 Personal history of other malignant neoplasm of bronchus and lung: Secondary | ICD-10-CM | POA: Insufficient documentation

## 2013-12-19 DIAGNOSIS — Z8739 Personal history of other diseases of the musculoskeletal system and connective tissue: Secondary | ICD-10-CM | POA: Insufficient documentation

## 2013-12-19 DIAGNOSIS — F319 Bipolar disorder, unspecified: Secondary | ICD-10-CM | POA: Insufficient documentation

## 2013-12-19 DIAGNOSIS — I2510 Atherosclerotic heart disease of native coronary artery without angina pectoris: Secondary | ICD-10-CM | POA: Insufficient documentation

## 2013-12-19 DIAGNOSIS — I10 Essential (primary) hypertension: Secondary | ICD-10-CM | POA: Insufficient documentation

## 2013-12-19 DIAGNOSIS — K219 Gastro-esophageal reflux disease without esophagitis: Secondary | ICD-10-CM | POA: Insufficient documentation

## 2013-12-19 DIAGNOSIS — R109 Unspecified abdominal pain: Secondary | ICD-10-CM | POA: Insufficient documentation

## 2013-12-19 DIAGNOSIS — Z8543 Personal history of malignant neoplasm of ovary: Secondary | ICD-10-CM | POA: Insufficient documentation

## 2013-12-19 LAB — CBC WITH DIFFERENTIAL/PLATELET
BASOS PCT: 1 % (ref 0–1)
Basophils Absolute: 0.1 10*3/uL (ref 0.0–0.1)
Eosinophils Absolute: 0.5 10*3/uL (ref 0.0–0.7)
Eosinophils Relative: 5 % (ref 0–5)
HCT: 41.3 % (ref 36.0–46.0)
HEMOGLOBIN: 13.9 g/dL (ref 12.0–15.0)
Lymphocytes Relative: 22 % (ref 12–46)
Lymphs Abs: 2.2 10*3/uL (ref 0.7–4.0)
MCH: 30.2 pg (ref 26.0–34.0)
MCHC: 33.7 g/dL (ref 30.0–36.0)
MCV: 89.6 fL (ref 78.0–100.0)
MONOS PCT: 9 % (ref 3–12)
Monocytes Absolute: 0.9 10*3/uL (ref 0.1–1.0)
NEUTROS PCT: 63 % (ref 43–77)
Neutro Abs: 6.4 10*3/uL (ref 1.7–7.7)
Platelets: 412 10*3/uL — ABNORMAL HIGH (ref 150–400)
RBC: 4.61 MIL/uL (ref 3.87–5.11)
RDW: 15.2 % (ref 11.5–15.5)
WBC: 10.2 10*3/uL (ref 4.0–10.5)

## 2013-12-19 MED ORDER — IOHEXOL 300 MG/ML  SOLN
100.0000 mL | Freq: Once | INTRAMUSCULAR | Status: AC | PRN
  Administered 2013-12-19: 100 mL via INTRAVENOUS

## 2013-12-19 MED ORDER — IOHEXOL 300 MG/ML  SOLN
25.0000 mL | Freq: Once | INTRAMUSCULAR | Status: AC | PRN
  Administered 2013-12-19 (×2): 25 mL via ORAL

## 2013-12-19 NOTE — Progress Notes (Signed)
Patient c/o pain with minimal (less that 3 ml) of saline when flushing her IV for CT scan. Due to the high rate and volume needed for CT contrast, a new #22g Nexiva catheter was placed by this writer in her Left AC and left in place post procedure

## 2013-12-19 NOTE — ED Notes (Signed)
Returned from Ct scan  

## 2013-12-19 NOTE — Discharge Instructions (Signed)
Please continue wound care and follow up as previously scheduled.
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
## 2013-12-22 NOTE — Telephone Encounter (Signed)
Spoke with Dr. Fredrik Cove who just had some general questions about pt. Still plan to see pt in clinic Wednesday.

## 2013-12-22 NOTE — Telephone Encounter (Signed)
No answer, could not leave message. 

## 2013-12-24 ENCOUNTER — Encounter (INDEPENDENT_AMBULATORY_CARE_PROVIDER_SITE_OTHER): Payer: Self-pay

## 2013-12-24 ENCOUNTER — Encounter (INDEPENDENT_AMBULATORY_CARE_PROVIDER_SITE_OTHER): Payer: Medicaid Other

## 2014-05-25 ENCOUNTER — Encounter (HOSPITAL_COMMUNITY): Payer: Self-pay | Admitting: Emergency Medicine
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
## 2014-11-15 NOTE — Consult Note (Signed)
Brief Consult Note: Diagnosis: depression nos.   Patient was seen by consultant.   Recommend further assessment or treatment.   Orders entered.   Comments: PAtient seen. Will admit to psychiatry. Orders and admission note done.  Electronic Signatures: Gonzella Lex (MD)  (Signed 29-Mar-13 11:07)  Authored: Brief Consult Note   Last Updated: 29-Mar-13 11:07 by Gonzella Lex (MD)
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
## 2014-11-15 NOTE — Consult Note (Signed)
  Psychological Assessment  Cynda Familia Evaluation: 4-12-13Administered: [place] (MMPI-2) for Referral: Ms. Deemer was referred for a psychological assessment by her physician, Cephus Shelling, MD. She was admitted to [facility] for treatment of suicidal ideation. She has a history of substance abuse/dependence. Please see the history and physical and psychosocial history for further background information. An assessment of personality structure was requested. Ms. Badour MMPI-2 protocol is compared to that of other adult females she obtained the following profile: [id number] 90/:#. The MMPI-2 validity scales indicate that the clinical profile is valid. They also suggest that she has adjusted to the experience of long standing problems. PresentationShe reports that she is not experiencing any type of emotional distress and describes herself as being happy most of the time. She is inclined to take things hard. She believes she is no more nervous than most others. It makes her angry when people hurry her and she lets them know how she feels about it. She has become so angry that she feels as though she will explode.  She reports that her attention, concentration and memory seem to be all right. She has strong opinions that she expresses directly to other people. She likes to let people know where she stands on things and she finds it necessary to stand up for what she thinks is right or if people do something that makes her angry. At times she thinks that she can make up her mind with unusually great ease. She thinks that most people will use unfair means and stretch the truth to get ahead. She believes that it is safer to trust nobody. She often thinks that she is being punished without cause. She makes decisions easily. She is apt to take disappointments so keenly that she cannot put them out of her mind. She knows who is responsible for her troubles.   Relations: She reports a balance between extroverted and introverted behavior. She is very sociable and makes friends quickly. She enjoys social gatherings and parties and the excitement of a crowd. She likes making decisions and assigning jobs to others and believes, if given the chance, she would make a good leader of people. Problem Areas: She reports a wide variety of physical symptoms. However, during the past few years she has been well most of the time. She has difficulty going to sleep because thoughts or ideas are bothering her. She is easily awakened by noise. She feels tired a good deal of the time but she usually has enough energy to do her work. Her prognosis is guarded because of her reluctance to consider that she may play a role in her current problems. Short-term, behavioral interventions that focus on her reasons for entering treatment will be most effective. are a number of specific issues that must be kept in mind when establishing and maintaining the therapeutic alliance: she believes it is safer to trust nobody, she is very stubborn and she is reluctant to open up to others and is frequently described as being guarded and resistant  Impression:Disorder NOSof Substance DependenceDisorder NOS with borderline features   Electronic Signatures: Garald Braver (PsyD, HSP-P)  (Signed on 15-Apr-13 14:31)  Authored  Last Updated: 15-Apr-13 14:31 by Garald Braver (PsyD, HSP-P)
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
## 2015-05-20 IMAGING — CR DG CHEST 2V
2 series · 2 of 2 positions shown · non-contrast
Comparison: DG RIBS UNILATERAL W/CHEST*L* dated 11/22/2011

CLINICAL DATA: Chest pain.

EXAM:
CHEST  2 VIEW

[view not recorded (1 of 2)]
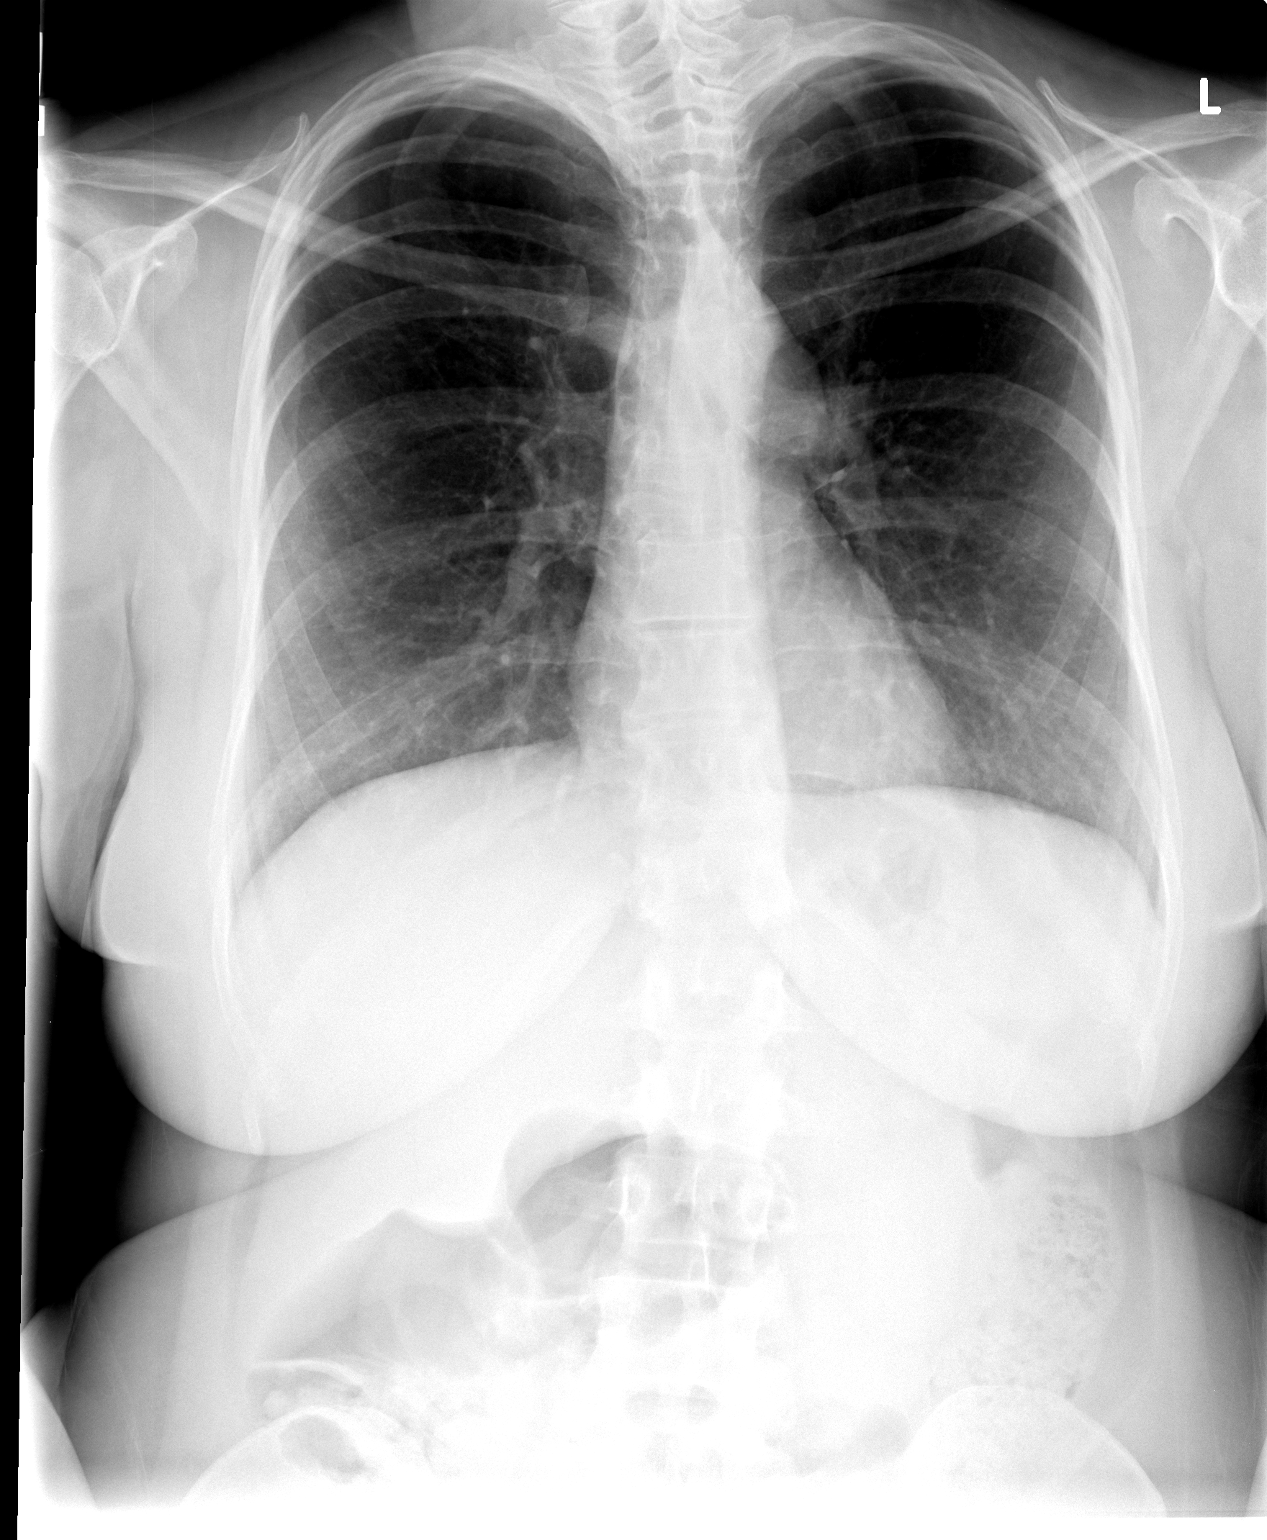

[view not recorded (2 of 2)]
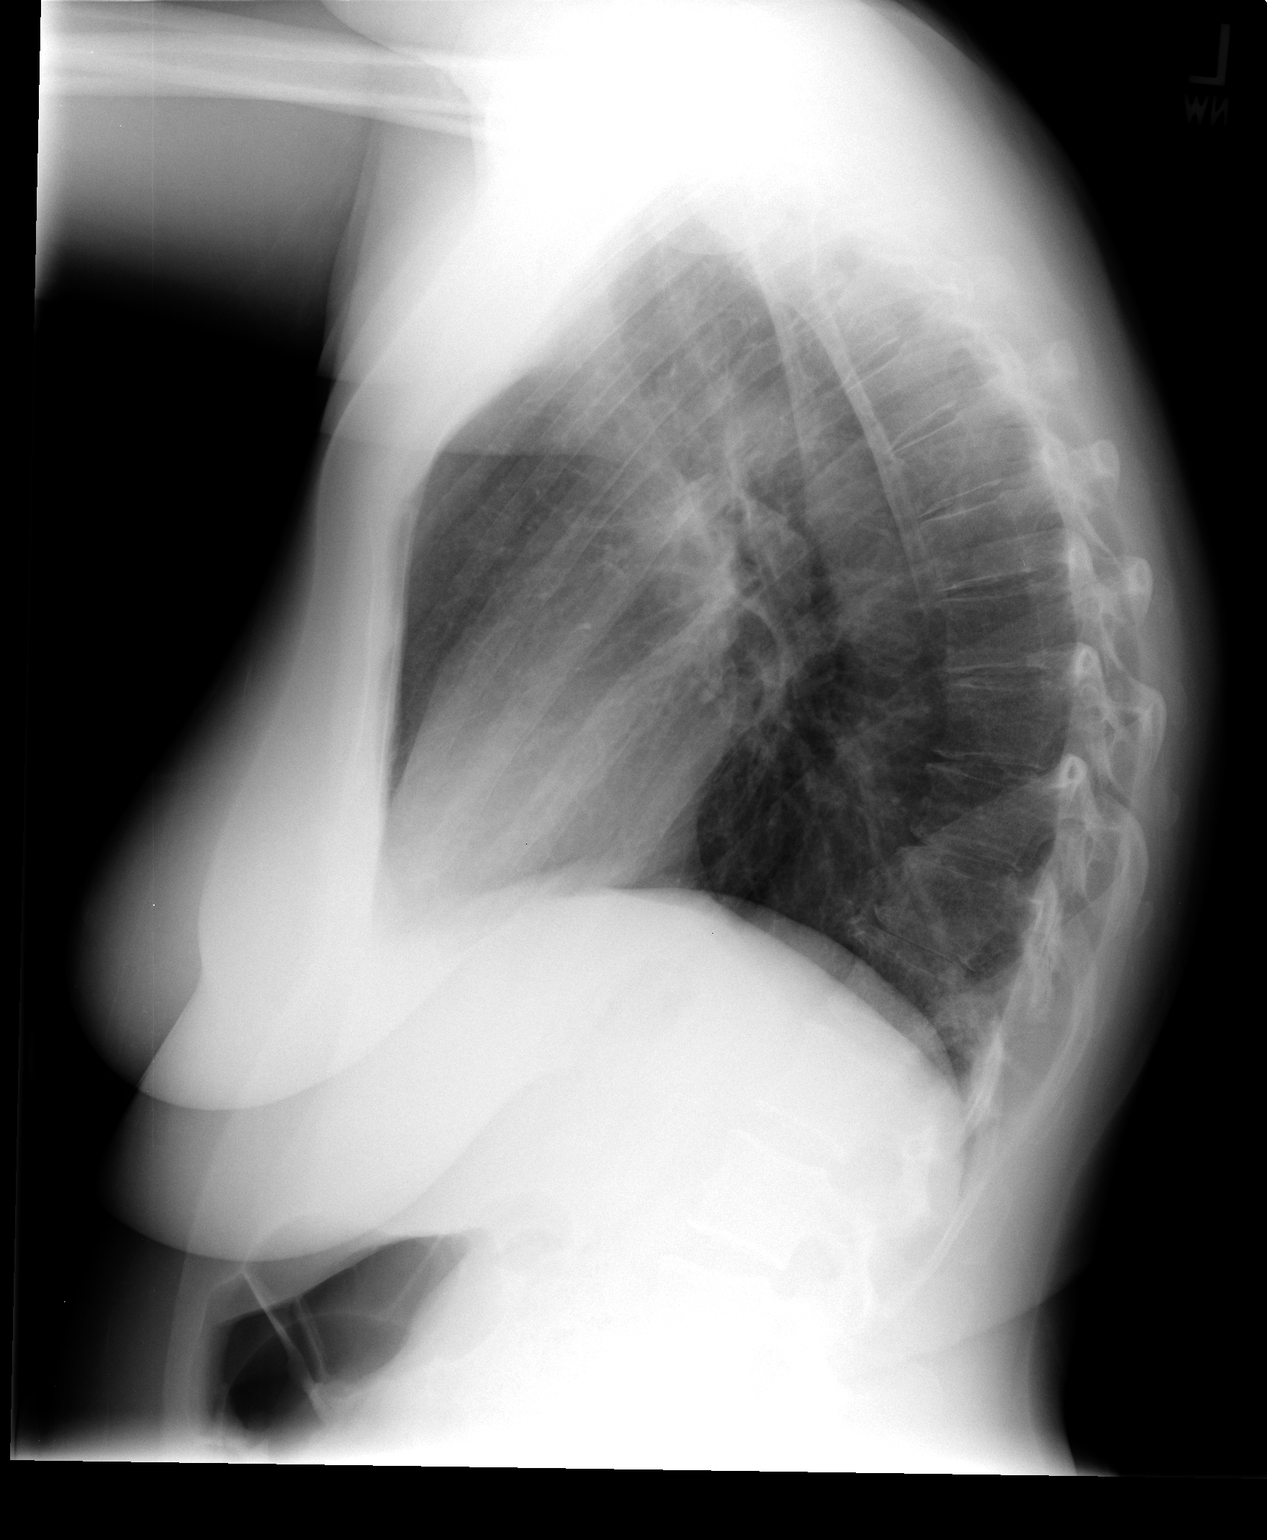

[2 of 2 positions shown; findings below may reference images not displayed]

FINDINGS: The heart size and mediastinal contours are within normal limits.
Both lungs are clear. The visualized skeletal structures are
unremarkable.
IMPRESSION: No active cardiopulmonary disease.

  By: Keith Duffy
